# Patient Record
Sex: Female | Born: 1970 | Race: Black or African American | Hispanic: No | Marital: Married | Smoking: Never smoker
Health system: Southern US, Community
[De-identification: ages and names within clinical notes are randomized; demographics above are authoritative.]

## PROBLEM LIST (undated history)

## (undated) DIAGNOSIS — I639 Cerebral infarction, unspecified: Secondary | ICD-10-CM

## (undated) DIAGNOSIS — I10 Essential (primary) hypertension: Secondary | ICD-10-CM

## (undated) DIAGNOSIS — I1A0 Resistant hypertension: Secondary | ICD-10-CM

## (undated) DIAGNOSIS — F329 Major depressive disorder, single episode, unspecified: Secondary | ICD-10-CM

## (undated) DIAGNOSIS — L509 Urticaria, unspecified: Secondary | ICD-10-CM

## (undated) DIAGNOSIS — G8929 Other chronic pain: Secondary | ICD-10-CM

## (undated) DIAGNOSIS — Z8742 Personal history of other diseases of the female genital tract: Secondary | ICD-10-CM

## (undated) DIAGNOSIS — F32A Depression, unspecified: Secondary | ICD-10-CM

## (undated) DIAGNOSIS — D219 Benign neoplasm of connective and other soft tissue, unspecified: Secondary | ICD-10-CM

## (undated) DIAGNOSIS — N839 Noninflammatory disorder of ovary, fallopian tube and broad ligament, unspecified: Secondary | ICD-10-CM

## (undated) DIAGNOSIS — U099 Post covid-19 condition, unspecified: Secondary | ICD-10-CM

## (undated) DIAGNOSIS — E039 Hypothyroidism, unspecified: Secondary | ICD-10-CM

## (undated) DIAGNOSIS — O24419 Gestational diabetes mellitus in pregnancy, unspecified control: Secondary | ICD-10-CM

## (undated) DIAGNOSIS — G43909 Migraine, unspecified, not intractable, without status migrainosus: Secondary | ICD-10-CM

## (undated) DIAGNOSIS — K219 Gastro-esophageal reflux disease without esophagitis: Secondary | ICD-10-CM

## (undated) DIAGNOSIS — T783XXA Angioneurotic edema, initial encounter: Secondary | ICD-10-CM

## (undated) DIAGNOSIS — J45909 Unspecified asthma, uncomplicated: Secondary | ICD-10-CM

## (undated) HISTORY — DX: Unspecified asthma, uncomplicated: J45.909

## (undated) HISTORY — PX: BUNIONECTOMY: SHX129

## (undated) HISTORY — DX: Angioneurotic edema, initial encounter: T78.3XXA

## (undated) HISTORY — DX: Noninflammatory disorder of ovary, fallopian tube and broad ligament, unspecified: N83.9

## (undated) HISTORY — DX: Personal history of other diseases of the female genital tract: Z87.42

## (undated) HISTORY — DX: Resistant hypertension: I1A.0

## (undated) HISTORY — DX: Major depressive disorder, single episode, unspecified: F32.9

## (undated) HISTORY — DX: Depression, unspecified: F32.A

## (undated) HISTORY — DX: Migraine, unspecified, not intractable, without status migrainosus: G43.909

## (undated) HISTORY — DX: Urticaria, unspecified: L50.9

## (undated) HISTORY — DX: Gastro-esophageal reflux disease without esophagitis: K21.9

---

## 1991-03-26 HISTORY — PX: SALPINGECTOMY: SHX328

## 1998-07-12 ENCOUNTER — Ambulatory Visit (HOSPITAL_COMMUNITY): Admission: RE | Admit: 1998-07-12 | Discharge: 1998-07-12 | Payer: Self-pay | Admitting: Family Medicine

## 1998-07-12 ENCOUNTER — Encounter: Payer: Self-pay | Admitting: Family Medicine

## 1999-01-07 ENCOUNTER — Emergency Department (HOSPITAL_COMMUNITY): Admission: EM | Admit: 1999-01-07 | Discharge: 1999-01-07 | Payer: Self-pay | Admitting: Emergency Medicine

## 1999-01-07 ENCOUNTER — Encounter: Payer: Self-pay | Admitting: Emergency Medicine

## 2000-06-15 ENCOUNTER — Emergency Department (HOSPITAL_COMMUNITY): Admission: EM | Admit: 2000-06-15 | Discharge: 2000-06-15 | Payer: Self-pay | Admitting: Emergency Medicine

## 2000-06-16 ENCOUNTER — Encounter: Payer: Self-pay | Admitting: Emergency Medicine

## 2000-06-19 ENCOUNTER — Inpatient Hospital Stay (HOSPITAL_COMMUNITY): Admission: AD | Admit: 2000-06-19 | Discharge: 2000-06-19 | Payer: Self-pay | Admitting: Obstetrics & Gynecology

## 2000-06-23 ENCOUNTER — Other Ambulatory Visit: Admission: RE | Admit: 2000-06-23 | Discharge: 2000-06-23 | Payer: Self-pay | Admitting: Obstetrics & Gynecology

## 2000-09-23 ENCOUNTER — Ambulatory Visit (HOSPITAL_BASED_OUTPATIENT_CLINIC_OR_DEPARTMENT_OTHER): Admission: RE | Admit: 2000-09-23 | Discharge: 2000-09-23 | Payer: Self-pay | Admitting: General Surgery

## 2000-09-23 ENCOUNTER — Encounter (INDEPENDENT_AMBULATORY_CARE_PROVIDER_SITE_OTHER): Payer: Self-pay | Admitting: *Deleted

## 2002-04-20 ENCOUNTER — Emergency Department (HOSPITAL_COMMUNITY): Admission: EM | Admit: 2002-04-20 | Discharge: 2002-04-20 | Payer: Self-pay | Admitting: Emergency Medicine

## 2002-08-07 ENCOUNTER — Encounter: Payer: Self-pay | Admitting: Emergency Medicine

## 2002-08-07 ENCOUNTER — Emergency Department (HOSPITAL_COMMUNITY): Admission: EM | Admit: 2002-08-07 | Discharge: 2002-08-07 | Payer: Self-pay | Admitting: Emergency Medicine

## 2004-03-25 HISTORY — PX: HAND SURGERY: SHX662

## 2005-05-08 ENCOUNTER — Other Ambulatory Visit: Admission: RE | Admit: 2005-05-08 | Discharge: 2005-05-08 | Payer: Self-pay | Admitting: Internal Medicine

## 2006-05-03 ENCOUNTER — Emergency Department (HOSPITAL_COMMUNITY): Admission: EM | Admit: 2006-05-03 | Discharge: 2006-05-03 | Payer: Self-pay | Admitting: Emergency Medicine

## 2006-08-26 ENCOUNTER — Emergency Department (HOSPITAL_COMMUNITY): Admission: EM | Admit: 2006-08-26 | Discharge: 2006-08-26 | Payer: Self-pay | Admitting: Family Medicine

## 2006-10-01 ENCOUNTER — Emergency Department (HOSPITAL_COMMUNITY): Admission: EM | Admit: 2006-10-01 | Discharge: 2006-10-01 | Payer: Self-pay | Admitting: Family Medicine

## 2006-10-06 ENCOUNTER — Emergency Department (HOSPITAL_COMMUNITY): Admission: EM | Admit: 2006-10-06 | Discharge: 2006-10-06 | Payer: Self-pay | Admitting: Family Medicine

## 2006-10-10 ENCOUNTER — Other Ambulatory Visit: Admission: RE | Admit: 2006-10-10 | Discharge: 2006-10-10 | Payer: Self-pay | Admitting: Internal Medicine

## 2006-10-20 ENCOUNTER — Encounter: Admission: RE | Admit: 2006-10-20 | Discharge: 2006-10-20 | Payer: Self-pay | Admitting: Internal Medicine

## 2007-04-23 ENCOUNTER — Emergency Department (HOSPITAL_COMMUNITY): Admission: EM | Admit: 2007-04-23 | Discharge: 2007-04-23 | Payer: Self-pay | Admitting: Emergency Medicine

## 2008-03-29 ENCOUNTER — Ambulatory Visit: Payer: Self-pay | Admitting: Gynecology

## 2008-04-12 ENCOUNTER — Ambulatory Visit: Payer: Self-pay | Admitting: Gynecology

## 2008-07-05 ENCOUNTER — Emergency Department (HOSPITAL_COMMUNITY): Admission: EM | Admit: 2008-07-05 | Discharge: 2008-07-05 | Payer: Self-pay | Admitting: Emergency Medicine

## 2008-12-22 ENCOUNTER — Encounter: Admission: RE | Admit: 2008-12-22 | Discharge: 2008-12-22 | Payer: Self-pay | Admitting: Internal Medicine

## 2009-01-29 ENCOUNTER — Emergency Department (HOSPITAL_COMMUNITY): Admission: EM | Admit: 2009-01-29 | Discharge: 2009-01-29 | Payer: Self-pay | Admitting: Emergency Medicine

## 2009-02-23 ENCOUNTER — Other Ambulatory Visit: Admission: RE | Admit: 2009-02-23 | Discharge: 2009-02-23 | Payer: Self-pay | Admitting: Internal Medicine

## 2009-06-27 ENCOUNTER — Emergency Department (HOSPITAL_COMMUNITY): Admission: EM | Admit: 2009-06-27 | Discharge: 2009-06-27 | Payer: Self-pay | Admitting: Emergency Medicine

## 2009-06-30 ENCOUNTER — Ambulatory Visit (HOSPITAL_BASED_OUTPATIENT_CLINIC_OR_DEPARTMENT_OTHER): Admission: RE | Admit: 2009-06-30 | Discharge: 2009-06-30 | Payer: Self-pay | Admitting: Orthopedic Surgery

## 2010-04-15 ENCOUNTER — Encounter: Payer: Self-pay | Admitting: Internal Medicine

## 2010-06-13 LAB — BASIC METABOLIC PANEL
BUN: 9 mg/dL (ref 6–23)
Calcium: 9.1 mg/dL (ref 8.4–10.5)
GFR calc non Af Amer: >60 mL/min (ref >60)
Potassium: 3.5 mEq/L (ref 3.5–5.1)

## 2010-08-10 NOTE — Op Note (Signed)
[place]  Patient:    April Dyer, April Dyer                          MRN: [id number] Proc. Date: 09/23/00 Adm. Date:  [id number] Attending:  Glenna Fellows Tappan                           Operative Report  PREOPERATIVE DIAGNOSIS:  Right breast mass.  POSTOPERATIVE DIAGNOSIS:  Right breast mass.  PROCEDURE:  Right breast biopsy.  SURGEON:  Lorne Skeens. Hoxworth, M.D.  ANESTHESIA:  Local with IV sedation.  BRIEF HISTORY:  April Dyer is a 40 year old black female who recently presented with a palpable nodule on the lateral breast.  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  Excisional biopsy has been recommended and accepted.  The nature of the procedure, its indications, and risks of bleeding and infection were discussed and understood preoperatively. She is now brought into the operating room for this procedure.  DESCRIPTION OF PROCEDURE:  The patient was brought to the operating room and placed in the supine position on the operating table and IV sedation administered.  The right breast was sterilely prepped and draped.  Local anesthesia was used to infiltrate the skin and underlying soft tissue.  A small incision was made in a skin crease directly over the palpable mass, which was quite superficial, and dissection carried down through the subcutaneous tissue.  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  Hemostasis was obtained with cautery.  The skin was closed with running subcuticular 5-0 Monocryl and Steri-Strips.  Sponge and needle counts were correct.  A dressing was applied and the patient taken to the recovery room good condition. DD:  09/23/00 TD:  09/23/00 Job: [id number]

## 2011-01-08 LAB — POCT URINALYSIS DIP (DEVICE)
Glucose, UA: NEGATIVE
Hgb urine dipstick: NEGATIVE
Nitrite: NEGATIVE
Protein, ur: NEGATIVE
Urobilinogen, UA: 0.2
pH: 5.5

## 2011-01-08 LAB — WET PREP, GENITAL
Trich, Wet Prep: NONE SEEN
Yeast Wet Prep HPF POC: NONE SEEN

## 2011-01-08 LAB — POCT PREGNANCY, URINE

## 2011-01-10 LAB — HEPATIC FUNCTION PANEL
ALT: 13
AST: 20
Indirect Bilirubin: 0.3
Total Protein: 6.7

## 2011-01-10 LAB — URINALYSIS, ROUTINE W REFLEX MICROSCOPIC
Glucose, UA: NEGATIVE
Ketones, ur: NEGATIVE
Nitrite: NEGATIVE
Protein, ur: NEGATIVE
pH: 5.5

## 2011-01-10 LAB — BASIC METABOLIC PANEL
Calcium: 9.2
GFR calc non Af Amer: >60
Potassium: 3.7
Sodium: 139

## 2011-01-10 LAB — GAMMA GT: GGT: 16

## 2011-01-10 LAB — ETHANOL

## 2011-01-10 LAB — POCT CARDIAC MARKERS
Operator id: 146091
Troponin i, poc: <0.05

## 2011-01-10 LAB — DRUG SCREEN PANEL (SERUM)
Benzodiazepine Scrn: NEGATIVE
Cocaine (Metabolite): NEGATIVE

## 2011-10-07 ENCOUNTER — Encounter (HOSPITAL_COMMUNITY): Payer: Self-pay | Admitting: *Deleted

## 2011-10-07 ENCOUNTER — Emergency Department (HOSPITAL_COMMUNITY)
Admission: EM | Admit: 2011-10-07 | Discharge: 2011-10-08 | Disposition: A | Discharge status: Home / Self Care | Payer: Self-pay | Attending: Emergency Medicine | Admitting: Emergency Medicine

## 2011-10-07 DIAGNOSIS — I10 Essential (primary) hypertension: Secondary | ICD-10-CM | POA: Insufficient documentation

## 2011-10-07 DIAGNOSIS — R0781 Pleurodynia: Secondary | ICD-10-CM

## 2011-10-07 DIAGNOSIS — R079 Chest pain, unspecified: Secondary | ICD-10-CM | POA: Insufficient documentation

## 2011-10-07 DIAGNOSIS — Z79899 Other long term (current) drug therapy: Secondary | ICD-10-CM | POA: Insufficient documentation

## 2011-10-07 HISTORY — DX: Essential (primary) hypertension: I10

## 2011-10-07 LAB — COMPREHENSIVE METABOLIC PANEL
ALT: 13 U/L (ref 0–35)
BUN: 14 mg/dL (ref 6–23)
CO2: 24 mEq/L (ref 19–32)
Calcium: 9.4 mg/dL (ref 8.4–10.5)
Creatinine, Ser: 0.89 mg/dL (ref 0.50–1.10)
GFR calc Af Amer: >90 mL/min (ref >90)
GFR calc non Af Amer: 79 mL/min — ABNORMAL LOW (ref >90)
Glucose, Bld: 89 mg/dL (ref 70–99)
Total Protein: 7.6 g/dL (ref 6.0–8.3)

## 2011-10-07 LAB — CBC
HCT: 36.9 % (ref 36.0–46.0)
Hemoglobin: 12.7 g/dL (ref 12.0–15.0)
MCH: 33 pg (ref 26.0–34.0)
MCHC: 34.4 g/dL (ref 30.0–36.0)
MCV: 95.8 fL (ref 78.0–100.0)
RBC: 3.85 MIL/uL — ABNORMAL LOW (ref 3.87–5.11)

## 2011-10-07 LAB — URINALYSIS, ROUTINE W REFLEX MICROSCOPIC
Bilirubin Urine: NEGATIVE
Hgb urine dipstick: NEGATIVE
Ketones, ur: NEGATIVE mg/dL
Nitrite: NEGATIVE
Specific Gravity, Urine: 1.018 (ref 1.005–1.030)
pH: 6 (ref 5.0–8.0)

## 2011-10-07 MED ORDER — SODIUM CHLORIDE 0.9 % IV BOLUS (SEPSIS)
1000.0000 mL | Freq: Once | INTRAVENOUS | Status: AC
  Administered 2011-10-08: 1000 mL via INTRAVENOUS

## 2011-10-07 NOTE — ED Notes (Signed)
Patient has been dizzy for about a week and her arms are tingling.  Patient also complains of abdominal for about three months (upper abdomen) and it has been getting worse.  Patient's had a bowel movement a few days ago.  Patient feels bloated and her abdomen is distended than normal

## 2011-10-08 ENCOUNTER — Emergency Department (HOSPITAL_COMMUNITY): Payer: Self-pay

## 2011-10-08 MED ORDER — MORPHINE SULFATE 4 MG/ML IJ SOLN
4.0000 mg | Freq: Once | INTRAMUSCULAR | Status: AC
  Administered 2011-10-08: 4 mg via INTRAVENOUS
  Filled 2011-10-08: qty 1

## 2011-10-08 MED ORDER — KETOROLAC TROMETHAMINE 30 MG/ML IJ SOLN
30.0000 mg | Freq: Once | INTRAMUSCULAR | Status: AC
  Administered 2011-10-08: 30 mg via INTRAVENOUS
  Filled 2011-10-08: qty 1

## 2011-10-08 MED ORDER — PREDNISONE 50 MG PO TABS: 50.0000 mg | ORAL_TABLET | Freq: Every day | ORAL | Status: AC

## 2011-10-08 MED ORDER — HYDROCODONE-ACETAMINOPHEN 5-325 MG PO TABS: 1.0000 | ORAL_TABLET | Freq: Four times a day (QID) | ORAL | Status: AC | PRN

## 2011-10-08 NOTE — ED Provider Notes (Signed)
History     CSN: [id number]  Arrival date & time 10/07/11  2151   First MD Initiated Contact with Patient 10/07/11 2350      No chief complaint on file.   (Consider location/radiation/quality/duration/timing/severity/associated sxs/prior treatment) HPI Recent presents emergency Dept. with rib pain and back pain for the last 3 months.  Patient, states, that it is not her abdomen it is actually her ribs.  Patient, states that she had some tingling in her arm and foot yesterday.  Patient also, states she has had some mild dizziness at times over the last few weeks.  She denies, shortness of breath, chest pain, nausea, vomiting, diarrhea, fever, cough, syncope, or visual changes.  Past Medical History  Diagnosis Date  . Hypertension     History reviewed. No pertinent past surgical history.  History reviewed. No pertinent family history.  History  Substance Use Topics  . Smoking status: Never Smoker   . Smokeless tobacco: Not on file  . Alcohol Use: No    OB History    Grav Para Term Preterm Abortions TAB SAB Ect Mult Living                  Review of Systems All other systems negative except as documented in the HPI. All pertinent positives and negatives as reviewed in the HPI.  Allergies  Review of patient's allergies indicates no known allergies.  Home Medications   Current Outpatient Rx  Name Route Sig Dispense Refill  . AMLODIPINE BESYLATE 10 MG PO TABS Oral Take 10 mg by mouth daily.    Marland Kitchen BAYER BACK & BODY PAIN EX ST PO Oral Take 2 tablets by mouth as needed. For pain    . HYDROCHLOROTHIAZIDE 25 MG PO TABS Oral Take 25 mg by mouth daily.    Marland Kitchen PRENATAL 27-0.8 MG PO TABS Oral Take 1 tablet by mouth daily.      BP 161/102  Pulse 72  Temp 98.3 F (36.8 C) (Oral)  Resp 16  SpO2 98%  LMP 10/05/2011  Physical Exam  Nursing note and vitals reviewed. Constitutional: She appears well-developed and well-nourished.  HENT:  Head: Normocephalic and atraumatic.    Mouth/Throat: Oropharynx is clear and moist.  Eyes: Pupils are equal, round, and reactive to light.  Cardiovascular: Normal rate and regular rhythm.  Exam reveals no gallop and no friction rub.   No murmur heard. Pulmonary/Chest: Effort normal and breath sounds normal. No respiratory distress. She exhibits tenderness.    Abdominal: Soft. Bowel sounds are normal. She exhibits no distension. There is no tenderness. There is no rebound and no guarding.  Skin: Skin is warm and dry.    ED Course  Procedures (including critical care time)  Labs Reviewed  CBC - Abnormal; Notable for the following:    RBC 3.85 (*)     All other components within normal limits  COMPREHENSIVE METABOLIC PANEL - Abnormal; Notable for the following:    Potassium 3.3 (*)     Total Bilirubin 0.2 (*)     GFR calc non Af Amer 79 (*)     All other components within normal limits  URINALYSIS, ROUTINE W REFLEX MICROSCOPIC  POCT PREGNANCY, URINE   The patient will be treated for chest wall discomfort based on her bilateral rib pain. The patient is advised to return here as needed. Follow up with her doctor for a recheck. The patient has not have abdominal pain here.    MDM  MDM Reviewed: nursing note,  vitals and previous chart Interpretation: labs and x-ray            Carlyle Dolly, PA-C 10/08/11 0059

## 2011-10-10 NOTE — ED Provider Notes (Signed)
Medical screening examination/treatment/procedure(s) were performed by non-physician practitioner and as supervising physician I was immediately available for consultation/collaboration.   Shelda Jakes, MD 10/10/11 2156

## 2011-11-04 ENCOUNTER — Ambulatory Visit (INDEPENDENT_AMBULATORY_CARE_PROVIDER_SITE_OTHER): Payer: Self-pay | Admitting: Family Medicine

## 2011-11-04 ENCOUNTER — Encounter: Payer: Self-pay | Admitting: Family Medicine

## 2011-11-04 VITALS — BP 142/96 | HR 85 | Temp 97.6°F | Ht 67.5 in | Wt 196.0 lb

## 2011-11-04 DIAGNOSIS — F32A Depression, unspecified: Secondary | ICD-10-CM | POA: Insufficient documentation

## 2011-11-04 DIAGNOSIS — G43909 Migraine, unspecified, not intractable, without status migrainosus: Secondary | ICD-10-CM

## 2011-11-04 DIAGNOSIS — E669 Obesity, unspecified: Secondary | ICD-10-CM

## 2011-11-04 DIAGNOSIS — K219 Gastro-esophageal reflux disease without esophagitis: Secondary | ICD-10-CM

## 2011-11-04 DIAGNOSIS — I10 Essential (primary) hypertension: Secondary | ICD-10-CM

## 2011-11-04 DIAGNOSIS — R0781 Pleurodynia: Secondary | ICD-10-CM

## 2011-11-04 DIAGNOSIS — N839 Noninflammatory disorder of ovary, fallopian tube and broad ligament, unspecified: Secondary | ICD-10-CM

## 2011-11-04 DIAGNOSIS — I1A0 Resistant hypertension: Secondary | ICD-10-CM | POA: Insufficient documentation

## 2011-11-04 DIAGNOSIS — M545 Low back pain, unspecified: Secondary | ICD-10-CM

## 2011-11-04 DIAGNOSIS — Z8742 Personal history of other diseases of the female genital tract: Secondary | ICD-10-CM

## 2011-11-04 DIAGNOSIS — Z87898 Personal history of other specified conditions: Secondary | ICD-10-CM | POA: Insufficient documentation

## 2011-11-04 DIAGNOSIS — R079 Chest pain, unspecified: Secondary | ICD-10-CM

## 2011-11-04 DIAGNOSIS — F329 Major depressive disorder, single episode, unspecified: Secondary | ICD-10-CM

## 2011-11-04 DIAGNOSIS — F3289 Other specified depressive episodes: Secondary | ICD-10-CM

## 2011-11-04 NOTE — Assessment & Plan Note (Signed)
Given history, i have concerns may be bipolar. Will complete comprehensive pscyh eval in 2 weeks. Will obtain records from previous PCP. Patient not interested in SSRI and do not think this would be safe to start without more comprehensive eval. Suspect patient will either need psychiatry or psychology eval as well.

## 2011-11-04 NOTE — Assessment & Plan Note (Signed)
Trial sports bra and icing BID daily for 1-2 months. Normal CXR within a month with no fractures noted. Continue to follow. May be weight related.

## 2011-11-04 NOTE — Patient Instructions (Signed)
Dear April Dyer,   It was great to meet you today. Thank you for coming to clinic. Please read below regarding the issues that we discussed.   1. For your rib pain, I want you to go back to wearing sports bras for at least 3 months. I also want you to try icing and/or heat at minimum twice a day. Since you are trying to get pregnant, using tylenol instead of NSAIDS.  2. For your back pain, you have set a goal to lose some weight. You set a goal of 5 lbs in 2 months. Our main obstacle is likely your slowed metabolic rate from eating so infrequently. I would like to see you back in 2 weeks to talk about your psychiatric history and see if we need to refer you.   My 5 to Fitness!  5: fruits and vegetables per day (work on 9 per day if you are at 5) 4: exercise 4-5 times per week for at least 30 minutes (walking counts!) 3: meals per day (don't skip breakfast!) 2: habits to quit  -smoking  -excess alcohol use (men >2 beer/day; women >1beer/day) 1: sweet per day (2 cookies, 1 small cup of ice cream, 12 oz soda)  These are general tips for healthy living. Try to start with 1 or 2 habit TODAY and make it a part of your life for several months. You set a goal today to work on: Eating 3 meals per day.   Once you have 1 or 2 habits down for several months, try to begin working on your next healthy habit. With every single step you take, you will be leading a healthier lifestyle!   Please call earlier if you have any questions or concerns.   Sincerely,  Dr. Tana Conch

## 2011-11-04 NOTE — Assessment & Plan Note (Signed)
Need to obtain records and ensure proper follow up.

## 2011-11-04 NOTE — Assessment & Plan Note (Signed)
Slowed metabolic rate due to poor PO appetite. May be related to depression/psych history-eval next visit. Strongly advised 3 meals a day-focused on trying to get patient to at least eat breakfast. Will likely need nutrition consult at next 1-2 visits. Goal weight loss 5 lbs in 2 months.

## 2011-11-04 NOTE — Assessment & Plan Note (Signed)
Obtain records. Encouraged patient to continue sexual activity for at least one year before more advanced assessment. Would consider TSH for fertility given obesity despite rigorous exercise.

## 2011-11-04 NOTE — Assessment & Plan Note (Signed)
MSK in nature. Advised weight loss per obesity section. Tylenol instead of bayer back as trying to get pregnant.
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
## 2011-11-05 ENCOUNTER — Encounter: Payer: Self-pay | Admitting: Family Medicine

## 2011-11-05 DIAGNOSIS — D259 Leiomyoma of uterus, unspecified: Secondary | ICD-10-CM | POA: Insufficient documentation

## 2011-11-05 DIAGNOSIS — N979 Female infertility, unspecified: Secondary | ICD-10-CM | POA: Insufficient documentation

## 2011-11-24 ENCOUNTER — Encounter: Payer: Self-pay | Admitting: Family Medicine

## 2011-11-24 DIAGNOSIS — N6310 Unspecified lump in the right breast, unspecified quadrant: Secondary | ICD-10-CM

## 2011-11-24 DIAGNOSIS — N6315 Unspecified lump in the right breast, overlapping quadrants: Secondary | ICD-10-CM | POA: Insufficient documentation

## 2011-12-02 ENCOUNTER — Ambulatory Visit: Payer: Self-pay | Admitting: Family Medicine

## 2011-12-02 ENCOUNTER — Encounter: Payer: Self-pay | Admitting: Family Medicine

## 2011-12-03 ENCOUNTER — Encounter: Payer: Self-pay | Admitting: Family Medicine

## 2011-12-03 ENCOUNTER — Ambulatory Visit (INDEPENDENT_AMBULATORY_CARE_PROVIDER_SITE_OTHER): Payer: Self-pay | Admitting: Family Medicine

## 2011-12-03 VITALS — BP 142/98 | HR 78 | Temp 98.0°F | Ht 67.5 in | Wt 198.0 lb

## 2011-12-03 DIAGNOSIS — R140 Abdominal distension (gaseous): Secondary | ICD-10-CM

## 2011-12-03 DIAGNOSIS — R141 Gas pain: Secondary | ICD-10-CM

## 2011-12-03 DIAGNOSIS — R5383 Other fatigue: Secondary | ICD-10-CM

## 2011-12-03 DIAGNOSIS — R5381 Other malaise: Secondary | ICD-10-CM

## 2011-12-03 DIAGNOSIS — R143 Flatulence: Secondary | ICD-10-CM

## 2011-12-03 NOTE — Patient Instructions (Addendum)
-  Try yogurt daily -Try to eat something (even a few bites) every few hours -Stay hydrated (even if it is a few sips every hour)  Follow-up in 2 weeks with Dr. Durene Cal

## 2011-12-03 NOTE — Progress Notes (Signed)
  Subjective:    Patient ID: April Dyer, female    DOB: [birth year], 41 y.o.   MRN: [id number]  HPI # Work-in visit: bloating, fatigue, difficulty sleeping, decreased appetite This has been going on for several months. She has been under a lot of stress (planning a wedding for April 2014, trying to get pregnant since since is of advanced age, going to school, working at a job she does not like).  She has a family history of thyroid disease (hyper and hypo). She was tested in the past but it was normal at that time.  She denies depression (feeling sad or hopeless). She denies anxiety.  It is very difficult for her to eat 3 times a day. She does not like to drink that much water either.   Review of Systems Denies chest pain Denies fevers, chills Denies abdominal pain, constipation. She has loose stools every 2-3 days. Denies blood in stool.  Denies emesis. She has nausea sometimes.   Allergies, medication, past medical history reviewed.  Significant for: -HTN -GERD -Migraines, rib pain, low back pain, depression -History of PID, fallopian tube disorder, abnormal Pap -Obesity     Objective:   Physical Exam GEN: NAD; overweight PSYCH: appears anxious; appropriate to questions ORAL: dry MM NECK: no thyromegaly or nodules palpable CV: RRR, no m/r/g PULM: NI WOB; CTAB ABD: NABS, soft, NT, distended versus obese EXT: mild non-pitting left pedal edema    Assessment & Plan:

## 2011-12-03 NOTE — Assessment & Plan Note (Signed)
She eats infrequently and does not seem to eat healthy when she does. She is also dehydrated.  -Encouraged eating something healthy every few hours, even if she only took bites -Try yogurt to see if it helps with the bloating -Encouraged hydration

## 2011-12-03 NOTE — Assessment & Plan Note (Signed)
-  Patient left, and I did not order TSH/free T4>>>Called patient and asked her to schedule a lab visit to have this checked.  -See above regarding "abdominal bloating". I think eating regularly will give her more energy. -Discussed starting an anti-depressant for her symptoms, although she does not have obvious depression, to see if it may help her insomnia/decreased appetite/fatgiue, however, patient declines at this time.  -Discussed ways to better manage her stress, including focusing on what is most important to her and letting other thing go.  -Follow-up with PCP in 2 weeks.

## 2011-12-10 ENCOUNTER — Other Ambulatory Visit: Payer: Self-pay

## 2011-12-10 ENCOUNTER — Ambulatory Visit: Payer: Self-pay | Admitting: Family Medicine

## 2011-12-10 DIAGNOSIS — R5383 Other fatigue: Secondary | ICD-10-CM

## 2011-12-10 LAB — T4, FREE: Free T4: 0.95 ng/dL (ref 0.80–1.80)

## 2011-12-10 LAB — TSH: TSH: 3.386 u[IU]/mL (ref 0.350–4.500)

## 2011-12-10 NOTE — Progress Notes (Signed)
TSH AND F-T4 DONE TODAY [name] 

## 2011-12-11 ENCOUNTER — Encounter: Payer: Self-pay | Admitting: Family Medicine

## 2011-12-17 ENCOUNTER — Encounter: Payer: Self-pay | Admitting: Family Medicine

## 2011-12-17 ENCOUNTER — Ambulatory Visit (INDEPENDENT_AMBULATORY_CARE_PROVIDER_SITE_OTHER): Payer: Self-pay | Admitting: Family Medicine

## 2011-12-17 VITALS — BP 129/88 | HR 89 | Temp 97.8°F | Ht 67.5 in | Wt 200.0 lb

## 2011-12-17 DIAGNOSIS — R079 Chest pain, unspecified: Secondary | ICD-10-CM

## 2011-12-17 DIAGNOSIS — L280 Lichen simplex chronicus: Secondary | ICD-10-CM | POA: Insufficient documentation

## 2011-12-17 DIAGNOSIS — R143 Flatulence: Secondary | ICD-10-CM

## 2011-12-17 DIAGNOSIS — R140 Abdominal distension (gaseous): Secondary | ICD-10-CM

## 2011-12-17 DIAGNOSIS — R0781 Pleurodynia: Secondary | ICD-10-CM

## 2011-12-17 DIAGNOSIS — R141 Gas pain: Secondary | ICD-10-CM

## 2011-12-17 MED ORDER — TRIAMCINOLONE ACETONIDE 0.1 % EX CREA: TOPICAL_CREAM | Freq: Two times a day (BID) | CUTANEOUS | Status: DC

## 2011-12-17 MED ORDER — POLYETHYLENE GLYCOL 3350 17 GM/SCOOP PO POWD: 17.0000 g | Freq: Every day | ORAL | Status: DC

## 2011-12-17 NOTE — Progress Notes (Signed)
Subjective:   1. Bloating-patient states she has cut out milk and started eating yogurt as Dr. Bluford Kaufmann park suggested. She attempted to eat 3 meals a day for 3 days and states she had more energy but just lost appetite even with small meals so she stopped. A week later is when she came in with worsening bloating. She states she cannot tolerate eating regular meals. She has been doing a better job of staying hydrated and eating small snacks such as grapes/strawberries throughout the day. She is getting full after eating 1/4 of a sub at this point and says overall appetite is low.   Patient states only having bowel movement every 3 days when used to be regular everyday. She has some concern that she may be constipated.   2. Rib pain-She feels like when she bloats that it makes the pain underneath her ribs worse. She has been using a sports bra with some slight improvement compared to under wire bra. Ice has given temporary relief and helped her with sleep btu she fell asleep with ice on so does not want to use that regularly.   3. Rash between breasts-patient not sure when this started but first noticed it within the last month. Some slightly darker areas that she has scratched at constantly  "trying to scratch it off" which has only made it worse. Says not necessarily itchy but she just wants to scratch it. No fever, new medications, oral involvement, overall sick appearance.   ROS--See HPI  Past Medical History-smoking status noted: nonsmoker.  Reviewed problem list.  Medications- reviewed and updated Chief complaint-noted  Objective: BP 129/88  Pulse 89  Temp 97.8 F (36.6 C) (Oral)  Ht 5' 7.5" (1.715 m)  Wt 200 lb (90.719 kg)  BMI 30.86 kg/m2  LMP 11/21/2011 Gen: NAD, alert and oriented x 3 Heent: MMM, PERRLA CV: RRR no mrg Lungs: CTAB Abd: soft nondistended. Slight tenderness in LLQ.  MSK: tender in Paraspinous muscles, normal range of motion. Patient extremely tender to palpation of  bottom 2 ribs bilaterally.  Skin-hyperpigmented raised scaly excoriated lesion between breasts and underneath left and right breast.  Assessment/Plan: See problem oriented charted  Patient needs to follow up very soon for repeat PAP smear with reflex testing-encouraged her to schedule appointment.

## 2011-12-17 NOTE — Assessment & Plan Note (Signed)
Concern for constipation as cause of bloating although irregular eating pattern could certainly be cause. Will trial Miralax and titrate up to 3x per day for daily BM to see if this improves pain.   Given change in bowel habits, would strongly consider colonoscopy. Unfortunately, patient has orange card so cannot get at this time unless pays out of pocket.

## 2011-12-17 NOTE — Assessment & Plan Note (Signed)
Patient thinks bloating makes this worse. Overall slightly better with sports bra. Will continue. Do not think bloating contributing but will trial helping through relieving possible constipation.

## 2011-12-17 NOTE — Patient Instructions (Addendum)
For the rash between your breasts, STOP SRcATHING IT!  Use the triamcinolone cream twice a day for at least 2 weeks. If it gets worse with the cream, please come back.   For your bloating which you think is associated with your rib pain and possibly constipation, let's try Miralax daily and increase as needed up to 3x to have a daily bowel movement.   Please schedule an appointment for a pap smear as soon as possible.   Please also consider an appointment to talk about your sleep issues and possible depression.

## 2011-12-17 NOTE — Assessment & Plan Note (Signed)
Encouraged patient to STOP scratching area. WIll trial triamcinolone as well.

## 2011-12-19 ENCOUNTER — Encounter: Payer: Self-pay | Admitting: Family Medicine

## 2011-12-19 ENCOUNTER — Ambulatory Visit (INDEPENDENT_AMBULATORY_CARE_PROVIDER_SITE_OTHER): Payer: Self-pay | Admitting: Family Medicine

## 2011-12-19 ENCOUNTER — Other Ambulatory Visit (HOSPITAL_COMMUNITY)
Admission: RE | Admit: 2011-12-19 | Discharge: 2011-12-19 | Disposition: A | Discharge status: Home / Self Care | Payer: Self-pay | Source: Ambulatory Visit | Attending: Family Medicine | Admitting: Family Medicine

## 2011-12-19 VITALS — BP 113/63 | HR 81 | Temp 98.1°F | Ht 67.5 in | Wt 203.0 lb

## 2011-12-19 DIAGNOSIS — L280 Lichen simplex chronicus: Secondary | ICD-10-CM

## 2011-12-19 DIAGNOSIS — Z23 Encounter for immunization: Secondary | ICD-10-CM

## 2011-12-19 DIAGNOSIS — Z0000 Encounter for general adult medical examination without abnormal findings: Secondary | ICD-10-CM

## 2011-12-19 DIAGNOSIS — Z01419 Encounter for gynecological examination (general) (routine) without abnormal findings: Secondary | ICD-10-CM | POA: Insufficient documentation

## 2011-12-19 DIAGNOSIS — Z87898 Personal history of other specified conditions: Secondary | ICD-10-CM

## 2011-12-19 DIAGNOSIS — Z1151 Encounter for screening for human papillomavirus (HPV): Secondary | ICD-10-CM | POA: Insufficient documentation

## 2011-12-19 DIAGNOSIS — Z124 Encounter for screening for malignant neoplasm of cervix: Secondary | ICD-10-CM

## 2011-12-19 DIAGNOSIS — Z8742 Personal history of other diseases of the female genital tract: Secondary | ICD-10-CM

## 2011-12-19 NOTE — Assessment & Plan Note (Signed)
Improving, continue triamcinolone

## 2011-12-19 NOTE — Assessment & Plan Note (Signed)
GIven TDAP today Discussed mammogram options and USPTF recommendations of starting at age 43 and doing every other year as well as other options. Patient opted to do every other year screening starting now with consideration of yearly after age 60.  Also as patient recently stopped drinking milk, encouraged supplement with MV with calcium and vitamin D Discussed STD testing but patient had at HD within 6 months and wants to defer until next CPE

## 2011-12-19 NOTE — Patient Instructions (Signed)
Thanks for coming in for your physical! I will either call or send a letter with results from your pap.  I would encourage you to take something with calcium and vitamin d since you are avoiding milk now. You could also strongly consider something with folic acid if you continue to try to get pregnant.  You deferred STD testing until your next physical.  Make sure to call to get your mammogram.  You got your TDAP today.   Thanks,  Dr. Durene Cal  My 5 to Fitness!  5: fruits and vegetables per day (work on 9 per day if you are at 5) 4: exercise 4-5 times per week for at least 30 minutes (walking counts!) 3: meals per day (don't skip breakfast!) 2: habits to quit -smoking -excess alcohol use (men >2 beer/day; women >1beer/day) 1: sweet per day (2 cookies, 1 small cup of ice cream, 12 oz soda)  These are general tips for healthy living. Try to start with 1 or 2 habit TODAY and make it a part of your life for several months.   Once you have 1 or 2 habits down for several months, try to begin working on your next healthy habit. With every single step you take, you will be leading a healthier lifestyle!

## 2011-12-19 NOTE — Assessment & Plan Note (Signed)
PAP smear today with cotest by nonreflex HPV testing. If ASCUS or less with HPV negative will repeat in 1 year. IF still ASCUS with HPV negative in 1 year, will repeat colpo in [facility] or refer to women's clinic. IF greater than ASCUS or HPV positive, [facility] colpo or women's clinic immediate referral.

## 2011-12-19 NOTE — Progress Notes (Signed)
Subjective:  Preventative Visit/CPE  1. History of abnormal pap-Managed at [facility] previously but has lost insurance and now on orange card. History of ASCUS 2010 with negative HPV. 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 Planned for follow up in 6 months presents today for pap.   2. Preventative- GIven TDAP today Discussed mammogram options and USPTF recommendations of starting at age 43 and doing every other year as well as other options. Patient opted to do every other year screening starting now with consideration of yearly after age 74.  Also as patient recently stopped drinking milk, encouraged supplement with MV with calcium and vitamin D Discussed STD testing but patient had at [facility] within 6 months and wants to defer until next CPE  3. Infertility-discussed increasing likelihood of infertility as well as increasing rates of Down's Syndrome. Patient still actively trying to pursue pregnancy for now. Encouraged folic acid while trying. Been trying for 9 months yet.   4. 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   ROS--See HPI  Past Medical History-smoking status noted: nonsmoker.  Reviewed problem list.  Medications- reviewed and updated Chief complaint-noted  Objective: BP 113/63  Pulse 81  Temp 98.1 F (36.7 C) (Oral)  Ht 5' 7.5" (1.715 m)  Wt 203 lb (92.08 kg)  BMI 31.32 kg/m2  LMP 11/15/2011 Gen: NAD CV: RRR no mrg Lungs: CTAB Pelvic: cervix normal in appearance, external genitalia normal, no adnexal masses or tenderness, no cervical motion tenderness, uterus normal size, shape, and consistency and vagina normal without discharge Skin: hyperpigmentation and scale in between breasts lessened in severity.   Assessment/Plan: See problem oriented charted

## 2011-12-31 ENCOUNTER — Encounter: Payer: Self-pay | Admitting: *Deleted

## 2011-12-31 ENCOUNTER — Encounter: Payer: Self-pay | Admitting: Family Medicine

## 2012-03-25 HISTORY — PX: MYOMECTOMY: SHX85

## 2012-07-02 ENCOUNTER — Encounter: Payer: Self-pay | Admitting: Obstetrics & Gynecology

## 2012-09-14 ENCOUNTER — Encounter: Payer: Self-pay | Admitting: Obstetrics & Gynecology

## 2012-09-14 ENCOUNTER — Ambulatory Visit (INDEPENDENT_AMBULATORY_CARE_PROVIDER_SITE_OTHER): Payer: BC Managed Care – PPO | Admitting: Obstetrics & Gynecology

## 2012-09-14 VITALS — BP 152/94 | HR 91 | Temp 98.2°F | Ht 67.0 in | Wt 208.2 lb

## 2012-09-14 DIAGNOSIS — N839 Noninflammatory disorder of ovary, fallopian tube and broad ligament, unspecified: Secondary | ICD-10-CM

## 2012-09-14 DIAGNOSIS — D259 Leiomyoma of uterus, unspecified: Secondary | ICD-10-CM

## 2012-09-14 DIAGNOSIS — N979 Female infertility, unspecified: Secondary | ICD-10-CM

## 2012-09-14 DIAGNOSIS — F329 Major depressive disorder, single episode, unspecified: Secondary | ICD-10-CM

## 2012-09-14 DIAGNOSIS — Z3202 Encounter for pregnancy test, result negative: Secondary | ICD-10-CM

## 2012-09-14 DIAGNOSIS — F32A Depression, unspecified: Secondary | ICD-10-CM

## 2012-09-14 DIAGNOSIS — Z8742 Personal history of other diseases of the female genital tract: Secondary | ICD-10-CM

## 2012-09-14 DIAGNOSIS — F3289 Other specified depressive episodes: Secondary | ICD-10-CM

## 2012-09-14 NOTE — Progress Notes (Signed)
.   Subjective:     April Dyer is a 42 y.o. female here for a problem exam.  Current complaints: Pelvic pain. 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  She has severe pain around the time she ovulates.   Personal health questionnaire reviewed: no.  See pelvic pain questionnaire. Gynecologic History Patient's last menstrual period was 08/31/2012. Contraception: none Last Pap: 2014. Results were: normal Last mammogram: 2012. Results were: normal  Obstetric History OB History   Grav Para Term Preterm Abortions TAB SAB Ect Mult Living                   The following portions of the patient's history were reviewed and updated as appropriate: allergies, current medications, past family history, past medical history, past social history, past surgical history and problem list.  Review of Systems Pertinent items are noted in HPI.    Objective:    General appearance: alert Breasts: normal appearance, no masses or tenderness Abdomen: soft, non-tender; bowel sounds normal; no masses,  no organomegaly Pelvic: cervix normal in appearance, external genitalia normal, no adnexal masses or tenderness, uterus mildly enlarged, irregular shape--right LUS myoma and vagina normal without discharge      Assessment:   Pelvic pain Secondary infertility  Plan:   Return after the pelvic U/S Management options reviewed

## 2012-09-14 NOTE — Patient Instructions (Addendum)
Pelvic Pain Pelvic pain is pain below the belly button and located between your hips. Acute pain may last a few hours or days. Chronic pelvic pain may last weeks and months. The cause may be different for different types of pain. The pain may be dull or sharp, mild or severe and can interfere with your daily activities. Write down and tell your caregiver:   Exactly where the pain is located.  If it comes and goes or is there all the time.  When it happens (with sex, urination, bowel movement, etc.)  If the pain is related to your menstrual period or stress. Your caregiver will take a full history and do a complete physical exam and Pap test. CAUSES   Painful menstrual periods (dysmenorrhea).  Normal ovulation (Mittelschmertz) that occurs in the middle of the menstrual cycle every month.  The pelvic organs get engorged with blood just before the menstrual period (pelvic congestive syndrome).  Scar tissue from an infection or past surgery (pelvic adhesions).  Cancer of the female pelvic organs. When there is pain with cancer, it has been there for a long time.  The lining of the uterus (endometrium) abnormally grows in places like the pelvis and on the pelvic organs (endometriosis).  A form of endometriosis with the lining of the uterus present inside of the muscle tissue of the uterus (adenomyosis).  Fibroid tumor (noncancerous) in the uterus.  Bladder problems such as infection, bladder spasms of the muscle tissue of the bladder.  Intestinal problems (irritable bowel syndrome, colitis, an ulcer or gastrointestinal infection).  Polyps of the cervix or uterus.  Pregnancy in the tube (ectopic pregnancy).  The opening of the cervix is too small for the menstrual blood to flow through it (cervical stenosis).  Physical or sexual abuse (past or present).  Musculo-skeletal problems from poor posture, problems with the vertebrae of the lower back or the uterine pelvic muscles falling  (prolapse).  Psychological problems such as depression or stress.  IUD (intrauterine device) in the uterus. DIAGNOSIS  Tests to make a diagnosis depends on the type, location, severity and what causes the pain to occur. Tests that may be needed include:  Blood tests.  Urine tests  Ultrasound.  X-rays.  CT Scan.  MRI.  Laparoscopy.  Major surgery. TREATMENT  Treatment will depend on the cause of the pain, which includes:  Prescription or over-the-counter pain medication.  Antibiotics.  Birth control pills.  Hormone treatment.  Nerve blocking injections.  Physical therapy.  Antidepressants.  Counseling with a psychiatrist or psychologist.  Minor or major surgery. HOME CARE INSTRUCTIONS   Only take over-the-counter or prescription medicines for pain, discomfort or fever as directed by your caregiver.  Follow your caregiver's advice to treat your pain.  Rest.  Avoid sexual intercourse if it causes the pain.  Apply warm or cold compresses (which ever works best) to the pain area.  Do relaxation exercises such as yoga or meditation.  Try acupuncture.  Avoid stressful situations.  Try group therapy.  If the pain is because of a stomach/intestinal upset, drink clear liquids, eat a bland light food diet until the symptoms go away. SEEK MEDICAL CARE IF:   You need stronger prescription pain medication.  You develop pain with sexual intercourse.  You have pain with urination.  You develop a temperature of 102 F (38.9 C) with the pain.  You are still in pain after 4 hours of taking prescription medication for the pain.  You need depression medication.    Your IUD is causing pain and you want it removed. SEEK IMMEDIATE MEDICAL CARE IF:  You develop very severe pain or tenderness.  You faint, have chills, severe weakness or dehydration.  You develop heavy vaginal bleeding or passing solid tissue.  You develop a temperature of 102 F (38.9 C)  with the pain.  You have blood in the urine.  You are being physically or sexually abused.  You have uncontrolled vomiting and diarrhea.  You are depressed and afraid of harming yourself or someone else. Document Released: 04/18/2004 Document Revised: 06/03/2011 Document Reviewed: 01/14/2008 ExitCare Patient Information 2013 [employer].  

## 2012-09-17 ENCOUNTER — Other Ambulatory Visit: Payer: Self-pay | Admitting: *Deleted

## 2012-09-17 DIAGNOSIS — N949 Unspecified condition associated with female genital organs and menstrual cycle: Secondary | ICD-10-CM

## 2012-09-17 DIAGNOSIS — D259 Leiomyoma of uterus, unspecified: Secondary | ICD-10-CM

## 2012-09-22 ENCOUNTER — Ambulatory Visit (INDEPENDENT_AMBULATORY_CARE_PROVIDER_SITE_OTHER): Payer: BC Managed Care – PPO

## 2012-09-22 DIAGNOSIS — N938 Other specified abnormal uterine and vaginal bleeding: Secondary | ICD-10-CM

## 2012-09-22 DIAGNOSIS — D259 Leiomyoma of uterus, unspecified: Secondary | ICD-10-CM

## 2012-09-22 DIAGNOSIS — N949 Unspecified condition associated with female genital organs and menstrual cycle: Secondary | ICD-10-CM

## 2012-09-23 ENCOUNTER — Encounter: Payer: Self-pay | Admitting: Obstetrics & Gynecology

## 2012-09-28 ENCOUNTER — Ambulatory Visit (INDEPENDENT_AMBULATORY_CARE_PROVIDER_SITE_OTHER): Payer: BC Managed Care – PPO | Admitting: Obstetrics & Gynecology

## 2012-09-28 ENCOUNTER — Other Ambulatory Visit: Payer: Self-pay | Admitting: Obstetrics & Gynecology

## 2012-09-28 ENCOUNTER — Encounter: Payer: Self-pay | Admitting: Obstetrics & Gynecology

## 2012-09-28 VITALS — BP 153/100 | HR 68 | Ht 67.5 in | Wt 207.0 lb

## 2012-09-28 DIAGNOSIS — N979 Female infertility, unspecified: Secondary | ICD-10-CM

## 2012-09-28 NOTE — Progress Notes (Signed)
Subjective:     April Dyer is a 42 y.o. female here for a routine exam.  Current complaints: test results. Pt states she had an ultrasound performed 09-21-12. Personal health questionnaire reviewed: yes.   Gynecologic History Patient's last menstrual period was 09/23/2012. Contraception: none Last Pap: 05/2012. Results were: normal Last mammogram: 12/2010. Results were: normal  Obstetric History OB History   Grav Para Term Preterm Abortions TAB SAB Ect Mult Living                   The following portions of the patient's history were reviewed and updated as appropriate: allergies, current medications, past family history, past medical history, past social history, past surgical history and problem list.  Review of Systems Pertinent items are noted in HPI.    Objective:    No exam today   Assessment:    Infertility  Uterine fibroids  Plan:      [facility] HSG Return next week

## 2012-09-28 NOTE — Patient Instructions (Addendum)
Hysterosalpingography  Hysterosalpingography is a procedure using an X-ray and contrast dye to look at the inside of your uterus and fallopian tubes. The contrast dye is injected into the uterus through the vagina and cervix while X-ray pictures are taken. This procedure may help your caregiver determine whether you have uterine tumors, adhesions, or structural abnormalities. It is commonly used to help determine reasons why a woman is unable to have children (infertile).  LET YOUR CAREGIVER KNOW ABOUT:  · Allergies to medicine or food, especially shellfish.  · Medicines taken, including vitamins, herbs, eyedrops, over-the-counter medicines, and creams.  · Use of steroids (by mouth or creams).  · Previous problems with anesthetics or numbing medicines.  · History of bleeding problems or blood clots.  · Previous pelvic surgery.  · Other health problems, including diabetes and kidney problems.  · Possibility of pregnancy.  · Any allergies to iodine or X-ray dye (contrast dye).  · Any recent pelvic infections or sexually transmitted diseases (STDs).  RISKS AND COMPLICATIONS   · Infection in the lining of the uterus (endometritis) or fallopian tubes (salpingitis).  · Damage or puncturing of the uterus or fallopian tubes.  · An allergic reaction to the contrast dye used to perform the X-ray.  BEFORE THE PROCEDURE   · Schedule the procedure after your period stops, but before your next ovulation. This is usually between day 5 and 10 of your last period. Day 1 is the first day of your period.  · Ask your caregiver about changing or stopping your regular medicines.  · You may eat and drink as normal.  · You may be given a medicine to relax you (sedative) or an over-the-counter pain medicine to lessen any discomfort during the procedure.  · Empty your bladder before the procedure begins.  PROCEDURE  · You will lie down on an X-ray table with your feet in stirrups.  · A device called a speculum will be placed into your  vagina. This allows your caregiver to see inside your vagina to the cervix.  · The cervix will be washed with a special soap.  · A thin, flexible tube will be passed through the cervix into the uterus.  · Contrast dye will be put into this tube.  · Several X-rays will be taken as the contrast dye spreads through the uterus and fallopian tubes.  · The tube will be taken out after the procedure.  · The procedure usually lasts about 15 to 30 minutes.  AFTER THE PROCEDURE   · Most of the contrast dye will flow out naturally. You may want to wear a sanitary pad.  · You may have cramping and spotting. This should go away in 24 hours.  · Ask when your test results will be ready. Make sure you get your test results.   Document Released: 04/13/2004 Document Revised: 06/03/2011 Document Reviewed: 01/29/2011  ExitCare® Patient Information ©2014 [employer].

## 2012-09-30 ENCOUNTER — Ambulatory Visit (HOSPITAL_COMMUNITY)
Admission: RE | Admit: 2012-09-30 | Discharge: 2012-09-30 | Disposition: A | Discharge status: Home / Self Care | Payer: BC Managed Care – PPO | Source: Ambulatory Visit | Attending: Obstetrics & Gynecology | Admitting: Obstetrics & Gynecology

## 2012-09-30 DIAGNOSIS — N7013 Chronic salpingitis and oophoritis: Secondary | ICD-10-CM | POA: Insufficient documentation

## 2012-09-30 DIAGNOSIS — N979 Female infertility, unspecified: Secondary | ICD-10-CM | POA: Insufficient documentation

## 2012-09-30 MED ORDER — IOHEXOL 300 MG/ML  SOLN
10.0000 mL | Freq: Once | INTRAMUSCULAR | Status: AC | PRN
  Administered 2012-09-30: 10 mL

## 2012-11-25 ENCOUNTER — Ambulatory Visit: Payer: BC Managed Care – PPO | Admitting: Obstetrics & Gynecology

## 2012-11-26 ENCOUNTER — Ambulatory Visit (INDEPENDENT_AMBULATORY_CARE_PROVIDER_SITE_OTHER): Payer: BC Managed Care – PPO | Admitting: Obstetrics & Gynecology

## 2012-11-26 ENCOUNTER — Encounter: Payer: Self-pay | Admitting: Obstetrics & Gynecology

## 2012-11-26 VITALS — BP 126/89 | HR 69 | Temp 97.8°F | Wt 205.0 lb

## 2012-11-26 DIAGNOSIS — N760 Acute vaginitis: Secondary | ICD-10-CM

## 2012-11-26 MED ORDER — METRONIDAZOLE 500 MG PO TABS: 500.0000 mg | ORAL_TABLET | Freq: Two times a day (BID) | ORAL | Status: DC

## 2012-11-26 NOTE — Progress Notes (Signed)
Subjective:     April Dyer is a 42 y.o. female here for a routine exam.  Current complaints: follow up exam. Pt states she had an HSG. Here to review the results. Personal health questionnaire reviewed: yes.   Gynecologic History Patient's last menstrual period was 11/11/2012. Contraception: none   Obstetric History OB History  No data available     The following portions of the patient's history were reviewed and updated as appropriate: allergies, current medications, past family history, past medical history, past social history, past surgical history and problem list.  Review of Systems Genitourinary:positive for vaginal discharge    Objective:    SPEC: thin white discharge   Assessment:    Bacterial vaginosis  Infertility--pelvic factor; HSG reviewed Plan:   Metronidazole Offered REI referral Return prn

## 2012-12-07 ENCOUNTER — Encounter: Payer: Self-pay | Admitting: Obstetrics & Gynecology

## 2012-12-07 NOTE — Patient Instructions (Signed)
Vaginitis Vaginitis is an inflammation of the vagina. It is most often caused by a change in the normal balance of the bacteria and yeast that live in the vagina. This change in balance causes an overgrowth of certain bacteria or yeast, which causes the inflammation. There are different types of vaginitis, but the most common types are:  Bacterial vaginosis.  Yeast infection (candidiasis).  Trichomoniasis vaginitis. This is a sexually transmitted infection (STI).  Viral vaginitis.  Atropic vaginitis.  Allergic vaginitis. CAUSES  The cause depends on the type of vaginitis. Vaginitis can be caused by:  Bacteria (bacterial vaginosis).  Yeast (yeast infection).  A parasite (trichomoniasis vaginitis)  A virus (viral vaginitis).  Low hormone levels (atrophic vaginitis). Low hormone levels can occur during pregnancy, breastfeeding, or after menopause.  Irritants, such as bubble baths, scented tampons, and feminine sprays (allergic vaginitis). Other factors can change the normal balance of the yeast and bacteria that live in the vagina. These include:  Antibiotic medicines.  Poor hygiene.  Diaphragms, vaginal sponges, spermicides, birth control pills, and intrauterine devices (IUD).  Sexual intercourse.  Infection.  Uncontrolled diabetes.  A weakened immune system. SYMPTOMS  Symptoms can vary depending on the cause of the vaginitis. Common symptoms include:  Abnormal vaginal discharge.  The discharge is white, gray, or yellow with bacterial vaginosis.  The discharge is thick, white, and cheesy with a yeast infection.  The discharge is frothy and yellow or greenish with trichomoniasis.  A bad vaginal odor.  The odor is fishy with bacterial vaginosis.  Vaginal itching, pain, or swelling.  Painful intercourse.  Pain or burning when urinating. Sometimes, there are no symptoms. TREATMENT  Treatment will vary depending on the type of infection.   Bacterial  vaginosis and trichomoniasis are often treated with antibiotic creams or pills.  Yeast infections are often treated with antifungal medicines, such as vaginal creams or suppositories.  Viral vaginitis has no cure, but symptoms can be treated with medicines that relieve discomfort. Your sexual partner should be treated as well.  Atrophic vaginitis may be treated with an estrogen cream, pill, suppository, or vaginal ring. If vaginal dryness occurs, lubricants and moisturizing creams may help. You may be told to avoid scented soaps, sprays, or douches.  Allergic vaginitis treatment involves quitting the use of the product that is causing the problem. Vaginal creams can be used to treat the symptoms. HOME CARE INSTRUCTIONS   Take all medicines as directed by your caregiver.  Keep your genital area clean and dry. Avoid soap and only rinse the area with water.  Avoid douching. It can remove the healthy bacteria in the vagina.  Do not use tampons or have sexual intercourse until your vaginitis has been treated. Use sanitary pads while you have vaginitis.  Wipe from front to back. This avoids the spread of bacteria from the rectum to the vagina.  Let air reach your genital area.  Wear cotton underwear to decrease moisture buildup.  Avoid wearing underwear while you sleep until your vaginitis is gone.  Avoid tight pants and underwear or nylons without a cotton panel.  Take off wet clothing (especially bathing suits) as soon as possible.  Use mild, non-scented products. Avoid using irritants, such as:  Scented feminine sprays.  Fabric softeners.  Scented detergents.  Scented tampons.  Scented soaps or bubble baths.  Practice safe sex and use condoms. Condoms may prevent the spread of trichomoniasis and viral vaginitis. SEEK MEDICAL CARE IF:   You have abdominal pain.  You   have a fever or persistent symptoms for more than 2 3 days.  You have a fever and your symptoms suddenly  get worse. Document Released: 01/06/2007 Document Revised: 12/04/2011 Document Reviewed: 08/22/2011 ExitCare Patient Information 2014 [employer].  

## 2012-12-17 ENCOUNTER — Encounter: Payer: Self-pay | Admitting: Obstetrics & Gynecology

## 2012-12-25 ENCOUNTER — Other Ambulatory Visit: Payer: Self-pay

## 2012-12-25 ENCOUNTER — Encounter (HOSPITAL_COMMUNITY): Payer: Self-pay | Admitting: *Deleted

## 2012-12-25 DIAGNOSIS — Z8719 Personal history of other diseases of the digestive system: Secondary | ICD-10-CM | POA: Insufficient documentation

## 2012-12-25 DIAGNOSIS — Z8673 Personal history of transient ischemic attack (TIA), and cerebral infarction without residual deficits: Secondary | ICD-10-CM | POA: Insufficient documentation

## 2012-12-25 DIAGNOSIS — Z8659 Personal history of other mental and behavioral disorders: Secondary | ICD-10-CM | POA: Insufficient documentation

## 2012-12-25 DIAGNOSIS — Z8742 Personal history of other diseases of the female genital tract: Secondary | ICD-10-CM | POA: Insufficient documentation

## 2012-12-25 DIAGNOSIS — R209 Unspecified disturbances of skin sensation: Secondary | ICD-10-CM | POA: Insufficient documentation

## 2012-12-25 DIAGNOSIS — I10 Essential (primary) hypertension: Secondary | ICD-10-CM | POA: Insufficient documentation

## 2012-12-25 DIAGNOSIS — R079 Chest pain, unspecified: Secondary | ICD-10-CM | POA: Insufficient documentation

## 2012-12-25 DIAGNOSIS — R4789 Other speech disturbances: Secondary | ICD-10-CM | POA: Insufficient documentation

## 2012-12-25 NOTE — ED Notes (Signed)
The pt is c/o mid-chest pain while shopping in [employer] tonight.  Hyperventilating on arrival here. Sob nausea

## 2012-12-26 ENCOUNTER — Emergency Department (HOSPITAL_COMMUNITY)
Admission: EM | Admit: 2012-12-26 | Discharge: 2012-12-26 | Disposition: A | Discharge status: Home / Self Care | Payer: BC Managed Care – PPO | Attending: Emergency Medicine | Admitting: Emergency Medicine

## 2012-12-26 ENCOUNTER — Emergency Department (HOSPITAL_COMMUNITY): Payer: BC Managed Care – PPO

## 2012-12-26 DIAGNOSIS — I10 Essential (primary) hypertension: Secondary | ICD-10-CM

## 2012-12-26 DIAGNOSIS — R4781 Slurred speech: Secondary | ICD-10-CM | POA: Diagnosis present

## 2012-12-26 DIAGNOSIS — R4789 Other speech disturbances: Secondary | ICD-10-CM

## 2012-12-26 DIAGNOSIS — R200 Anesthesia of skin: Secondary | ICD-10-CM

## 2012-12-26 DIAGNOSIS — R079 Chest pain, unspecified: Secondary | ICD-10-CM

## 2012-12-26 LAB — CBC WITH DIFFERENTIAL/PLATELET
Eosinophils Absolute: 0.2 10*3/uL (ref 0.0–0.7)
HCT: 35 % — ABNORMAL LOW (ref 36.0–46.0)
Hemoglobin: 12.2 g/dL (ref 12.0–15.0)
Lymphocytes Relative: 28 % (ref 12–46)
Lymphs Abs: 3.6 10*3/uL (ref 0.7–4.0)
MCHC: 34.9 g/dL (ref 30.0–36.0)
Monocytes Absolute: 1.1 10*3/uL — ABNORMAL HIGH (ref 0.1–1.0)
Monocytes Relative: 8 % (ref 3–12)
Neutro Abs: 7.8 10*3/uL — ABNORMAL HIGH (ref 1.7–7.7)
Platelets: 216 10*3/uL (ref 150–400)
RBC: 3.69 MIL/uL — ABNORMAL LOW (ref 3.87–5.11)
WBC: 12.8 10*3/uL — ABNORMAL HIGH (ref 4.0–10.5)

## 2012-12-26 LAB — COMPREHENSIVE METABOLIC PANEL
ALT: 18 U/L (ref 0–35)
Albumin: 3.2 g/dL — ABNORMAL LOW (ref 3.5–5.2)
Alkaline Phosphatase: 54 U/L (ref 39–117)
BUN: 9 mg/dL (ref 6–23)
CO2: 22 mEq/L (ref 19–32)
Chloride: 105 mEq/L (ref 96–112)
GFR calc Af Amer: 85 mL/min — ABNORMAL LOW (ref >90)
GFR calc non Af Amer: 73 mL/min — ABNORMAL LOW (ref >90)
Glucose, Bld: 93 mg/dL (ref 70–99)
Potassium: 3.4 mEq/L — ABNORMAL LOW (ref 3.5–5.1)
Total Bilirubin: 0.1 mg/dL — ABNORMAL LOW (ref 0.3–1.2)

## 2012-12-26 LAB — TROPONIN I: Troponin I: <0.3 ng/mL (ref <0.30)

## 2012-12-26 MED ORDER — ASPIRIN 81 MG PO CHEW
324.0000 mg | CHEWABLE_TABLET | Freq: Once | ORAL | Status: AC
  Administered 2012-12-26: 324 mg via ORAL
  Filled 2012-12-26: qty 4

## 2012-12-26 MED ORDER — KETOROLAC TROMETHAMINE 30 MG/ML IJ SOLN
30.0000 mg | Freq: Once | INTRAMUSCULAR | Status: AC
  Administered 2012-12-26: 30 mg via INTRAVENOUS
  Filled 2012-12-26: qty 1

## 2012-12-26 MED ORDER — MORPHINE SULFATE 4 MG/ML IJ SOLN
6.0000 mg | Freq: Once | INTRAMUSCULAR | Status: AC
  Administered 2012-12-26: 6 mg via INTRAVENOUS
  Filled 2012-12-26: qty 2

## 2012-12-26 MED ORDER — LORAZEPAM 2 MG/ML IJ SOLN
1.0000 mg | Freq: Once | INTRAMUSCULAR | Status: AC
  Administered 2012-12-26: 1 mg via INTRAVENOUS
  Filled 2012-12-26: qty 1

## 2012-12-26 NOTE — ED Notes (Signed)
Pt. Alert and oriented x4. Speech is clear. Able to follow commands and answer questions appropriately. Calm and stable. Denies pain.

## 2012-12-26 NOTE — ED Notes (Signed)
Pt's family began yelling from room, two family members one being the spouse came to the nurses station yelling, "my wife needs some help." upon arrival to room pt was crying and hyperventilating, pt repeated over and over "lets go home." Another family member was yelling when staff arrived into the room, he stepped out of room and was pacing the hall way, breathing loudly through his nose, and rubbing his hands together. [employer] spoke with family. EDP Debby Bud, Jody RN, Janelle RN, and Moshe Salisbury at bedside.

## 2012-12-26 NOTE — ED Notes (Signed)
Pt. Alert and oriented x4. Able to answer questions. Talking calmly. States that she is feeling better.

## 2012-12-26 NOTE — ED Notes (Signed)
Pt. Now answering questions and responding to verbal stimulus and following commands.

## 2012-12-26 NOTE — ED Notes (Addendum)
Family came to nurses station requesting assistance, pt's spouse stated "my wife is not responding." Upon arrival to room pt was breathing through her mouth, looking at this RN, able to follow commands, grips were weak but equal bilaterally, VSS, non-verbal. EDP Zavetz came into room gave a verbal order to transport pt to CT and place call to Neuro after thorough exam. Arther Dames, Karolee Stamps RN, Jody RN, and [employer]

## 2012-12-26 NOTE — ED Notes (Signed)
Neurologist at bedside. 

## 2012-12-26 NOTE — ED Notes (Signed)
Pt. Lip smacking, not answering questions or opening eyes. Does follow some commands.

## 2012-12-26 NOTE — Consult Note (Signed)
Reason for Consult: New-onset speech abnormality.  HPI:                                                                                                                                          April Dyer is an 42 y.o. female with a history of hypertension and unclear history of possible stroke 2 years ago, who came to the emergency room complaining of chest pain with chest wall tenderness. During emergency room visit she indicated she had difficulty with speech and variable speech output abnormalities including mumbling, as well as varying dysarthria. He said no swallowing difficulty. No facial weakness was noted. No extremity weakness was noted. She had no problems understanding what was being said to her. CT scan of her head was obtained which showed no acute intracranial abnormality. There was also no evidence of previous stroke. Patient has not been on antiplatelet therapy consistently.  Past Medical History  Diagnosis Date  . Hypertension     2011  . Depression     1996-currently untreated as did not like monotone emotions on treatment.   Marland Kitchen GERD (gastroesophageal reflux disease)     since 2007treated with Zegrid 60mg  (omeprazole and sodium bicarb -atypical regimen  . Migraines     2005  . Fallopian tube disorder     diagnostic laparascopy 1993 shoed left sidecd blocked tube. HSG in 2005 showed bilateral blockage. 2007 test HSG inconclusive.   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     History reviewed. No pertinent past surgical history.  Family History  Problem Relation Age of Onset  . Diabetes type II      mom, sister, grandparents, aunts/uncles  . Hypertension      mom, brother, grandparents  . Hyperlipidemia      aunts/uncles  . Stroke      grandparents  . Breast cancer      aunt in 81s    Social History:  reports that she has never smoked. She does not have any smokeless tobacco history on file. She  reports that she does not drink alcohol or use illicit drugs.  No Known Allergies  MEDICATIONS:  I have reviewed the patient's current medications.   ROS:                                                                                                                                       History obtained from the patient  General ROS: negative for - chills, fatigue, fever, night sweats, weight gain or weight loss Psychological ROS: negative for - behavioral disorder, hallucinations, memory difficulties, mood swings or suicidal ideation Ophthalmic ROS: negative for - blurry vision, double vision, eye pain or loss of vision ENT ROS: negative for - epistaxis, nasal discharge, oral lesions, sore throat, tinnitus or vertigo Allergy and Immunology ROS: negative for - hives or itchy/watery eyes Hematological and Lymphatic ROS: negative for - bleeding problems, bruising or swollen lymph nodes Endocrine ROS: negative for - galactorrhea, hair pattern changes, polydipsia/polyuria or temperature intolerance Respiratory ROS: negative for - cough, hemoptysis, shortness of breath or wheezing Cardiovascular ROS: Taken to the ER complaining of chest pain Gastrointestinal ROS: negative for - abdominal pain, diarrhea, hematemesis, nausea/vomiting or stool incontinence Genito-Urinary ROS: negative for - dysuria, hematuria, incontinence or urinary frequency/urgency Musculoskeletal ROS: Chest wall tenderness Neurological ROS: as noted in HPI Dermatological ROS: negative for rash and skin lesion changes   Blood pressure 137/78, pulse 65, temperature 97.8 F (36.6 C), temperature source Oral, resp. rate 20, height 5\' 9"  (1.753 m), weight 94.348 kg (208 lb), last menstrual period 11/25/2012, SpO2 99.00%.   Neurologic Examination:                                                                                                       Mental Status: Alert, oriented, thought content appropriate.  Degree of dysarthria or variable and nonphysiologic. Able to follow commands without difficulty. Cranial Nerves: II-Visual fields were normal. III/IV/VI-Pupils were equal and reacted. Extraocular movements were full and conjugate.    V/VII-no facial numbness and no facial weakness. VIII-normal. X-nonphysiologic dysarthria of varying severity; poor effort with phonation with very low volume speech. XII-midline tongue extension Motor: 5/5 bilaterally with normal tone and bulk Sensory: Normal throughout. Deep Tendon Reflexes: 2+ and symmetric. Plantars: Flexor bilaterally Cerebellar: Normal finger-to-nose testing. Carotid auscultation: Normal  No results found for this basename: cbc, bmp, coags, chol, tri, ldl, hga1c    Results for orders placed during the hospital encounter of 12/26/12 (from the past 48 hour(s))  CBC WITH DIFFERENTIAL     Status: Abnormal   Collection Time    12/25/12 11:51 PM  Result Value Range   WBC 12.8 (*) 4.0 - 10.5 K/uL   RBC 3.69 (*) 3.87 - 5.11 MIL/uL   Hemoglobin 12.2  12.0 - 15.0 g/dL   HCT 46.9 (*) 62.9 - 52.8 %   MCV 94.9  78.0 - 100.0 fL   MCH 33.1  26.0 - 34.0 pg   MCHC 34.9  30.0 - 36.0 g/dL   RDW 41.3  24.4 - 01.0 %   Platelets 216  150 - 400 K/uL   Neutrophils Relative % 61  43 - 77 %   Neutro Abs 7.8 (*) 1.7 - 7.7 K/uL   Lymphocytes Relative 28  12 - 46 %   Lymphs Abs 3.6  0.7 - 4.0 K/uL   Monocytes Relative 8  3 - 12 %   Monocytes Absolute 1.1 (*) 0.1 - 1.0 K/uL   Eosinophils Relative 2  0 - 5 %   Eosinophils Absolute 0.2  0.0 - 0.7 K/uL   Basophils Relative 0  0 - 1 %   Basophils Absolute 0.1  0.0 - 0.1 K/uL  TROPONIN I     Status: None   Collection Time    12/25/12 11:51 PM      Result Value Range   Troponin I <0.30  <0.30 ng/mL   Comment:            Due to the release kinetics of cTnI,     a negative result within the first hours      of the onset of symptoms does not rule out     myocardial infarction with certainty.     If myocardial infarction is still suspected,     repeat the test at appropriate intervals.  COMPREHENSIVE METABOLIC PANEL     Status: Abnormal   Collection Time    12/25/12 11:51 PM      Result Value Range   Sodium 139  135 - 145 mEq/L   Potassium 3.4 (*) 3.5 - 5.1 mEq/L   Chloride 105  96 - 112 mEq/L   CO2 22  19 - 32 mEq/L   Glucose, Bld 93  70 - 99 mg/dL   BUN 9  6 - 23 mg/dL   Creatinine, Ser 2.72  0.50 - 1.10 mg/dL   Calcium 8.6  8.4 - 53.6 mg/dL   Total Protein 5.7 (*) 6.0 - 8.3 g/dL   Albumin 3.2 (*) 3.5 - 5.2 g/dL   AST 19  0 - 37 U/L   ALT 18  0 - 35 U/L   Alkaline Phosphatase 54  39 - 117 U/L   Total Bilirubin 0.1 (*) 0.3 - 1.2 mg/dL   GFR calc non Af Amer 73 (*) >90 mL/min   GFR calc Af Amer 85 (*) >90 mL/min   Comment: (NOTE)     The eGFR has been calculated using the CKD EPI equation.     This calculation has not been validated in all clinical situations.     eGFR's persistently <90 mL/min signify possible Chronic Kidney     Disease.    Ct Head Wo Contrast  12/26/2012   CLINICAL DATA:  Left facial numbness. History of stroke  EXAM: CT HEAD WITHOUT CONTRAST  TECHNIQUE: Contiguous axial images were obtained from the base of the skull through the vertex without intravenous contrast.  COMPARISON:  04/23/2007  FINDINGS: Mild to moderate motion limitation.  Skull:No acute osseous abnormality. No lytic or blastic lesion.  Orbits: No acute abnormality.  Brain: No evidence of acute  abnormality, such as acute infarction, hemorrhage, hydrocephalus, or mass lesion/mass effect.  IMPRESSION: No evidence of acute intracranial disease.   Electronically Signed   By: Tiburcio Pea M.D.   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 Patient has no objective signs of acute neurological deficit. There's no  documentation of previous stroke, although family reports she was admitted to this institution for several days. X.  Recommendations: 1. Antiplatelet therapy with aspirin 81 mg per day 2. No further neurodiagnostic studies are indicated, as well as no further neurological intervention 3. Consider psychiatric evaluation if patient's apparent speech deficit persists  C.R. Roseanne Reno, MD [facility] [phone]  12/26/2012, 1:57 AM

## 2012-12-26 NOTE — ED Notes (Signed)
Pt's family members reported right facial drooping.  No facial drooping noted to pt.  Pt not answering questions only groaning.  MD made aware.

## 2012-12-26 NOTE — ED Provider Notes (Signed)
CSN: [id number]     Arrival date & time 12/25/12  2341 History   First MD Initiated Contact with Patient 12/26/12 0015     Chief Complaint  Patient presents with  . Chest Pain   (Consider location/radiation/quality/duration/timing/severity/associated sxs/prior Treatment) HPI Comments: 42 yo female with CVA, htn hx presents with left upper chest pain, sharp since 3 hrs PTA.  Pt was lifting earlier in the day. Worse with movement and palpation. Patient denies blood clot history, active cancer, recent major trauma or surgery, unilateral leg swelling/ pain, recent long travel, hemoptysis.  Pt developing intermittent speech difficulty during exam however hyperventilating and in pain.  No significant ha, hx of migraines. Mild frontal ha similar to previous, gradual onset.  No exertional sxs. No cardiac hx.  Patient is a 42 y.o. female presenting with chest pain. The history is provided by the patient and a relative.  Chest Pain Associated symptoms: no abdominal pain, no back pain, no fever, no shortness of breath, not vomiting and no weakness     Past Medical History  Diagnosis Date  . Hypertension     2011  . Depression     1996-currently untreated as did not like monotone emotions on treatment.   Marland Kitchen GERD (gastroesophageal reflux disease)     since 2007treated with Zegrid 60mg  (omeprazole and sodium bicarb -atypical regimen  . Migraines     2005  . Fallopian tube disorder     diagnostic laparascopy 1993 shoed left sidecd blocked tube. HSG in 2005 showed bilateral blockage. 2007 test HSG inconclusive.   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    History reviewed. No pertinent past surgical history. Family History  Problem Relation Age of Onset  . Diabetes type II      mom, sister, grandparents, aunts/uncles  . Hypertension      mom, brother, grandparents  . Hyperlipidemia      aunts/uncles  . Stroke      grandparents  .  Breast cancer      aunt in 27s   History  Substance Use Topics  . Smoking status: Never Smoker   . Smokeless tobacco: Not on file  . Alcohol Use: No   OB History   Grav Para Term Preterm Abortions TAB SAB Ect Mult Living                 Review of Systems  Constitutional: Negative for fever and chills.  HENT: Negative for neck pain and neck stiffness.   Eyes: Negative for visual disturbance.  Respiratory: Negative for shortness of breath.   Cardiovascular: Positive for chest pain. Negative for leg swelling.  Gastrointestinal: Negative for vomiting and abdominal pain.  Genitourinary: Negative for dysuria and flank pain.  Musculoskeletal: Negative for back pain.  Skin: Negative for rash.  Neurological: Positive for speech difficulty. Negative for syncope, weakness and light-headedness.    Allergies  Review of patient's allergies indicates no known allergies.  Home Medications   Current Outpatient Rx  Name  Route  Sig  Dispense  Refill  . ipratropium (ATROVENT) 0.06 % nasal spray               . losartan-hydrochlorothiazide (HYZAAR) 50-12.5 MG per tablet   Oral   Take 1 tablet by mouth daily.         . metoprolol succinate (TOPROL-XL) 50 MG 24 hr tablet   Oral   Take 1 tablet by mouth  daily.          BP 137/78  Pulse 65  Temp(Src) 97.8 F (36.6 C) (Oral)  Resp 20  Ht 5\' 9"  (1.753 m)  Wt 208 lb (94.348 kg)  BMI 30.7 kg/m2  SpO2 99%  LMP 11/25/2012 Physical Exam  Nursing note and vitals reviewed. Constitutional: She is oriented to person, place, and time. She appears well-developed and well-nourished.  HENT:  Head: Normocephalic and atraumatic.  Eyes: Conjunctivae are normal. Right eye exhibits no discharge. Left eye exhibits no discharge.  Neck: Normal range of motion. Neck supple. No tracheal deviation present.  Cardiovascular: Normal rate and regular rhythm.   Pulmonary/Chest: Effort normal and breath sounds normal.  Abdominal: Soft. She exhibits  no distension. There is no tenderness. There is no guarding.  Musculoskeletal: She exhibits tenderness (left upper chest to palpation and with sitting up). She exhibits no edema.  Neurological: She is alert and oriented to person, place, and time. No cranial nerve deficit.  After calming patient down and slowing her breathing pt had normal neuro exam CNs, strength, sensation, vision.   Pt intermittently would get anxious and have thickened tongue speech, no focal deficits however difficult exam as pt not always cooperating.   Skin: Skin is warm. No rash noted.  Psychiatric: She has a normal mood and affect.    ED Course  Procedures (including critical care time) Labs Review Labs Reviewed  CBC WITH DIFFERENTIAL - Abnormal; Notable for the following:    WBC 12.8 (*)    RBC 3.69 (*)    HCT 35.0 (*)    Neutro Abs 7.8 (*)    Monocytes Absolute 1.1 (*)    All other components within normal limits  COMPREHENSIVE METABOLIC PANEL - Abnormal; Notable for the following:    Potassium 3.4 (*)    Total Protein 5.7 (*)    Albumin 3.2 (*)    Total Bilirubin 0.1 (*)    GFR calc non Af Amer 73 (*)    GFR calc Af Amer 85 (*)    All other components within normal limits  TROPONIN I   Imaging Review No results found.  MDM  No diagnosis found. Chest pain atypical, consistent with MSk. Pt having stress like reaction in ED.  Normal neuro exam then difficult neuro exam.  With stroke hx CT head done, consulted neuro stat to help in evaluation and if they thought code stroke Since developed 1 hr into ED stay.   Multiple rechecks. Pain meds given .  Date: 12/26/2012  Rate: 68  Rhythm: normal sinus rhythm  QRS Axis: normal  Intervals: normal  ST/T Wave abnormalities: nonspecific ST changes  Conduction Disutrbances:none  Narrative Interpretation:   Old EKG Reviewed: changes noted flattening T waves, no acute ST elevations  Pt having anxiety/ stress in ED, argument with family  member. Observed, delta troponin neg.  Pt comfortable on recheck, sxs resolved.  Atypical cp.  Discussed close fup and possible outpt stress. Family okay with plan. Neuro agreed stress response, no acute neuro treatment required.  DC      Enid Skeens, MD 12/26/12 [phone]

## 2013-03-09 ENCOUNTER — Encounter: Payer: Self-pay | Admitting: Obstetrics & Gynecology

## 2013-05-27 ENCOUNTER — Ambulatory Visit: Payer: BC Managed Care – PPO | Admitting: Obstetrics & Gynecology

## 2013-08-26 ENCOUNTER — Telehealth: Payer: Self-pay | Admitting: *Deleted

## 2013-08-26 NOTE — Telephone Encounter (Signed)
Call from [employer] wanting patient's referral history. History of office visit dates and referral info for primary care physician given.

## 2013-11-07 ENCOUNTER — Emergency Department (HOSPITAL_COMMUNITY)
Admission: EM | Admit: 2013-11-07 | Discharge: 2013-11-07 | Disposition: A | Discharge status: Home / Self Care | Payer: BC Managed Care – PPO | Attending: Emergency Medicine | Admitting: Emergency Medicine

## 2013-11-07 ENCOUNTER — Encounter (HOSPITAL_COMMUNITY): Payer: Self-pay | Admitting: Emergency Medicine

## 2013-11-07 ENCOUNTER — Emergency Department (HOSPITAL_COMMUNITY): Payer: BC Managed Care – PPO

## 2013-11-07 DIAGNOSIS — I10 Essential (primary) hypertension: Secondary | ICD-10-CM | POA: Diagnosis not present

## 2013-11-07 DIAGNOSIS — Z8669 Personal history of other diseases of the nervous system and sense organs: Secondary | ICD-10-CM | POA: Insufficient documentation

## 2013-11-07 DIAGNOSIS — Z3202 Encounter for pregnancy test, result negative: Secondary | ICD-10-CM | POA: Insufficient documentation

## 2013-11-07 DIAGNOSIS — Z8719 Personal history of other diseases of the digestive system: Secondary | ICD-10-CM | POA: Insufficient documentation

## 2013-11-07 DIAGNOSIS — N949 Unspecified condition associated with female genital organs and menstrual cycle: Secondary | ICD-10-CM | POA: Diagnosis not present

## 2013-11-07 DIAGNOSIS — Z8659 Personal history of other mental and behavioral disorders: Secondary | ICD-10-CM | POA: Diagnosis not present

## 2013-11-07 DIAGNOSIS — R102 Pelvic and perineal pain: Secondary | ICD-10-CM

## 2013-11-07 DIAGNOSIS — Z79899 Other long term (current) drug therapy: Secondary | ICD-10-CM | POA: Diagnosis not present

## 2013-11-07 DIAGNOSIS — Z9089 Acquired absence of other organs: Secondary | ICD-10-CM | POA: Insufficient documentation

## 2013-11-07 LAB — CBC WITH DIFFERENTIAL/PLATELET
Basophils Absolute: 0 10*3/uL (ref 0.0–0.1)
Basophils Relative: 0 % (ref 0–1)
EOS PCT: 1 % (ref 0–5)
Eosinophils Absolute: 0.1 10*3/uL (ref 0.0–0.7)
HEMATOCRIT: 38.3 % (ref 36.0–46.0)
HEMOGLOBIN: 13 g/dL (ref 12.0–15.0)
LYMPHS ABS: 2.2 10*3/uL (ref 0.7–4.0)
LYMPHS PCT: 20 % (ref 12–46)
MCH: 33.6 pg (ref 26.0–34.0)
MCHC: 33.9 g/dL (ref 30.0–36.0)
MCV: 99 fL (ref 78.0–100.0)
MONOS PCT: 5 % (ref 3–12)
Monocytes Absolute: 0.6 10*3/uL (ref 0.1–1.0)
Neutro Abs: 8.2 10*3/uL — ABNORMAL HIGH (ref 1.7–7.7)
Neutrophils Relative %: 74 % (ref 43–77)
Platelets: 251 10*3/uL (ref 150–400)
RBC: 3.87 MIL/uL (ref 3.87–5.11)
RDW: 13.3 % (ref 11.5–15.5)
WBC: 11.1 10*3/uL — AB (ref 4.0–10.5)

## 2013-11-07 LAB — COMPREHENSIVE METABOLIC PANEL
ALT: 16 U/L (ref 0–35)
AST: 23 U/L (ref 0–37)
Albumin: 3.1 g/dL — ABNORMAL LOW (ref 3.5–5.2)
Alkaline Phosphatase: 74 U/L (ref 39–117)
Anion gap: 11 (ref 5–15)
BUN: 8 mg/dL (ref 6–23)
CALCIUM: 9.2 mg/dL (ref 8.4–10.5)
CO2: 25 meq/L (ref 19–32)
Chloride: 108 mEq/L (ref 96–112)
Creatinine, Ser: 1 mg/dL (ref 0.50–1.10)
GFR calc Af Amer: 78 mL/min — ABNORMAL LOW (ref >90)
GFR, EST NON AFRICAN AMERICAN: 67 mL/min — AB (ref >90)
GLUCOSE: 129 mg/dL — AB (ref 70–99)
Potassium: 3.7 mEq/L (ref 3.7–5.3)
SODIUM: 144 meq/L (ref 137–147)
Total Bilirubin: <0.2 mg/dL — ABNORMAL LOW (ref 0.3–1.2)
Total Protein: 5.6 g/dL — ABNORMAL LOW (ref 6.0–8.3)

## 2013-11-07 LAB — URINALYSIS, ROUTINE W REFLEX MICROSCOPIC
Bilirubin Urine: NEGATIVE
Glucose, UA: NEGATIVE mg/dL
Hgb urine dipstick: NEGATIVE
Ketones, ur: NEGATIVE mg/dL
Leukocytes, UA: NEGATIVE
NITRITE: NEGATIVE
Protein, ur: NEGATIVE mg/dL
Specific Gravity, Urine: 1.012 (ref 1.005–1.030)
Urobilinogen, UA: 0.2 mg/dL (ref 0.0–1.0)
pH: 7 (ref 5.0–8.0)

## 2013-11-07 LAB — WET PREP, GENITAL
Clue Cells Wet Prep HPF POC: NONE SEEN
Trich, Wet Prep: NONE SEEN
Yeast Wet Prep HPF POC: NONE SEEN

## 2013-11-07 LAB — HIV ANTIBODY (ROUTINE TESTING W REFLEX): HIV 1&2 Ab, 4th Generation: NONREACTIVE

## 2013-11-07 LAB — POC URINE PREG, ED: PREG TEST UR: NEGATIVE

## 2013-11-07 MED ORDER — SODIUM CHLORIDE 0.9 % IV SOLN
INTRAVENOUS | Status: DC
  Administered 2013-11-07: 16:00:00 via INTRAVENOUS

## 2013-11-07 MED ORDER — MORPHINE SULFATE 4 MG/ML IJ SOLN
4.0000 mg | Freq: Once | INTRAMUSCULAR | Status: AC
  Administered 2013-11-07: 4 mg via INTRAVENOUS
  Filled 2013-11-07: qty 1

## 2013-11-07 MED ORDER — DOXYCYCLINE HYCLATE 100 MG PO CAPS: 100.0000 mg | ORAL_CAPSULE | Freq: Two times a day (BID) | ORAL | Status: DC

## 2013-11-07 MED ORDER — MORPHINE SULFATE 4 MG/ML IJ SOLN
8.0000 mg | Freq: Once | INTRAMUSCULAR | Status: DC
  Filled 2013-11-07: qty 2

## 2013-11-07 MED ORDER — LIDOCAINE HCL (PF) 1 % IJ SOLN
5.0000 mL | Freq: Once | INTRAMUSCULAR | Status: AC
  Administered 2013-11-07: 2.1 mL
  Filled 2013-11-07: qty 5

## 2013-11-07 MED ORDER — OXYCODONE-ACETAMINOPHEN 7.5-325 MG PO TABS: 1.0000 | ORAL_TABLET | ORAL | Status: DC | PRN

## 2013-11-07 MED ORDER — ONDANSETRON 4 MG PO TBDP: 8.0000 mg | ORAL_TABLET | Freq: Once | ORAL | Status: DC

## 2013-11-07 MED ORDER — ONDANSETRON HCL 4 MG/2ML IJ SOLN
4.0000 mg | Freq: Once | INTRAMUSCULAR | Status: AC
  Administered 2013-11-07: 4 mg via INTRAVENOUS
  Filled 2013-11-07: qty 2

## 2013-11-07 MED ORDER — CEFTRIAXONE SODIUM 250 MG IJ SOLR
250.0000 mg | Freq: Once | INTRAMUSCULAR | Status: AC
  Administered 2013-11-07: 250 mg via INTRAMUSCULAR
  Filled 2013-11-07: qty 250

## 2013-11-07 MED ORDER — IOHEXOL 300 MG/ML  SOLN
100.0000 mL | Freq: Once | INTRAMUSCULAR | Status: AC | PRN
  Administered 2013-11-07: 100 mL via INTRAVENOUS

## 2013-11-07 NOTE — ED Notes (Signed)
Pt c/o abdominal pain and vaginal pressure x 2-3 weeks. Pt presents with bloated abdomen. Pt denies vaginal discharge. Pt period is off, took home pregnancy test that were negative.

## 2013-11-07 NOTE — ED Provider Notes (Addendum)
CSN: [id number]     Arrival date & time 11/07/13  1428 History   First MD Initiated Contact with Patient 11/07/13 1506     Chief Complaint  Patient presents with  . Vaginal Pain  . Abdominal Pain     (Consider location/radiation/quality/duration/timing/severity/associated sxs/prior Treatment) HPI Comments: Patient here complaining of abdominal discomfort or vaginal pressure x2-3 weeks. Notes abdominal bloating as well. No vaginal bleeding or discharge. Took a home pregnancy test that was negative. She is currently receiving infertility treatments. No fever or chills. No vomiting or diarrhea. No hematuria or dysuria. Symptoms are worse when she passes gas. No treatment used prior to arrival. Nothing makes her symptoms better.  Patient is a 43 y.o. female presenting with vaginal pain and abdominal pain. The history is provided by the patient and the spouse.  Vaginal Pain Associated symptoms include abdominal pain.  Abdominal Pain   Past Medical History  Diagnosis Date  . Hypertension     2011  . Depression     1996-currently untreated as did not like monotone emotions on treatment.   Marland Kitchen GERD (gastroesophageal reflux disease)     since 2007treated with Zegrid 60mg  (omeprazole and sodium bicarb -atypical regimen  . Migraines     2005  . Fallopian tube disorder     diagnostic laparascopy 1993 shoed left sidecd blocked tube. HSG in 2005 showed bilateral blockage. 2007 test HSG inconclusive.   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    Past Surgical History  Procedure Laterality Date  . Myomectomy    . Salpingectomy     Family History  Problem Relation Age of Onset  . Diabetes type II      mom, sister, grandparents, aunts/uncles  . Hypertension      mom, brother, grandparents  . Hyperlipidemia      aunts/uncles  . Stroke      grandparents  . Breast cancer      aunt in 40s   History  Substance Use Topics  .  Smoking status: Never Smoker   . Smokeless tobacco: Not on file  . Alcohol Use: No   OB History   Grav Para Term Preterm Abortions TAB SAB Ect Mult Living                 Review of Systems  Gastrointestinal: Positive for abdominal pain.  Genitourinary: Positive for vaginal pain.  All other systems reviewed and are negative.     Allergies  Review of patient's allergies indicates no known allergies.  Home Medications   Prior to Admission medications   Medication Sig Start Date End Date Taking? Authorizing Provider  levothyroxine (SYNTHROID, LEVOTHROID) 50 MCG tablet Take 50 mcg by mouth daily before breakfast.   Yes Historical Provider, MD  metoprolol succinate (TOPROL-XL) 50 MG 24 hr tablet Take 50 mg by mouth daily.  08/03/12  Yes Historical Provider, MD   BP 154/101  Pulse 93  Temp(Src) 98.6 F (37 C) (Oral)  Resp 18  Ht 5\' 8"  (1.727 m)  Wt 207 lb (93.895 kg)  BMI 31.48 kg/m2  SpO2 97%  LMP 09/25/2013 Physical Exam  Nursing note and vitals reviewed. Constitutional: She is oriented to person, place, and time. She appears well-developed and well-nourished.  Non-toxic appearance. No distress.  HENT:  Head: Normocephalic and atraumatic.  Eyes: Conjunctivae, EOM and lids are normal. Pupils are equal, round, and reactive to light.  Neck: Normal range of  motion. Neck supple. No tracheal deviation present. No mass present.  Cardiovascular: Normal rate, regular rhythm and normal heart sounds.  Exam reveals no gallop.   No murmur heard. Pulmonary/Chest: Effort normal and breath sounds normal. No stridor. No respiratory distress. She has no decreased breath sounds. She has no wheezes. She has no rhonchi. She has no rales.  Abdominal: Soft. Normal appearance and bowel sounds are normal. She exhibits no distension. There is tenderness in the right lower quadrant, suprapubic area and left lower quadrant. There is no rigidity, no rebound, no guarding and no CVA tenderness.     Genitourinary: There is no rash on the right labia. There is no rash on the left labia. No vaginal discharge found.  Musculoskeletal: Normal range of motion. She exhibits no edema and no tenderness.  Neurological: She is alert and oriented to person, place, and time. She has normal strength. No cranial nerve deficit or sensory deficit. GCS eye subscore is 4. GCS verbal subscore is 5. GCS motor subscore is 6.  Skin: Skin is warm and dry. No abrasion and no rash noted.  Psychiatric: She has a normal mood and affect. Her speech is normal and behavior is normal.    ED Course  Procedures (including critical care time) Labs Review Labs Reviewed  WET PREP, GENITAL  GC/CHLAMYDIA PROBE AMP  CBC WITH DIFFERENTIAL  COMPREHENSIVE METABOLIC PANEL  HIV ANTIBODY (ROUTINE TESTING)  POC URINE PREG, ED    Imaging Review No results found.   EKG Interpretation None      MDM   Final diagnoses:  None    Patient with increased white blood cells on her wet prep. Urinalysis negative. abd ct neg. Patient will be empirically treated for PID.     Leota Jacobsen, MD 11/07/13 1949  Leota Jacobsen, MD 11/07/13 2018

## 2013-11-07 NOTE — Discharge Instructions (Signed)
Abdominal Pain, Women °Abdominal (stomach, pelvic, or belly) pain can be caused by many things. It is important to tell your doctor: °· The location of the pain. °· Does it come and go or is it present all the time? °· Are there things that start the pain (eating certain foods, exercise)? °· Are there other symptoms associated with the pain (fever, nausea, vomiting, diarrhea)? °All of this is helpful to know when trying to find the cause of the pain. °CAUSES  °· Stomach: virus or bacteria infection, or ulcer. °· Intestine: appendicitis (inflamed appendix), regional ileitis (Crohn's disease), ulcerative colitis (inflamed colon), irritable bowel syndrome, diverticulitis (inflamed diverticulum of the colon), or cancer of the stomach or intestine. °· Gallbladder disease or stones in the gallbladder. °· Kidney disease, kidney stones, or infection. °· Pancreas infection or cancer. °· Fibromyalgia (pain disorder). °· Diseases of the female organs: °¨ Uterus: fibroid (non-cancerous) tumors or infection. °¨ Fallopian tubes: infection or tubal pregnancy. °¨ Ovary: cysts or tumors. °¨ Pelvic adhesions (scar tissue). °¨ Endometriosis (uterus lining tissue growing in the pelvis and on the pelvic organs). °¨ Pelvic congestion syndrome (female organs filling up with blood just before the menstrual period). °¨ Pain with the menstrual period. °¨ Pain with ovulation (producing an egg). °¨ Pain with an IUD (intrauterine device, birth control) in the uterus. °¨ Cancer of the female organs. °· Functional pain (pain not caused by a disease, may improve without treatment). °· Psychological pain. °· Depression. °DIAGNOSIS  °Your doctor will decide the seriousness of your pain by doing an examination. °· Blood tests. °· X-rays. °· Ultrasound. °· CT scan (computed tomography, special type of X-ray). °· MRI (magnetic resonance imaging). °· Cultures, for infection. °· Barium enema (dye inserted in the large intestine, to better view it with  X-rays). °· Colonoscopy (looking in intestine with a lighted tube). °· Laparoscopy (minor surgery, looking in abdomen with a lighted tube). °· Major abdominal exploratory surgery (looking in abdomen with a large incision). °TREATMENT  °The treatment will depend on the cause of the pain.  °· Many cases can be observed and treated at home. °· Over-the-counter medicines recommended by your caregiver. °· Prescription medicine. °· Antibiotics, for infection. °· Birth control pills, for painful periods or for ovulation pain. °· Hormone treatment, for endometriosis. °· Nerve blocking injections. °· Physical therapy. °· Antidepressants. °· Counseling with a psychologist or psychiatrist. °· Minor or major surgery. °HOME CARE INSTRUCTIONS  °· Do not take laxatives, unless directed by your caregiver. °· Take over-the-counter pain medicine only if ordered by your caregiver. Do not take aspirin because it can cause an upset stomach or bleeding. °· Try a clear liquid diet (broth or water) as ordered by your caregiver. Slowly move to a bland diet, as tolerated, if the pain is related to the stomach or intestine. °· Have a thermometer and take your temperature several times a day, and record it. °· Bed rest and sleep, if it helps the pain. °· Avoid sexual intercourse, if it causes pain. °· Avoid stressful situations. °· Keep your follow-up appointments and tests, as your caregiver orders. °· If the pain does not go away with medicine or surgery, you may try: °¨ Acupuncture. °¨ Relaxation exercises (yoga, meditation). °¨ Group therapy. °¨ Counseling. °SEEK MEDICAL CARE IF:  °· You notice certain foods cause stomach pain. °· Your home care treatment is not helping your pain. °· You need stronger pain medicine. °· You want your IUD removed. °· You feel faint or   lightheaded.  You develop nausea and vomiting.  You develop a rash.  You are having side effects or an allergy to your medicine. SEEK IMMEDIATE MEDICAL CARE IF:   Your  pain does not go away or gets worse.  You have a fever.  Your pain is felt only in portions of the abdomen. The right side could possibly be appendicitis. The left lower portion of the abdomen could be colitis or diverticulitis.  You are passing blood in your stools (bright red or black tarry stools, with or without vomiting).  You have blood in your urine.  You develop chills, with or without a fever.  You pass out. MAKE SURE YOU:   Understand these instructions.  Will watch your condition.  Will get help right away if you are not doing well or get worse. Document Released: 01/06/2007 Document Revised: 07/26/2013 Document Reviewed: 01/26/2009 [facility] Patient Information 2015 [place], [place]. This information is not intended to replace advice given to you by your health care provider. Make sure you discuss any questions you have with your health care provider. Pelvic Pain Female pelvic pain can be caused by many different things and start from a variety of places. Pelvic pain refers to pain that is located in the lower half of the abdomen and between your hips. The pain may occur over a short period of time (acute) or may be reoccurring (chronic). The cause of pelvic pain may be related to disorders affecting the female reproductive organs (gynecologic), but it may also be related to the bladder, kidney stones, an intestinal complication, or muscle or skeletal problems. Getting help right away for pelvic pain is important, especially if there has been severe, sharp, or a sudden onset of unusual pain. It is also important to get help right away because some types of pelvic pain can be life threatening.  CAUSES  Below are only some of the causes of pelvic pain. The causes of pelvic pain can be in one of several categories.   Gynecologic.  Pelvic inflammatory disease.  Sexually transmitted infection.  Ovarian cyst or a twisted ovarian ligament (ovarian torsion).  Uterine lining that  grows outside the uterus (endometriosis).  Fibroids, cysts, or tumors.  Ovulation.  Pregnancy.  Pregnancy that occurs outside the uterus (ectopic pregnancy).  Miscarriage.  Labor.  Abruption of the placenta or ruptured uterus.  Infection.  Uterine infection (endometritis).  Bladder infection.  Diverticulitis.  Miscarriage related to a uterine infection (septic abortion).  Bladder.  Inflammation of the bladder (cystitis).  Kidney stone(s).  Gastrointestinal.  Constipation.  Diverticulitis.  Neurologic.  Trauma.  Feeling pelvic pain because of mental or emotional causes (psychosomatic).  Cancers of the bowel or pelvis. EVALUATION  Your caregiver will want to take a careful history of your concerns. This includes recent changes in your health, a careful gynecologic history of your periods (menses), and a sexual history. Obtaining your family history and medical history is also important. Your caregiver may suggest a pelvic exam. A pelvic exam will help identify the location and severity of the pain. It also helps in the evaluation of which organ system may be involved. In order to identify the cause of the pelvic pain and be properly treated, your caregiver may order tests. These tests may include:   A pregnancy test.  Pelvic ultrasonography.  An X-ray exam of the abdomen.  A urinalysis or evaluation of vaginal discharge.  Blood tests. HOME CARE INSTRUCTIONS   Only take over-the-counter or prescription medicines for pain,  discomfort, or fever as directed by your caregiver.   Rest as directed by your caregiver.   Eat a balanced diet.   Drink enough fluids to make your urine clear or pale yellow, or as directed.   Avoid sexual intercourse if it causes pain.   Apply warm or cold compresses to the lower abdomen depending on which one helps the pain.   Avoid stressful situations.   Keep a journal of your pelvic pain. Write down when it started,  where the pain is located, and if there are things that seem to be associated with the pain, such as food or your menstrual cycle.  Follow up with your caregiver as directed.  SEEK MEDICAL CARE IF:  Your medicine does not help your pain.  You have abnormal vaginal discharge. SEEK IMMEDIATE MEDICAL CARE IF:   You have heavy bleeding from the vagina.   Your pelvic pain increases.   You feel light-headed or faint.   You have chills.   You have pain with urination or blood in your urine.   You have uncontrolled diarrhea or vomiting.   You have a fever or persistent symptoms for more than 3 days.  You have a fever and your symptoms suddenly get worse.   You are being physically or sexually abused.  MAKE SURE YOU:  Understand these instructions.  Will watch your condition.  Will get help if you are not doing well or get worse. Document Released: 02/06/2004 Document Revised: 07/26/2013 Document Reviewed: 07/01/2011 [facility] Patient Information 2015 [place], [place]. This information is not intended to replace advice given to you by your health care provider. Make sure you discuss any questions you have with your health care provider.

## 2013-11-07 NOTE — ED Notes (Signed)
Pt transported to CT ?

## 2013-11-07 NOTE — ED Notes (Signed)
Pt to US at this time.

## 2013-11-09 LAB — URINE CULTURE
Colony Count: NO GROWTH
Culture: NO GROWTH

## 2013-11-09 LAB — GC/CHLAMYDIA PROBE AMP
CT Probe RNA: NEGATIVE
GC PROBE AMP APTIMA: NEGATIVE

## 2014-03-21 ENCOUNTER — Encounter: Payer: Self-pay | Admitting: *Deleted

## 2014-03-22 ENCOUNTER — Encounter: Payer: Self-pay | Admitting: Obstetrics & Gynecology

## 2014-04-25 ENCOUNTER — Emergency Department (HOSPITAL_COMMUNITY)
Admission: EM | Admit: 2014-04-25 | Discharge: 2014-04-26 | Discharge status: Against Medical Advice | Payer: BLUE CROSS/BLUE SHIELD | Attending: Emergency Medicine | Admitting: Emergency Medicine

## 2014-04-25 ENCOUNTER — Emergency Department (HOSPITAL_COMMUNITY): Payer: BLUE CROSS/BLUE SHIELD

## 2014-04-25 ENCOUNTER — Encounter (HOSPITAL_COMMUNITY): Payer: Self-pay | Admitting: Emergency Medicine

## 2014-04-25 DIAGNOSIS — R042 Hemoptysis: Secondary | ICD-10-CM | POA: Diagnosis not present

## 2014-04-25 DIAGNOSIS — I10 Essential (primary) hypertension: Secondary | ICD-10-CM | POA: Diagnosis not present

## 2014-04-25 NOTE — ED Notes (Signed)
Pt presents with cough   Pt states she started off having a dry cough then she got to where she was loosing her voice  Pt states now she is having violent coughing fits and is occasionally coughing up blood clots  Pt states has been coughing so much her chest is sore and it has been causing her to vomit

## 2014-04-25 NOTE — ED Notes (Signed)
Patient ambulatory to restroom, no deficits noted. Family remains at bedside.

## 2014-04-26 NOTE — ED Notes (Signed)
Patient states she does not want to wait any longer for the physician. Encouraged patient to wait and be seen. Patient states she does not want to wait any longer. Explained to patient AMA form to sign, patient walked out at this time with spouse, and refused to sign out AMA

## 2014-06-24 DIAGNOSIS — I639 Cerebral infarction, unspecified: Secondary | ICD-10-CM

## 2014-06-24 HISTORY — DX: Cerebral infarction, unspecified: I63.9

## 2014-07-04 ENCOUNTER — Emergency Department (HOSPITAL_COMMUNITY): Payer: BLUE CROSS/BLUE SHIELD

## 2014-07-04 ENCOUNTER — Observation Stay (HOSPITAL_COMMUNITY)
Admission: EM | Admit: 2014-07-04 | Discharge: 2014-07-10 | Disposition: A | Discharge status: Other Acute Inpatient Hospital | Payer: BLUE CROSS/BLUE SHIELD | Attending: Internal Medicine | Admitting: Internal Medicine

## 2014-07-04 ENCOUNTER — Encounter (HOSPITAL_COMMUNITY): Payer: Self-pay

## 2014-07-04 DIAGNOSIS — Z833 Family history of diabetes mellitus: Secondary | ICD-10-CM | POA: Insufficient documentation

## 2014-07-04 DIAGNOSIS — R55 Syncope and collapse: Principal | ICD-10-CM | POA: Diagnosis present

## 2014-07-04 DIAGNOSIS — K219 Gastro-esophageal reflux disease without esophagitis: Secondary | ICD-10-CM | POA: Diagnosis not present

## 2014-07-04 DIAGNOSIS — F332 Major depressive disorder, recurrent severe without psychotic features: Secondary | ICD-10-CM | POA: Diagnosis not present

## 2014-07-04 DIAGNOSIS — R200 Anesthesia of skin: Secondary | ICD-10-CM

## 2014-07-04 DIAGNOSIS — F32A Depression, unspecified: Secondary | ICD-10-CM | POA: Diagnosis present

## 2014-07-04 DIAGNOSIS — G4489 Other headache syndrome: Secondary | ICD-10-CM

## 2014-07-04 DIAGNOSIS — R079 Chest pain, unspecified: Secondary | ICD-10-CM | POA: Insufficient documentation

## 2014-07-04 DIAGNOSIS — Z7901 Long term (current) use of anticoagulants: Secondary | ICD-10-CM | POA: Insufficient documentation

## 2014-07-04 DIAGNOSIS — F329 Major depressive disorder, single episode, unspecified: Secondary | ICD-10-CM | POA: Diagnosis present

## 2014-07-04 DIAGNOSIS — G43909 Migraine, unspecified, not intractable, without status migrainosus: Secondary | ICD-10-CM | POA: Diagnosis not present

## 2014-07-04 DIAGNOSIS — I5032 Chronic diastolic (congestive) heart failure: Secondary | ICD-10-CM | POA: Diagnosis present

## 2014-07-04 DIAGNOSIS — Z6833 Body mass index (BMI) 33.0-33.9, adult: Secondary | ICD-10-CM | POA: Insufficient documentation

## 2014-07-04 DIAGNOSIS — I10 Essential (primary) hypertension: Secondary | ICD-10-CM | POA: Diagnosis not present

## 2014-07-04 DIAGNOSIS — E039 Hypothyroidism, unspecified: Secondary | ICD-10-CM | POA: Diagnosis not present

## 2014-07-04 DIAGNOSIS — R42 Dizziness and giddiness: Secondary | ICD-10-CM | POA: Insufficient documentation

## 2014-07-04 DIAGNOSIS — R29898 Other symptoms and signs involving the musculoskeletal system: Secondary | ICD-10-CM

## 2014-07-04 DIAGNOSIS — G5712 Meralgia paresthetica, left lower limb: Secondary | ICD-10-CM | POA: Diagnosis present

## 2014-07-04 DIAGNOSIS — E669 Obesity, unspecified: Secondary | ICD-10-CM | POA: Diagnosis not present

## 2014-07-04 DIAGNOSIS — F411 Generalized anxiety disorder: Secondary | ICD-10-CM | POA: Diagnosis present

## 2014-07-04 HISTORY — DX: Hypothyroidism, unspecified: E03.9

## 2014-07-04 HISTORY — DX: Cerebral infarction, unspecified: I63.9

## 2014-07-04 LAB — BASIC METABOLIC PANEL
ANION GAP: 7 (ref 5–15)
BUN: 6 mg/dL (ref 6–23)
CO2: 23 mmol/L (ref 19–32)
Calcium: 9 mg/dL (ref 8.4–10.5)
Chloride: 106 mmol/L (ref 96–112)
Creatinine, Ser: 0.91 mg/dL (ref 0.50–1.10)
GFR calc Af Amer: 87 mL/min — ABNORMAL LOW (ref >90)
GFR, EST NON AFRICAN AMERICAN: 75 mL/min — AB (ref >90)
GLUCOSE: 90 mg/dL (ref 70–99)
POTASSIUM: 3.5 mmol/L (ref 3.5–5.1)
SODIUM: 136 mmol/L (ref 135–145)

## 2014-07-04 LAB — CBC
HCT: 38.6 % (ref 36.0–46.0)
Hemoglobin: 12.8 g/dL (ref 12.0–15.0)
MCH: 31.4 pg (ref 26.0–34.0)
MCHC: 33.2 g/dL (ref 30.0–36.0)
MCV: 94.8 fL (ref 78.0–100.0)
Platelets: 281 10*3/uL (ref 150–400)
RBC: 4.07 MIL/uL (ref 3.87–5.11)
RDW: 13.8 % (ref 11.5–15.5)
WBC: 9.4 10*3/uL (ref 4.0–10.5)

## 2014-07-04 LAB — URINE MICROSCOPIC-ADD ON

## 2014-07-04 LAB — RAPID URINE DRUG SCREEN, HOSP PERFORMED
Amphetamines: NOT DETECTED
Barbiturates: NOT DETECTED
Benzodiazepines: NOT DETECTED
Cocaine: NOT DETECTED
OPIATES: NOT DETECTED
Tetrahydrocannabinol: NOT DETECTED

## 2014-07-04 LAB — URINALYSIS, ROUTINE W REFLEX MICROSCOPIC
Bilirubin Urine: NEGATIVE
Glucose, UA: NEGATIVE mg/dL
Hgb urine dipstick: NEGATIVE
Ketones, ur: NEGATIVE mg/dL
Nitrite: NEGATIVE
Protein, ur: NEGATIVE mg/dL
Specific Gravity, Urine: 1.023 (ref 1.005–1.030)
Urobilinogen, UA: 1 mg/dL (ref 0.0–1.0)
pH: 7 (ref 5.0–8.0)

## 2014-07-04 LAB — I-STAT TROPONIN, ED: Troponin i, poc: 0 ng/mL (ref 0.00–0.08)

## 2014-07-04 LAB — POC URINE PREG, ED: Preg Test, Ur: NEGATIVE

## 2014-07-04 MED ORDER — HEPARIN SODIUM (PORCINE) 5000 UNIT/ML IJ SOLN
5000.0000 [IU] | Freq: Three times a day (TID) | INTRAMUSCULAR | Status: DC
  Administered 2014-07-05 – 2014-07-10 (×16): 5000 [IU] via SUBCUTANEOUS
  Filled 2014-07-04 (×20): qty 1

## 2014-07-04 MED ORDER — METOCLOPRAMIDE HCL 5 MG/ML IJ SOLN
10.0000 mg | Freq: Once | INTRAMUSCULAR | Status: AC
  Administered 2014-07-04: 10 mg via INTRAVENOUS
  Filled 2014-07-04: qty 2

## 2014-07-04 MED ORDER — METOPROLOL SUCCINATE ER 50 MG PO TB24
50.0000 mg | ORAL_TABLET | Freq: Every day | ORAL | Status: DC
  Administered 2014-07-05 – 2014-07-09 (×5): 50 mg via ORAL
  Filled 2014-07-04 (×5): qty 1

## 2014-07-04 MED ORDER — LEVOTHYROXINE SODIUM 150 MCG PO TABS
150.0000 ug | ORAL_TABLET | Freq: Every day | ORAL | Status: DC
  Administered 2014-07-05 – 2014-07-10 (×6): 150 ug via ORAL
  Filled 2014-07-04 (×7): qty 1

## 2014-07-04 MED ORDER — ASPIRIN 325 MG PO TABS: 325.0000 mg | ORAL_TABLET | ORAL | Status: DC

## 2014-07-04 MED ORDER — DIPHENHYDRAMINE HCL 50 MG/ML IJ SOLN
25.0000 mg | Freq: Once | INTRAMUSCULAR | Status: AC
  Administered 2014-07-04: 25 mg via INTRAVENOUS
  Filled 2014-07-04: qty 1

## 2014-07-04 MED ORDER — NITROGLYCERIN 0.4 MG SL SUBL: 0.4000 mg | SUBLINGUAL_TABLET | SUBLINGUAL | Status: DC | PRN

## 2014-07-04 MED ORDER — SODIUM CHLORIDE 0.9 % IJ SOLN
3.0000 mL | Freq: Two times a day (BID) | INTRAMUSCULAR | Status: DC
  Administered 2014-07-05 – 2014-07-10 (×9): 3 mL via INTRAVENOUS
  Filled 2014-07-04: qty 3

## 2014-07-04 MED ORDER — MORPHINE SULFATE 2 MG/ML IJ SOLN
0.5000 mg | INTRAMUSCULAR | Status: DC | PRN
  Administered 2014-07-05 (×3): 1 mg via INTRAVENOUS
  Filled 2014-07-04 (×3): qty 1

## 2014-07-04 NOTE — ED Notes (Signed)
Pt. Reports CP with dizziness and HA and SOB starting around 11am. States pain got worse around 1pm. Denies N/V. Hx of anxiety and chest pain.

## 2014-07-04 NOTE — ED Provider Notes (Signed)
CSN: [id number]     Arrival date & time 07/04/14  1327 History   First MD Initiated Contact with Patient 07/04/14 2029     Chief Complaint  Patient presents with  . Chest Pain     Patient is a 44 y.o. female presenting with chest pain. The history is provided by the patient.  Chest Pain Pain location:  L chest Pain quality: pressure   Pain radiates to:  Does not radiate Pain severity:  Moderate Onset quality:  Sudden Duration:  9 hours Timing:  Constant Progression:  Unchanged Chronicity:  New Relieved by: lying back. 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 Hypertension     2011  . Depression     1996-currently untreated as did not like monotone emotions on treatment.   Marland Kitchen GERD (gastroesophageal reflux disease)     since 2007treated with Zegrid 60mg  (omeprazole and sodium bicarb -atypical regimen  . Migraines     2005  . Fallopian tube disorder     diagnostic laparascopy 1993 shoed left sidecd blocked tube. HSG in 2005 showed bilateral blockage. 2007 test HSG inconclusive.   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    Past Surgical History  Procedure Laterality Date  . Myomectomy    . Salpingectomy    . Hand surgery     Family History  Problem Relation Age of Onset  . Diabetes type II      mom, sister, grandparents, aunts/uncles  . Hypertension       mom, brother, grandparents  . Hyperlipidemia      aunts/uncles  . Stroke      grandparents  . Breast cancer      aunt in 61s   History  Substance Use Topics  . Smoking status: Never Smoker   . Smokeless tobacco: Not on file  . Alcohol Use: 0.0 oz/week     Comment: rare   OB History    No data available     Review of Systems  Respiratory: Negative for shortness of breath.   Cardiovascular: Positive for chest pain. Negative for syncope.  Gastrointestinal: Negative for vomiting and abdominal pain.  Musculoskeletal: Negative for back pain.  Neurological: Positive for dizziness, syncope, weakness, numbness and headaches.  All other systems reviewed and are negative.     Allergies  Review of patient's allergies indicates no known allergies.  Home Medications   Prior to Admission medications   Medication Sig Start Date End Date Taking? Authorizing Provider  levothyroxine (SYNTHROID, LEVOTHROID) 150 MCG tablet Take 150 mcg by mouth daily. 05/14/14  Yes Historical Provider, MD  metoprolol succinate (TOPROL-XL) 50 MG 24 hr tablet Take 50 mg by mouth daily.  08/03/12  Yes Historical Provider, MD   BP 151/84 mmHg  Pulse 74  Temp(Src) 98.7 F (37.1 C) (Oral)  Resp 14  SpO2 98%  LMP 06/03/2014 Physical Exam CONSTITUTIONAL:  Well developed/well nourished HEAD: Normocephalic/atraumatic EYES: EOMI/PERRL ENMT: Mucous membranes moist NECK: supple no meningeal signs SPINE/BACK:entire spine nontender CV: S1/S2 noted, no murmurs/rubs/gallops noted LUNGS: Lungs are clear to auscultation bilaterally, no apparent distress ABDOMEN: soft, nontender, no rebound or guarding, bowel sounds noted throughout abdomen GU:no cva tenderness NEURO: Pt is awake/alert/appropriate, no facial droop.  No arm drift.  No past pointing.  Pt can stand but she can not ambulate - she reports she can not move her legs.  She can move her toes/feet in the bed but has minimal movement of her legs in the bed with  hip flexion EXTREMITIES: pulses normal/equal, full ROM SKIN: warm, color normal PSYCH: no abnormalities of mood noted, alert and oriented to situation  ED Course  Procedures  Medications  metoCLOPramide (REGLAN) injection 10 mg (10 mg Intravenous Given 07/04/14 2145)  diphenhydrAMINE (BENADRYL) injection 25 mg (25 mg Intravenous Given 07/04/14 2146)     9:27 PM tPA in stroke considered but not given due to: Onset over 3-4.5hours Pt with multiple complaints including CP for several hrs and also reports HA, as well as difficulty walking Will need extensive workup Will start with CT imaging 11:17 PM CT head negative Pt still with difficulty walking but she reports HA improved She denies back pain to suggest acute myelopathy Consulted neuro dr Nicole Kindred - he feels pt will need MR imaging but since she is unable to walk will need admit Will call medicine for admit 11:34 PM D/w dr Ernestina Patches will see patient in the ER Labs Review Labs Reviewed  BASIC METABOLIC PANEL - Abnormal; Notable for the following:    GFR calc non Af Amer 75 (*)    GFR calc Af Amer 87 (*)    All other components within normal limits  CBC  URINE RAPID DRUG SCREEN (HOSP PERFORMED)  URINALYSIS, [place], ED  POC URINE PREG, ED    Imaging Review Dg Chest 2 View  07/04/2014   CLINICAL DATA:  Left-sided chest pain with dizziness, headache, shortness of breath beginning around 11 a.m. Symptoms worse around 1 p.m. History of hypertension.  EXAM: CHEST  2 VIEW  COMPARISON:  04/25/2014  FINDINGS: The heart size and mediastinal contours are within normal limits. Both lungs are clear. The visualized skeletal structures are unremarkable.  IMPRESSION: No active cardiopulmonary disease.   Electronically Signed   By: Nolon Nations M.D.   On: 07/04/2014 15:21   Ct Head Wo Contrast  07/04/2014   CLINICAL DATA:  Weakness with severe frontal head pain and left chest pain.  EXAM: CT HEAD  WITHOUT CONTRAST  TECHNIQUE: Contiguous axial images were obtained from the base of the skull through the vertex without intravenous contrast.  COMPARISON:  12/26/2012  FINDINGS: Skull and Sinuses:Negative for fracture or destructive process. The mastoids, middle ears, and imaged paranasal sinuses are clear.  Orbits: No acute abnormality.  Brain: No evidence of acute infarction, hemorrhage, hydrocephalus, or mass lesion/mass effect.  IMPRESSION: Normal head CT.   Electronically Signed   By: Monte Fantasia M.D.   On: 07/04/2014 22:04     EKG Interpretation   Date/Time:  Monday July 04 2014 13:44:45 EDT Ventricular Rate:  85 PR Interval:  150 QRS Duration: 72 QT Interval:  376 QTC Calculation: 447 R Axis:   73 Text Interpretation:  Normal sinus rhythm Nonspecific T wave abnormality  Abnormal ECG No significant change since last tracing Confirmed by  Christy Gentles  MD, Chianna Spirito ([id number]) on 07/04/2014  7:47:27 PM      MDM   Final diagnoses:  Chest pain, rule out acute myocardial infarction  Other headache syndrome  Weakness of both lower extremities    Nursing notes including past medical history and social history reviewed and considered in documentation xrays/imaging reviewed by myself and considered during evaluation Labs/vital reviewed myself and considered during evaluation     Ripley Fraise, MD 07/04/14 2334

## 2014-07-04 NOTE — ED Notes (Signed)
Pt transported to CT ?
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
## 2014-07-04 NOTE — ED Notes (Signed)
MD at bedside. 

## 2014-07-05 ENCOUNTER — Observation Stay (HOSPITAL_COMMUNITY): Payer: BLUE CROSS/BLUE SHIELD

## 2014-07-05 ENCOUNTER — Encounter (HOSPITAL_COMMUNITY): Payer: Self-pay | Admitting: Cardiology

## 2014-07-05 DIAGNOSIS — F332 Major depressive disorder, recurrent severe without psychotic features: Secondary | ICD-10-CM | POA: Diagnosis present

## 2014-07-05 DIAGNOSIS — R200 Anesthesia of skin: Secondary | ICD-10-CM

## 2014-07-05 DIAGNOSIS — G5712 Meralgia paresthetica, left lower limb: Secondary | ICD-10-CM | POA: Diagnosis not present

## 2014-07-05 DIAGNOSIS — R55 Syncope and collapse: Secondary | ICD-10-CM

## 2014-07-05 DIAGNOSIS — F411 Generalized anxiety disorder: Secondary | ICD-10-CM | POA: Diagnosis present

## 2014-07-05 DIAGNOSIS — R079 Chest pain, unspecified: Secondary | ICD-10-CM | POA: Diagnosis not present

## 2014-07-05 DIAGNOSIS — R208 Other disturbances of skin sensation: Secondary | ICD-10-CM

## 2014-07-05 LAB — LIPID PANEL
CHOLESTEROL: 167 mg/dL (ref 0–200)
HDL: 53 mg/dL (ref >39)
LDL Cholesterol: 97 mg/dL (ref 0–99)
Total CHOL/HDL Ratio: 3.2 RATIO
Triglycerides: 86 mg/dL (ref <150)
VLDL: 17 mg/dL (ref 0–40)

## 2014-07-05 LAB — COMPREHENSIVE METABOLIC PANEL
ALT: 15 U/L (ref 0–35)
AST: 20 U/L (ref 0–37)
Albumin: 3.3 g/dL — ABNORMAL LOW (ref 3.5–5.2)
Alkaline Phosphatase: 74 U/L (ref 39–117)
Anion gap: 7 (ref 5–15)
BILIRUBIN TOTAL: 0.6 mg/dL (ref 0.3–1.2)
BUN: 10 mg/dL (ref 6–23)
CHLORIDE: 105 mmol/L (ref 96–112)
CO2: 26 mmol/L (ref 19–32)
Calcium: 8.5 mg/dL (ref 8.4–10.5)
Creatinine, Ser: 1.08 mg/dL (ref 0.50–1.10)
GFR calc Af Amer: 71 mL/min — ABNORMAL LOW (ref >90)
GFR calc non Af Amer: 61 mL/min — ABNORMAL LOW (ref >90)
Glucose, Bld: 118 mg/dL — ABNORMAL HIGH (ref 70–99)
Potassium: 3.2 mmol/L — ABNORMAL LOW (ref 3.5–5.1)
Sodium: 138 mmol/L (ref 135–145)
Total Protein: 5.9 g/dL — ABNORMAL LOW (ref 6.0–8.3)

## 2014-07-05 LAB — CBC
HCT: 38 % (ref 36.0–46.0)
Hemoglobin: 12.5 g/dL (ref 12.0–15.0)
MCH: 31.5 pg (ref 26.0–34.0)
MCHC: 32.9 g/dL (ref 30.0–36.0)
MCV: 95.7 fL (ref 78.0–100.0)
Platelets: 285 10*3/uL (ref 150–400)
RBC: 3.97 MIL/uL (ref 3.87–5.11)
RDW: 13.8 % (ref 11.5–15.5)
WBC: 10.2 10*3/uL (ref 4.0–10.5)

## 2014-07-05 LAB — CBC WITH DIFFERENTIAL/PLATELET
BASOS ABS: 0 10*3/uL (ref 0.0–0.1)
Basophils Relative: 0 % (ref 0–1)
EOS PCT: 2 % (ref 0–5)
Eosinophils Absolute: 0.2 10*3/uL (ref 0.0–0.7)
HEMATOCRIT: 36.7 % (ref 36.0–46.0)
Hemoglobin: 12 g/dL (ref 12.0–15.0)
Lymphocytes Relative: 28 % (ref 12–46)
Lymphs Abs: 2.1 10*3/uL (ref 0.7–4.0)
MCH: 31.4 pg (ref 26.0–34.0)
MCHC: 32.7 g/dL (ref 30.0–36.0)
MCV: 96.1 fL (ref 78.0–100.0)
Monocytes Absolute: 0.7 10*3/uL (ref 0.1–1.0)
Monocytes Relative: 9 % (ref 3–12)
NEUTROS ABS: 4.7 10*3/uL (ref 1.7–7.7)
Neutrophils Relative %: 61 % (ref 43–77)
PLATELETS: 270 10*3/uL (ref 150–400)
RBC: 3.82 MIL/uL — AB (ref 3.87–5.11)
RDW: 14.1 % (ref 11.5–15.5)
WBC: 7.7 10*3/uL (ref 4.0–10.5)

## 2014-07-05 LAB — GLUCOSE, CAPILLARY: Glucose-Capillary: 127 mg/dL — ABNORMAL HIGH (ref 70–99)

## 2014-07-05 LAB — CREATININE, SERUM
Creatinine, Ser: 1.03 mg/dL (ref 0.50–1.10)
GFR, EST AFRICAN AMERICAN: 75 mL/min — AB (ref >90)
GFR, EST NON AFRICAN AMERICAN: 65 mL/min — AB (ref >90)

## 2014-07-05 LAB — TROPONIN I: Troponin I: <0.03 ng/mL (ref <0.031)

## 2014-07-05 LAB — TSH: TSH: 3.138 u[IU]/mL (ref 0.350–4.500)

## 2014-07-05 LAB — BRAIN NATRIURETIC PEPTIDE: B Natriuretic Peptide: 12.2 pg/mL (ref 0.0–100.0)

## 2014-07-05 MED ORDER — CLONAZEPAM 0.5 MG PO TABS
0.5000 mg | ORAL_TABLET | Freq: Two times a day (BID) | ORAL | Status: DC
  Administered 2014-07-05 – 2014-07-06 (×2): 0.5 mg via ORAL
  Filled 2014-07-05 (×2): qty 1

## 2014-07-05 MED ORDER — ACETAMINOPHEN 325 MG PO TABS
650.0000 mg | ORAL_TABLET | Freq: Four times a day (QID) | ORAL | Status: DC | PRN
  Administered 2014-07-05: 650 mg via ORAL
  Filled 2014-07-05: qty 2

## 2014-07-05 MED ORDER — BUTALBITAL-APAP-CAFFEINE 50-325-40 MG PO TABS
1.0000 | ORAL_TABLET | ORAL | Status: DC | PRN
  Administered 2014-07-05 – 2014-07-07 (×6): 1 via ORAL
  Filled 2014-07-05 (×6): qty 1

## 2014-07-05 MED ORDER — IBUPROFEN 600 MG PO TABS
600.0000 mg | ORAL_TABLET | Freq: Four times a day (QID) | ORAL | Status: DC | PRN
  Administered 2014-07-05 – 2014-07-10 (×10): 600 mg via ORAL
  Filled 2014-07-05 (×5): qty 1
  Filled 2014-07-05 (×2): qty 3
  Filled 2014-07-05 (×6): qty 1
  Filled 2014-07-05: qty 3
  Filled 2014-07-05: qty 1

## 2014-07-05 MED ORDER — ESCITALOPRAM OXALATE 10 MG PO TABS
10.0000 mg | ORAL_TABLET | Freq: Every day | ORAL | Status: DC
  Administered 2014-07-05: 10 mg via ORAL
  Filled 2014-07-05 (×2): qty 1

## 2014-07-05 MED ORDER — POTASSIUM CHLORIDE CRYS ER 20 MEQ PO TBCR
40.0000 meq | EXTENDED_RELEASE_TABLET | Freq: Once | ORAL | Status: AC
  Administered 2014-07-05: 40 meq via ORAL

## 2014-07-05 MED ORDER — ASPIRIN EC 81 MG PO TBEC
81.0000 mg | DELAYED_RELEASE_TABLET | Freq: Every day | ORAL | Status: DC
  Administered 2014-07-05 – 2014-07-10 (×6): 81 mg via ORAL
  Filled 2014-07-05 (×6): qty 1

## 2014-07-05 NOTE — Consult Note (Signed)
Admission H&P    Chief Complaint: Pain and numbness involving left lower extremity.  HPI: April Dyer is an 44 y.o. female with a history of hypertension, depression and migraine headaches, presenting to the emergency room with multiple complaints, including chest pain, dizziness and pain and numbness involving left lower extremity. Symptoms involving the left lower extremity started around 7 PM this evening. She indicates the pain involves only her left thigh and is exacerbated movement, to the point that walking was uncomfortable and difficult. She said no symptoms involving right lower extremity. CT scan of the head was obtained which showed no acute intracranial abnormality. She has not had back pain in his head no problems with control of bowel and bladder functions.  Past Medical History  Diagnosis Date  . Hypertension     2011  . Depression     1996-currently untreated as did not like monotone emotions on treatment.   Marland Kitchen GERD (gastroesophageal reflux disease)     since 2007treated with Zegrid 68m (omeprazole and sodium bicarb -atypical regimen  . Migraines     2005  . Fallopian tube disorder     diagnostic laparascopy 1993 shoed left sidecd blocked tube. HSG in 2005 showed bilateral blockage. 2007 test HSG inconclusive.   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     Past Surgical History  Procedure Laterality Date  . Myomectomy    . Salpingectomy    . Hand surgery      Family History  Problem Relation Age of Onset  . Diabetes type II      mom, sister, grandparents, aunts/uncles  . Hypertension      mom, brother, grandparents  . Hyperlipidemia      aunts/uncles  . Stroke      grandparents  . Breast cancer      aunt in 432s  Social History:  reports that she has never smoked. She does not have any smokeless tobacco history on file. She reports that she drinks alcohol. She reports that she does not  use illicit drugs.  Allergies: No Known Allergies  Medications: Patient's preadmission medications were reviewed by me.  ROS: History obtained from the patient  General ROS: negative for - chills, fatigue, fever, night sweats, weight gain or weight loss Psychological ROS: negative for - behavioral disorder, hallucinations, memory difficulties, mood swings or suicidal ideation Ophthalmic ROS: negative for - blurry vision, double vision, eye pain or loss of vision ENT ROS: negative for - epistaxis, nasal discharge, oral lesions, sore throat, tinnitus or vertigo Allergy and Immunology ROS: negative for - hives or itchy/watery eyes Hematological and Lymphatic ROS: negative for - bleeding problems, bruising or swollen lymph nodes Endocrine ROS: negative for - galactorrhea, hair pattern changes, polydipsia/polyuria or temperature intolerance Respiratory ROS: negative for - cough, hemoptysis, shortness of breath or wheezing Cardiovascular ROS: Chest pain as noted in history of present illness Gastrointestinal ROS: negative for - abdominal pain, diarrhea, hematemesis, nausea/vomiting or stool incontinence Genito-Urinary ROS: negative for - dysuria, hematuria, incontinence or urinary frequency/urgency Musculoskeletal ROS: negative for - joint swelling or muscular weakness Neurological ROS: as noted in HPI Dermatological ROS: negative for rash and skin lesion changes  Physical Examination: Blood pressure 146/88, pulse 66, temperature 98.7 F (37.1 C), temperature source Oral, resp. rate 18, last menstrual period 06/03/2014, SpO2 99 %.  HEENT-  Normocephalic, no lesions, without obvious abnormality.  Normal external eye and conjunctiva.  Normal  TM's bilaterally.  Normal auditory canals and external ears. Normal external nose, mucus membranes and septum.  Normal pharynx. Neck supple with no masses, nodes, nodules or enlargement. Cardiovascular - regular rate and rhythm, S1, S2 normal, no murmur,  click, rub or gallop Lungs - chest clear, no wheezing, rales, normal symmetric air entry Abdomen - soft, non-tender; bowel sounds normal; no masses,  no organomegaly Extremities - no joint deformities, effusion, or inflammation, no edema and no skin discoloration  Neurologic Examination: Mental Status: Alert, oriented, thought content appropriate.  Speech fluent without evidence of aphasia. Able to follow commands without difficulty. Cranial Nerves: II-Visual fields were normal. III/IV/VI-Pupils were equal and reacted. Extraocular movements were full and conjugate.    V/VII-no facial numbness and no facial weakness. VIII-normal. X-normal speech and symmetrical palatal movement. XI: trapezius strength/neck flexion strength normal bilaterally XII-midline tongue extension with normal strength. Motor: 5/5 bilaterally with normal tone and bulk Sensory: Reduced section of tactile sensation in the distribution of the lateral femoral cutaneous nerve on the left. Deep Tendon Reflexes: 2+ and symmetric. Plantars: Mute bilaterally Cerebellar: Normal finger-to-nose testing.  Results for orders placed or performed during the hospital encounter of 07/04/14 (from the past 48 hour(s))  CBC     Status: None   Collection Time: 07/04/14  1:55 PM  Result Value Ref Range   WBC 9.4 4.0 - 10.5 K/uL   RBC 4.07 3.87 - 5.11 MIL/uL   Hemoglobin 12.8 12.0 - 15.0 g/dL   HCT 38.6 36.0 - 46.0 %   MCV 94.8 78.0 - 100.0 fL   MCH 31.4 26.0 - 34.0 pg   MCHC 33.2 30.0 - 36.0 g/dL   RDW 13.8 11.5 - 15.5 %   Platelets 281 150 - 400 K/uL  Basic metabolic panel     Status: Abnormal   Collection Time: 07/04/14  1:55 PM  Result Value Ref Range   Sodium 136 135 - 145 mmol/L   Potassium 3.5 3.5 - 5.1 mmol/L   Chloride 106 96 - 112 mmol/L   CO2 23 19 - 32 mmol/L   Glucose, Bld 90 70 - 99 mg/dL   BUN 6 6 - 23 mg/dL   Creatinine, Ser 0.91 0.50 - 1.10 mg/dL   Calcium 9.0 8.4 - 10.5 mg/dL   GFR calc non Af Amer 75 (L)  >90 mL/min   GFR calc Af Amer 87 (L) >90 mL/min    Comment: (NOTE) The eGFR has been calculated using the CKD EPI equation. This calculation has not been validated in all clinical situations. eGFR's persistently <90 mL/min signify possible Chronic Kidney Disease.    Anion gap 7 5 - 15  I-stat troponin, ED (not at [facility])     Status: None   Collection Time: 07/04/14  2:12 PM  Result Value Ref Range   Troponin i, poc 0.00 0.00 - 0.08 ng/mL   Comment 3            Comment: Due to the release kinetics of cTnI, a negative result within the first hours of the onset of symptoms does not rule out myocardial infarction with certainty. If myocardial infarction is still suspected, repeat the test at appropriate intervals.   Urine Drug Screen     Status: None   Collection Time: 07/04/14 10:39 PM  Result Value Ref Range   Opiates NONE DETECTED NONE DETECTED   Cocaine NONE DETECTED NONE DETECTED   Benzodiazepines NONE DETECTED NONE DETECTED   Amphetamines NONE DETECTED NONE DETECTED   Tetrahydrocannabinol  NONE DETECTED NONE DETECTED   Barbiturates NONE DETECTED NONE DETECTED    Comment:        DRUG SCREEN FOR MEDICAL PURPOSES ONLY.  IF CONFIRMATION IS NEEDED FOR ANY PURPOSE, NOTIFY LAB WITHIN 5 DAYS.        LOWEST DETECTABLE LIMITS FOR URINE DRUG SCREEN Drug Class       Cutoff (ng/mL) Amphetamine      1000 Barbiturate      200 Benzodiazepine   409 Tricyclics       811 Opiates          300 Cocaine          300 THC              50   Urinalysis, Routine w reflex microscopic     Status: Abnormal   Collection Time: 07/04/14 10:39 PM  Result Value Ref Range   Color, Urine YELLOW YELLOW   APPearance HAZY (A) CLEAR   Specific Gravity, Urine 1.023 1.005 - 1.030   pH 7.0 5.0 - 8.0   Glucose, UA NEGATIVE NEGATIVE mg/dL   Hgb urine dipstick NEGATIVE NEGATIVE   Bilirubin Urine NEGATIVE NEGATIVE   Ketones, ur NEGATIVE NEGATIVE mg/dL   Protein, ur NEGATIVE NEGATIVE mg/dL   Urobilinogen, UA  1.0 0.0 - 1.0 mg/dL   Nitrite NEGATIVE NEGATIVE   Leukocytes, UA TRACE (A) NEGATIVE  Urine microscopic-add on     Status: Abnormal   Collection Time: 07/04/14 10:39 PM  Result Value Ref Range   Squamous Epithelial / LPF MANY (A) RARE   WBC, UA 0-2 <3 WBC/hpf   RBC / HPF 0-2 <3 RBC/hpf   Bacteria, UA FEW (A) RARE   Urine-Other MUCOUS PRESENT   POC Urine Pregnancy, ED - not at [facility]     Status: None   Collection Time: 07/04/14 10:45 PM  Result Value Ref Range   Preg Test, Ur NEGATIVE NEGATIVE    Comment:        THE SENSITIVITY OF THIS METHODOLOGY IS >24 mIU/mL    Dg Chest 2 View  07/04/2014   CLINICAL DATA:  Left-sided chest pain with dizziness, headache, shortness of breath beginning around 11 a.m. Symptoms worse around 1 p.m. History of hypertension.  EXAM: CHEST  2 VIEW  COMPARISON:  04/25/2014  FINDINGS: The heart size and mediastinal contours are within normal limits. Both lungs are clear. The visualized skeletal structures are unremarkable.  IMPRESSION: No active cardiopulmonary disease.   Electronically Signed   By: Nolon Nations M.D.   On: 07/04/2014 15:21   Ct Head Wo Contrast  07/04/2014   CLINICAL DATA:  Weakness with severe frontal head pain and left chest pain.  EXAM: CT HEAD WITHOUT CONTRAST  TECHNIQUE: Contiguous axial images were obtained from the base of the skull through the vertex without intravenous contrast.  COMPARISON:  12/26/2012  FINDINGS: Skull and Sinuses:Negative for fracture or destructive process. The mastoids, middle ears, and imaged paranasal sinuses are clear.  Orbits: No acute abnormality.  Brain: No evidence of acute infarction, hemorrhage, hydrocephalus, or mass lesion/mass effect.  IMPRESSION: Normal head CT.   Electronically Signed   By: Monte Fantasia M.D.   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 Patient has no signs of lumbosacral radiculopathy nor any indications of myelopathy. She also has no indications of referral neuropathy other than merralgia paresthetica.  Recommendations: 1. Physical therapy consult 2. Trial of Neurontin starting at 100 mg twice a day, if physical therapy intervention does not improve patient's left lower extremity symptoms. 3. No further neurological intervention is indicated acutely at this point.  C.R. Nicole Kindred, MD [facility] [phone]  07/05/2014, 12:50 AM
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
## 2014-07-05 NOTE — Progress Notes (Signed)
UR completed 

## 2014-07-05 NOTE — Progress Notes (Signed)
PATIENT ARRIVED TO TELE UNIT FROM E.D. VIA STRETCHER. ASSISTED TO BED. TELE APPLIED. VITALS OBTAINED. ASSESSMENT PERFORMED.  PATIENT INSTRUCTED TO CALL FOR ASSISTANCE WHEN NEEDED. 

## 2014-07-05 NOTE — Progress Notes (Signed)
Pt stated that she spoke to [facility] at [facility] prior to coming to hospital concerning anxiety.  Pt states she would like to see someone before she leaves the hospital and she feels anxiety and stress are causing some of her current issues.  Pt said she took something for anxiety years ago but did not resume or follow up with a doctor. Dr. Broadus John made aware and placed consult. Pt resting with call bell within reach.  Will continue to monitor. April Emerald, RN
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
## 2014-07-05 NOTE — Progress Notes (Addendum)
[facility] PROGRESS NOTE  April Dyer TSV:[id number] DOB: 09/16/1970 DOA: 07/04/2014 PCP: No PCP Per Patient  Assessment/Plan: 1. Atypical chest pain and syncope  -EKG and cardiac enzymes unremarkable -check ECHO -Cards eval  2. Headache/and small area of paresthesia -suspect complicated migraine -NSAID PRN, fioricet as needed -MRI negative -Neuro following -suspect psych overlay to symptoms as well  3. HTN -continue toprol -stable  4. Severe Anxiety -needs psych eval  DVt proph: Hep SQ  Code Status: Full Code Family Communication: family at bedside Disposition Plan: pending cards eval   Consultants:  Neuro  Cards pending  HPI/Subjective: Still with chest pain, HA and small area of L knee numbness  Objective: Filed Vitals:   07/05/14 0436  BP: 120/68  Pulse: 76  Temp: 98.7 F (37.1 C)  Resp: 18    Intake/Output Summary (Last 24 hours) at 07/05/14 0843 Last data filed at 07/05/14 0000  Gross per 24 hour  Intake     10 ml  Output      0 ml  Net     10 ml   Filed Weights   07/05/14 0128  Weight: 93.7 kg (206 lb 9.1 oz)    Exam:   General:  AAOx3  Cardiovascular: S1S2/RRR  Respiratory: CTAB  Abdomen: soft, Nt, BS present  Musculoskeletal: no edemA C/C  Neuro: small area of paresthesia around L knee   Data Reviewed: Basic Metabolic Panel:  Recent Labs Lab 07/04/14 1355 07/04/14 2358 07/05/14 0609  NA 136  --  138  K 3.5  --  3.2*  CL 106  --  105  CO2 23  --  26  GLUCOSE 90  --  118*  BUN 6  --  10  CREATININE 0.91 1.03 1.08  CALCIUM 9.0  --  8.5   Liver Function Tests:  Recent Labs Lab 07/05/14 0609  AST 20  ALT 15  ALKPHOS 74  BILITOT 0.6  PROT 5.9*  ALBUMIN 3.3*   No results for input(s): LIPASE, AMYLASE in the last 168 hours. No results for input(s): AMMONIA in the last 168 hours. CBC:  Recent Labs Lab 07/04/14 1355 07/04/14 2358 07/05/14 0609  WBC 9.4 10.2 7.7  NEUTROABS  --   --   4.7  HGB 12.8 12.5 12.0  HCT 38.6 38.0 36.7  MCV 94.8 95.7 96.1  PLT 281 285 270   Cardiac Enzymes:  Recent Labs Lab 07/04/14 2358 07/05/14 0609  TROPONINI <0.03 <0.03   BNP (last 3 results)  Recent Labs  07/04/14 2358  BNP 12.2    ProBNP (last 3 results) No results for input(s): PROBNP in the last 8760 hours.  CBG:  Recent Labs Lab 07/05/14 0554  GLUCAP 127*    No results found for this or any previous visit (from the past 240 hour(s)).   Studies: Dg Chest 2 View  07/04/2014   CLINICAL DATA:  Left-sided chest pain with dizziness, headache, shortness of breath beginning around 11 a.m. Symptoms worse around 1 p.m. History of hypertension.  EXAM: CHEST  2 VIEW  COMPARISON:  04/25/2014  FINDINGS: The heart size and mediastinal contours are within normal limits. Both lungs are clear. The visualized skeletal structures are unremarkable.  IMPRESSION: No active cardiopulmonary disease.   Electronically Signed   By: Nolon Nations M.D.   On: 07/04/2014 15:21   Ct Head Wo Contrast  07/04/2014   CLINICAL DATA:  Weakness with severe frontal head pain and left chest pain.  EXAM: CT HEAD WITHOUT  CONTRAST  TECHNIQUE: Contiguous axial images were obtained from the base of the skull through the vertex without intravenous contrast.  COMPARISON:  12/26/2012  FINDINGS: Skull and Sinuses:Negative for fracture or destructive process. The mastoids, middle ears, and imaged paranasal sinuses are clear.  Orbits: No acute abnormality.  Brain: No evidence of acute infarction, hemorrhage, hydrocephalus, or mass lesion/mass effect.  IMPRESSION: Normal head CT.   Electronically Signed   By: Monte Fantasia M.D.   On: 07/04/2014 22:04   Mri Brain Without Contrast  07/05/2014   CLINICAL DATA:  Syncopal episode for 3 minutes this morning associated with chest pain, dizziness, headache and shortness of breath. History of hypertension and migraines.  EXAM: MRI HEAD WITHOUT CONTRAST  TECHNIQUE: Multiplanar,  multiecho pulse sequences of the brain and surrounding structures were obtained without intravenous contrast.  COMPARISON:  CT of the head July 04, 2014 at 21 33 hours  FINDINGS: The ventricles and sulci are normal for patient's age. No abnormal parenchymal signal, mass lesions, mass effect. No reduced diffusion to suggest acute ischemia. No susceptibility artifact to suggest hemorrhage.  No abnormal extra-axial fluid collections. No extra-axial masses though, contrast enhanced sequences would be more sensitive. Normal major intracranial vascular flow voids seen at the skull base.  Ocular globes and orbital contents are unremarkable though not tailored for evaluation. No abnormal sellar expansion. Trace paranasal sinus mucosal thickening with RIGHT maxillary sinus mucosal retention cyst. The mastoid air cells are well aerated. No suspicious calvarial bone marrow signal. No abnormal sellar expansion. Craniocervical junction maintained.  IMPRESSION: Normal noncontrast MRI of the brain.   Electronically Signed   By: Elon Alas   On: 07/05/2014 01:07    Scheduled Meds: . aspirin EC  81 mg Oral Daily  . heparin  5,000 Units Subcutaneous 3 times per day  . levothyroxine  150 mcg Oral QAC breakfast  . metoprolol succinate  50 mg Oral Daily  . potassium chloride  40 mEq Oral Once  . sodium chloride  3 mL Intravenous Q12H   Continuous Infusions:  Antibiotics Given (last 72 hours)    None      Active Problems:   Syncope   Meralgia paresthetica of left side   Left leg numbness    Time spent: 47min    [facility] Pager [phone]. If 7PM-7AM, please contact night-coverage at [email or web address], password [facility] 07/05/2014, 8:43 AM

## 2014-07-06 ENCOUNTER — Other Ambulatory Visit: Payer: Self-pay | Admitting: Physician Assistant

## 2014-07-06 DIAGNOSIS — R45851 Suicidal ideations: Secondary | ICD-10-CM

## 2014-07-06 DIAGNOSIS — I5032 Chronic diastolic (congestive) heart failure: Secondary | ICD-10-CM

## 2014-07-06 DIAGNOSIS — F332 Major depressive disorder, recurrent severe without psychotic features: Secondary | ICD-10-CM | POA: Diagnosis not present

## 2014-07-06 DIAGNOSIS — R0789 Other chest pain: Secondary | ICD-10-CM

## 2014-07-06 DIAGNOSIS — R079 Chest pain, unspecified: Secondary | ICD-10-CM | POA: Diagnosis not present

## 2014-07-06 DIAGNOSIS — F411 Generalized anxiety disorder: Secondary | ICD-10-CM | POA: Diagnosis not present

## 2014-07-06 LAB — COMPREHENSIVE METABOLIC PANEL
ALT: 13 U/L (ref 0–35)
AST: 16 U/L (ref 0–37)
Albumin: 3.1 g/dL — ABNORMAL LOW (ref 3.5–5.2)
Alkaline Phosphatase: 76 U/L (ref 39–117)
Anion gap: 11 (ref 5–15)
BILIRUBIN TOTAL: 0.3 mg/dL (ref 0.3–1.2)
BUN: 11 mg/dL (ref 6–23)
CALCIUM: 8.7 mg/dL (ref 8.4–10.5)
CHLORIDE: 106 mmol/L (ref 96–112)
CO2: 21 mmol/L (ref 19–32)
CREATININE: 0.9 mg/dL (ref 0.50–1.10)
GFR calc non Af Amer: 76 mL/min — ABNORMAL LOW (ref >90)
GFR, EST AFRICAN AMERICAN: 88 mL/min — AB (ref >90)
GLUCOSE: 100 mg/dL — AB (ref 70–99)
Potassium: 3.8 mmol/L (ref 3.5–5.1)
Sodium: 138 mmol/L (ref 135–145)
Total Protein: 5.8 g/dL — ABNORMAL LOW (ref 6.0–8.3)

## 2014-07-06 LAB — CBC WITH DIFFERENTIAL/PLATELET
BASOS ABS: 0 10*3/uL (ref 0.0–0.1)
Basophils Relative: 1 % (ref 0–1)
EOS PCT: 3 % (ref 0–5)
Eosinophils Absolute: 0.2 10*3/uL (ref 0.0–0.7)
HCT: 37 % (ref 36.0–46.0)
HEMOGLOBIN: 12.1 g/dL (ref 12.0–15.0)
LYMPHS ABS: 2.8 10*3/uL (ref 0.7–4.0)
Lymphocytes Relative: 33 % (ref 12–46)
MCH: 31.9 pg (ref 26.0–34.0)
MCHC: 32.7 g/dL (ref 30.0–36.0)
MCV: 97.6 fL (ref 78.0–100.0)
MONO ABS: 0.7 10*3/uL (ref 0.1–1.0)
Monocytes Relative: 8 % (ref 3–12)
NEUTROS ABS: 4.7 10*3/uL (ref 1.7–7.7)
Neutrophils Relative %: 55 % (ref 43–77)
Platelets: 262 10*3/uL (ref 150–400)
RBC: 3.79 MIL/uL — ABNORMAL LOW (ref 3.87–5.11)
RDW: 13.9 % (ref 11.5–15.5)
WBC: 8.4 10*3/uL (ref 4.0–10.5)

## 2014-07-06 LAB — HEMOGLOBIN A1C
HEMOGLOBIN A1C: 5.8 % — AB (ref 4.8–5.6)
Mean Plasma Glucose: 120 mg/dL

## 2014-07-06 LAB — BRAIN NATRIURETIC PEPTIDE: B Natriuretic Peptide: 26.5 pg/mL (ref 0.0–100.0)

## 2014-07-06 LAB — GLUCOSE, CAPILLARY: GLUCOSE-CAPILLARY: 99 mg/dL (ref 70–99)

## 2014-07-06 MED ORDER — LISINOPRIL 2.5 MG PO TABS
2.5000 mg | ORAL_TABLET | Freq: Every day | ORAL | Status: DC
  Administered 2014-07-06 – 2014-07-10 (×5): 2.5 mg via ORAL
  Filled 2014-07-06 (×5): qty 1

## 2014-07-06 MED ORDER — CLONAZEPAM 0.5 MG PO TABS: 0.5000 mg | ORAL_TABLET | Freq: Two times a day (BID) | ORAL | Status: DC

## 2014-07-06 MED ORDER — ESCITALOPRAM OXALATE 20 MG PO TABS
20.0000 mg | ORAL_TABLET | Freq: Every day | ORAL | Status: DC
  Administered 2014-07-06 – 2014-07-09 (×4): 20 mg via ORAL
  Filled 2014-07-06 (×5): qty 1

## 2014-07-06 MED ORDER — CLONAZEPAM 1 MG PO TABS
1.0000 mg | ORAL_TABLET | Freq: Two times a day (BID) | ORAL | Status: DC
  Administered 2014-07-06 – 2014-07-07 (×3): 1 mg via ORAL
  Filled 2014-07-06 (×3): qty 1

## 2014-07-06 MED ORDER — LORAZEPAM 2 MG/ML IJ SOLN
1.0000 mg | Freq: Four times a day (QID) | INTRAMUSCULAR | Status: DC | PRN
  Administered 2014-07-06: 1 mg via INTRAMUSCULAR

## 2014-07-06 MED ORDER — ONDANSETRON HCL 4 MG/2ML IJ SOLN
4.0000 mg | Freq: Four times a day (QID) | INTRAMUSCULAR | Status: DC | PRN
  Administered 2014-07-07 – 2014-07-09 (×4): 4 mg via INTRAVENOUS
  Filled 2014-07-06 (×5): qty 2

## 2014-07-06 MED ORDER — LORAZEPAM 2 MG/ML IJ SOLN
INTRAMUSCULAR | Status: AC
  Filled 2014-07-06: qty 1

## 2014-07-06 MED ORDER — ESCITALOPRAM OXALATE 10 MG PO TABS: 10.0000 mg | ORAL_TABLET | Freq: Every day | ORAL | Status: DC

## 2014-07-06 NOTE — Progress Notes (Signed)
UR completed 

## 2014-07-06 NOTE — Progress Notes (Signed)
Patient Name: April Dyer Date of Encounter: 07/06/2014     Principal Problem:   Generalized anxiety disorder Active Problems:   Depression   Syncope   Meralgia paresthetica of left side   Left leg numbness   Major depressive disorder, recurrent episode, severe   Chronic diastolic heart failure    SUBJECTIVE  Still having atypical musculoskeletal type chest wall pain. Troponins normal. Echo is normal.  CURRENT MEDS . aspirin EC  81 mg Oral Daily  . clonazePAM  0.5 mg Oral BID  . escitalopram  10 mg Oral QHS  . heparin  5,000 Units Subcutaneous 3 times per day  . levothyroxine  150 mcg Oral QAC breakfast  . metoprolol succinate  50 mg Oral Daily  . sodium chloride  3 mL Intravenous Q12H    OBJECTIVE  Filed Vitals:   07/05/14 0436 07/05/14 2056 07/06/14 0500 07/06/14 0605  BP: 120/68 130/82  131/91  Pulse: 76 67  68  Temp: 98.7 F (37.1 C) 98.5 F (36.9 C)  97.6 F (36.4 C)  TempSrc: Oral Oral  Oral  Resp: 18 19  18   Height:      Weight:   209 lb 14.1 oz (95.2 kg)   SpO2: 97% 98%  97%   No intake or output data in the 24 hours ending 07/06/14 0905 Filed Weights   07/05/14 0128 07/06/14 0500  Weight: 206 lb 9.1 oz (93.7 kg) 209 lb 14.1 oz (95.2 kg)    PHYSICAL EXAM  General: Pleasant, NAD. Neuro: Alert and oriented X 3. Moves all extremities spontaneously. Psych: Normal affect. HEENT:  Normal  Neck: Supple without bruits or JVD. Lungs:  Resp regular and unlabored, CTA. Heart: RRR no s3, s4, or murmurs. Tender to palpation left anterior chest. Abdomen: Soft, non-tender, non-distended, BS + x 4.  Extremities: No clubbing, cyanosis or edema. DP/PT/Radials 2+ and equal bilaterally.  Accessory Clinical Findings  CBC  Recent Labs  07/05/14 0609 07/06/14 0341  WBC 7.7 8.4  NEUTROABS 4.7 4.7  HGB 12.0 12.1  HCT 36.7 37.0  MCV 96.1 97.6  PLT 270 299   Basic Metabolic Panel  Recent Labs  07/05/14 0609 07/06/14 0341  NA 138 138  K  3.2* 3.8  CL 105 106  CO2 26 21  GLUCOSE 118* 100*  BUN 10 11  CREATININE 1.08 0.90  CALCIUM 8.5 8.7   Liver Function Tests  Recent Labs  07/05/14 0609 07/06/14 0341  AST 20 16  ALT 15 13  ALKPHOS 74 76  BILITOT 0.6 0.3  PROT 5.9* 5.8*  ALBUMIN 3.3* 3.1*   No results for input(s): LIPASE, AMYLASE in the last 72 hours. Cardiac Enzymes  Recent Labs  07/04/14 2358 07/05/14 0609 07/05/14 1133  TROPONINI <0.03 <0.03 <0.03   BNP Invalid input(s): POCBNP D-Dimer No results for input(s): DDIMER in the last 72 hours. Hemoglobin A1C  Recent Labs  07/04/14 2358  HGBA1C 5.8*   Fasting Lipid Panel  Recent Labs  07/04/14 2358  CHOL 167  HDL 53  LDLCALC 97  TRIG 86  CHOLHDL 3.2   Thyroid Function Tests  Recent Labs  07/04/14 2358  TSH 3.138    TELE  NSR  ECG  2Decho   Left ventricle: The cavity size was normal. Wall thickness was normal. Systolic function was normal. The estimated ejection fraction was in the range of 55% to 60%. Wall motion was normal; there were no regional wall motion abnormalities. Doppler parameters are consistent with abnormal  left ventricular relaxation (grade 1 diastolic dysfunction).  Impressions:  - Normal LV function; grade 1 diastolic dysfunction; trace MR and TR.Radiology/Studies  Dg Chest 2 View  07/04/2014   CLINICAL DATA:  Left-sided chest pain with dizziness, headache, shortness of breath beginning around 11 a.m. Symptoms worse around 1 p.m. History of hypertension.  EXAM: CHEST  2 VIEW  COMPARISON:  04/25/2014  FINDINGS: The heart size and mediastinal contours are within normal limits. Both lungs are clear. The visualized skeletal structures are unremarkable.  IMPRESSION: No active cardiopulmonary disease.   Electronically Signed   By: Nolon Nations M.D.   On: 07/04/2014 15:21   Ct Head Wo Contrast  07/04/2014   CLINICAL DATA:  Weakness with severe frontal head pain and left chest pain.  EXAM: CT  HEAD WITHOUT CONTRAST  TECHNIQUE: Contiguous axial images were obtained from the base of the skull through the vertex without intravenous contrast.  COMPARISON:  12/26/2012  FINDINGS: Skull and Sinuses:Negative for fracture or destructive process. The mastoids, middle ears, and imaged paranasal sinuses are clear.  Orbits: No acute abnormality.  Brain: No evidence of acute infarction, hemorrhage, hydrocephalus, or mass lesion/mass effect.  IMPRESSION: Normal head CT.   Electronically Signed   By: Monte Fantasia M.D.   On: 07/04/2014 22:04   Mri Brain Without Contrast  07/05/2014   CLINICAL DATA:  Syncopal episode for 3 minutes this morning associated with chest pain, dizziness, headache and shortness of breath. History of hypertension and migraines.  EXAM: MRI HEAD WITHOUT CONTRAST  TECHNIQUE: Multiplanar, multiecho pulse sequences of the brain and surrounding structures were obtained without intravenous contrast.  COMPARISON:  CT of the head July 04, 2014 at 21 33 hours  FINDINGS: The ventricles and sulci are normal for patient's age. No abnormal parenchymal signal, mass lesions, mass effect. No reduced diffusion to suggest acute ischemia. No susceptibility artifact to suggest hemorrhage.  No abnormal extra-axial fluid collections. No extra-axial masses though, contrast enhanced sequences would be more sensitive. Normal major intracranial vascular flow voids seen at the skull base.  Ocular globes and orbital contents are unremarkable though not tailored for evaluation. No abnormal sellar expansion. Trace paranasal sinus mucosal thickening with RIGHT maxillary sinus mucosal retention cyst. The mastoid air cells are well aerated. No suspicious calvarial bone marrow signal. No abnormal sellar expansion. Craniocervical junction maintained.  IMPRESSION: Normal noncontrast MRI of the brain.   Electronically Signed   By: Elon Alas   On: 07/05/2014 01:07    ASSESSMENT AND PLAN  Atypical chest  pain Essential hypertension.  Plan: okay for discharge today from cardiology standpoint. Please arrange for outpatient lexiscan myoview stress test (unable to do treadmill because of left leg problems). Signed, Darlin Coco MD

## 2014-07-06 NOTE — Discharge Instructions (Signed)
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
## 2014-07-06 NOTE — Progress Notes (Signed)
PROGRESS NOTE  April Dyer ZOX:[id number] DOB: [birth year] DOA: 07/04/2014 PCP: Benito Mccreedy, MD  HPI/Recap of past 33 hours: 44 year old female with past medical history of hypertension and hypothyroidism, admitted on 4/11 for chest pain.  Enzymes 3 negative. Patient has been   undergoing severe stress at home after being recently married and reportedly her husband has been unfaithful. She has been trying to suppress these feelings and feels quite overwhelmed and anxious and depressed. Seen by psychiatry and started on Klonopin and Lexapro.  Patient crit by cardiology with plans for outpatient stress test. Initial plan was to discharge today 4/13. Sectioning, she reportedly talked to her pastor and allegedly felt like he was not taking her side she became quite agitated. When seeing psychiatry, patient became quite upset and through a table. Discharge canceled and patient given IV Ativan.  I saw her earlier, she was anxious, but wanting to go home. Still felt lightheaded, somewhat nauseated, but otherwise okay Assessment/Plan: Principal Problem:   Generalized anxiety disorder: On Klonopin.    Syncope   Meralgia paresthetica of left side: Resolved, likely anxiety    Major depressive disorder, recurrent episode, severe: Started on Lexapro. Had extensive discussion with patient and recommending outpatient counseling as well   Chronic diastolic heart failure: Incidentally noted on echocardiogram. BNP normal. Provide education and started low-dose lisinopril.  Chest pain: Troponin 3 ruled out. Plan for outpatient Lexi scan stress test   essential hypertension: Already on beta blocker, ACE inhibitor added  Hypothyroidism: Continue Synthroid  Code Status: Full code  Family Communication: Declined for me to talk to her husband  Disposition Plan: Pending per psychiatry and agitation   Consultants:  Cardiology  Psychiatry  Procedures:  Echocardiogram done 4/12:  Grade 1 diastolic dysfunction  Antibiotics:  None   Objective: BP 131/91 mmHg  Pulse 68  Temp(Src) 97.6 F (36.4 C) (Oral)  Resp 18  Ht 5\' 8"  (1.727 m)  Wt 95.2 kg (209 lb 14.1 oz)  BMI 31.92 kg/m2  SpO2 97%  LMP 06/03/2014 No intake or output data in the 24 hours ending 07/06/14 1638 Filed Weights   07/05/14 0128 07/06/14 0500  Weight: 93.7 kg (206 lb 9.1 oz) 95.2 kg (209 lb 14.1 oz)    Exam:   General:  Alert and oriented 3, no acute distress other than slight anxiety  Cardiovascular: regular rate and rhythm, S1-S2  Respiratory: clear to auscultation bilaterally  Abdomen: soft, nontender, nondistended, positive bowel sounds  Musculoskeletal: no clubbing or cyanosis or edema   Data Reviewed: Basic Metabolic Panel:  Recent Labs Lab 07/04/14 1355 07/04/14 2358 07/05/14 0609 07/06/14 0341  NA 136  --  138 138  K 3.5  --  3.2* 3.8  CL 106  --  105 106  CO2 23  --  26 21  GLUCOSE 90  --  118* 100*  BUN 6  --  10 11  CREATININE 0.91 1.03 1.08 0.90  CALCIUM 9.0  --  8.5 8.7   Liver Function Tests:  Recent Labs Lab 07/05/14 0609 07/06/14 0341  AST 20 16  ALT 15 13  ALKPHOS 74 76  BILITOT 0.6 0.3  PROT 5.9* 5.8*  ALBUMIN 3.3* 3.1*   No results for input(s): LIPASE, AMYLASE in the last 168 hours. No results for input(s): AMMONIA in the last 168 hours. CBC:  Recent Labs Lab 07/04/14 1355 07/04/14 2358 07/05/14 0609 07/06/14 0341  WBC 9.4 10.2 7.7 8.4  NEUTROABS  --   --  4.7 4.7  HGB 12.8 12.5 12.0 12.1  HCT 38.6 38.0 36.7 37.0  MCV 94.8 95.7 96.1 97.6  PLT 281 285 270 262   Cardiac Enzymes:    Recent Labs Lab 07/04/14 2358 07/05/14 0609 07/05/14 1133  TROPONINI <0.03 <0.03 <0.03   BNP (last 3 results)  Recent Labs  07/04/14 2358 07/06/14 1115  BNP 12.2 26.5    ProBNP (last 3 results) No results for input(s): PROBNP in the last 8760 hours.  CBG:  Recent Labs Lab 07/05/14 0554 07/06/14 0525  GLUCAP 127* 99    No  results found for this or any previous visit (from the past 240 hour(s)).   Studies: Mri Brain Without Contrast  07/05/2014   CLINICAL DATA:  Syncopal episode for 3 minutes this morning associated with chest pain, dizziness, headache and shortness of breath. History of hypertension and migraines.  EXAM: MRI HEAD WITHOUT CONTRAST  TECHNIQUE: Multiplanar, multiecho pulse sequences of the brain and surrounding structures were obtained without intravenous contrast.  COMPARISON:  CT of the head July 04, 2014 at 21 33 hours  FINDINGS: The ventricles and sulci are normal for patient's age. No abnormal parenchymal signal, mass lesions, mass effect. No reduced diffusion to suggest acute ischemia. No susceptibility artifact to suggest hemorrhage.  No abnormal extra-axial fluid collections. No extra-axial masses though, contrast enhanced sequences would be more sensitive. Normal major intracranial vascular flow voids seen at the skull base.  Ocular globes and orbital contents are unremarkable though not tailored for evaluation. No abnormal sellar expansion. Trace paranasal sinus mucosal thickening with RIGHT maxillary sinus mucosal retention cyst. The mastoid air cells are well aerated. No suspicious calvarial bone marrow signal. No abnormal sellar expansion. Craniocervical junction maintained.  IMPRESSION: Normal noncontrast MRI of the brain.   Electronically Signed   By: Elon Alas   On: 07/05/2014 01:07    Scheduled Meds: . aspirin EC  81 mg Oral Daily  . clonazePAM  1 mg Oral BID  . escitalopram  20 mg Oral QHS  . heparin  5,000 Units Subcutaneous 3 times per day  . levothyroxine  150 mcg Oral QAC breakfast  . lisinopril  2.5 mg Oral Daily  . LORazepam      . metoprolol succinate  50 mg Oral Daily  . sodium chloride  3 mL Intravenous Q12H    Continuous Infusions:    Time spent: 25 minutes  [facility] Pager [phone]. If 7PM-7AM, please contact night-coverage at  [email or web address], password [facility] 07/06/2014, 4:38 PM

## 2014-07-07 DIAGNOSIS — R42 Dizziness and giddiness: Secondary | ICD-10-CM | POA: Insufficient documentation

## 2014-07-07 DIAGNOSIS — I5032 Chronic diastolic (congestive) heart failure: Secondary | ICD-10-CM | POA: Diagnosis not present

## 2014-07-07 DIAGNOSIS — F411 Generalized anxiety disorder: Secondary | ICD-10-CM | POA: Diagnosis not present

## 2014-07-07 DIAGNOSIS — F332 Major depressive disorder, recurrent severe without psychotic features: Secondary | ICD-10-CM | POA: Diagnosis not present

## 2014-07-07 DIAGNOSIS — R45851 Suicidal ideations: Secondary | ICD-10-CM | POA: Diagnosis not present

## 2014-07-07 LAB — CBC WITH DIFFERENTIAL/PLATELET
Basophils Absolute: 0 10*3/uL (ref 0.0–0.1)
Basophils Relative: 0 % (ref 0–1)
Eosinophils Absolute: 0.2 10*3/uL (ref 0.0–0.7)
Eosinophils Relative: 2 % (ref 0–5)
HEMATOCRIT: 36.8 % (ref 36.0–46.0)
HEMOGLOBIN: 12.1 g/dL (ref 12.0–15.0)
Lymphocytes Relative: 27 % (ref 12–46)
Lymphs Abs: 2.4 10*3/uL (ref 0.7–4.0)
MCH: 31.6 pg (ref 26.0–34.0)
MCHC: 32.9 g/dL (ref 30.0–36.0)
MCV: 96.1 fL (ref 78.0–100.0)
MONO ABS: 0.6 10*3/uL (ref 0.1–1.0)
Monocytes Relative: 6 % (ref 3–12)
NEUTROS ABS: 5.6 10*3/uL (ref 1.7–7.7)
Neutrophils Relative %: 65 % (ref 43–77)
Platelets: 250 10*3/uL (ref 150–400)
RBC: 3.83 MIL/uL — ABNORMAL LOW (ref 3.87–5.11)
RDW: 13.7 % (ref 11.5–15.5)
WBC: 8.8 10*3/uL (ref 4.0–10.5)

## 2014-07-07 LAB — COMPREHENSIVE METABOLIC PANEL
ALT: 14 U/L (ref 0–35)
AST: 18 U/L (ref 0–37)
Albumin: 3.2 g/dL — ABNORMAL LOW (ref 3.5–5.2)
Alkaline Phosphatase: 67 U/L (ref 39–117)
Anion gap: 10 (ref 5–15)
BUN: 11 mg/dL (ref 6–23)
CO2: 23 mmol/L (ref 19–32)
Calcium: 8.7 mg/dL (ref 8.4–10.5)
Chloride: 106 mmol/L (ref 96–112)
Creatinine, Ser: 1 mg/dL (ref 0.50–1.10)
GFR calc Af Amer: 78 mL/min — ABNORMAL LOW (ref >90)
GFR, EST NON AFRICAN AMERICAN: 67 mL/min — AB (ref >90)
GLUCOSE: 89 mg/dL (ref 70–99)
Potassium: 4 mmol/L (ref 3.5–5.1)
Sodium: 139 mmol/L (ref 135–145)
TOTAL PROTEIN: 5.6 g/dL — AB (ref 6.0–8.3)
Total Bilirubin: 0.3 mg/dL (ref 0.3–1.2)

## 2014-07-07 LAB — GLUCOSE, CAPILLARY: Glucose-Capillary: 97 mg/dL (ref 70–99)

## 2014-07-07 MED ORDER — MECLIZINE HCL 25 MG PO TABS
25.0000 mg | ORAL_TABLET | ORAL | Status: AC
  Administered 2014-07-07: 25 mg via ORAL
  Filled 2014-07-07: qty 1
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
## 2014-07-07 NOTE — Progress Notes (Signed)
Physical Therapy Treatment Patient Details Name: April Dyer MRN: [id number] DOB: [birth year] Today's Date: 07/07/2014    History of Present Illness Pt is a 44 y.o. year old female with significant past medical history of HTN, hypothyroidism, GERD presenting with syncope, chest pain, paresthesia. Patient reports anterior chest pain associated with dizziness, headache and shortness of breath since around 11 AM today. Symptoms persisted over the course of about 2 hours. Patient then reports a syncopal episode lasted approximately 3 minutes. Denies any known head trauma. Chest pain has been present for 1 day. Central in nature with radiation across anterior chest. Dizziness present while sitting. Workers found her on the floor. Patient refused EMS transport. Also complains about left anterior thigh paresthesias over the past day. Patient also reports that she is filled to outpatient stress test secondary to exercise intolerance. Chest pain 9 out of 10 at its worse.    PT Comments    Pt is not progressing towards physical therapy goals. Pt was found on the floor when PT entered room, and focus of session was getting pt up and positioned in the chair. RN present for most of session. Although pt does not report increased pain in knee, ice pack provided at end of session for comfort. In light of continued mobility deficits and decreased safety awareness, feel pt will need 24 hour assistance at d/c. If this cannot be arranged, may want to consider a higher level of care for pt safety at d/c.   Follow Up Recommendations  Outpatient PT;Supervision/Assistance - 24 hour     Equipment Recommendations  Rolling walker with 5" wheels;Other (comment) (shower chair/tub bench)    Recommendations for Other Services       Precautions / Restrictions Precautions Precautions: Fall Precaution Comments: Pt sitting on the floor upon PT arrival 07/07/14. States she fell and RN notified. Restrictions Weight  Bearing Restrictions: No    Mobility  Bed Mobility               General bed mobility comments: Pt OOB on PT arrival.   Transfers Overall transfer level: Needs assistance Equipment used: 2 person hand held assist Transfers: Sit to/from Stand Sit to Stand: Max assist;+2 physical assistance         General transfer comment: +2 max assist to assist pt to standing from long sitting on the floor.   Ambulation/Gait Ambulation/Gait assistance: Min guard Ambulation Distance (Feet): 10 Feet Assistive device: Rolling walker (2 wheeled) Gait Pattern/deviations: Step-to pattern;Decreased stride length;Trunk flexed;Narrow base of support Gait velocity: Decreased Gait velocity interpretation: Below normal speed for age/gender General Gait Details: Very close guard for safety with gait belt donned. Pt moving very slow and guarded. Frequent VC's provided for posture and general safety awareness.    Stairs            Wheelchair Mobility    Modified Rankin (Stroke Patients Only)       Balance Overall balance assessment: Needs assistance Sitting-balance support: Feet supported;No upper extremity supported Sitting balance-Leahy Scale: Fair     Standing balance support: Bilateral upper extremity supported;During functional activity Standing balance-Leahy Scale: Poor Standing balance comment: Needs UE support and close guard from staff for mobility                    Cognition Arousal/Alertness: Awake/alert Behavior During Therapy: [facility] for tasks assessed/performed Overall Cognitive Status: Within Functional Limits for tasks assessed  Exercises      General Comments        Pertinent Vitals/Pain Pain Assessment: Faces Faces Pain Scale: Hurts little more Pain Location: L knee - pt does not report increased pain from the fall Pain Descriptors / Indicators: Aching;Grimacing;Guarding Pain Intervention(s): Limited activity within  patient's tolerance;Monitored during session;Repositioned    Home Living                      Prior Function            PT Goals (current goals can now be found in the care plan section) Acute Rehab PT Goals Patient Stated Goal: Decrease her pain PT Goal Formulation: With patient Time For Goal Achievement: 07/12/14 Potential to Achieve Goals: Good Progress towards PT goals: Not progressing toward goals - comment    Frequency  Min 3X/week    PT Plan Current plan remains appropriate    Co-evaluation             End of Session Equipment Utilized During Treatment: Gait belt Activity Tolerance: Other (comment) (Mobility limited as pt just had a fall prior to PT arrival. ) Patient left: in chair;with call bell/phone within reach;with chair alarm set     Time: 2637-8588 PT Time Calculation (min) (ACUTE ONLY): 26 min  Charges:  $Gait Training: 8-22 mins $Therapeutic Activity: 8-22 mins                    G Codes:      Rolinda Roan 17-Jul-2014, 11:01 AM   Rolinda Roan, PT, DPT Acute Rehabilitation Services Pager: [phone]

## 2014-07-07 NOTE — Clinical Social Work Psych Note (Signed)
Psych CSW received notification regarding disposition assistance: inpatient psychiatric hospitalization.  Psych CSW will begin referral process.  [name], [place] [phone]  Psychiatric & Orthopedics (5N 1-8) Clinical Social Worker

## 2014-07-07 NOTE — Progress Notes (Signed)
PROGRESS NOTE  April Dyer [id number]:[id number] DOB: [birth year] DOA: 07/04/2014 PCP: Benito Mccreedy, MD  Patient is medically cleared  HPI/Recap of past 64 hours: 44 year old female with past medical history of hypertension and hypothyroidism, admitted on 4/11 for chest pain.  Enzymes 3 negative. Patient has been   undergoing severe stress at home after being recently married and reportedly her husband has been unfaithful. She has been trying to suppress these feelings and feels quite overwhelmed and anxious and depressed. Seen by psychiatry and started on Klonopin and Lexapro.  Patient cleared by cardiology with plans for outpatient stress test. Initial plan was to discharge today 4/13. Sectioning, she reportedly talked to her pastor and allegedly felt like he was not taking her side she became quite agitated. When seeing psychiatry, patient became quite upset and through a table. Discharge canceled and patient given IV Ativan.  Today, patient states she's feeling better. Still somewhat anxious. Just complains of some dizziness and vertigo-like symptoms. States that these are continuous regardless of positioning: Sitting, standing or walking. She's had a number of falls.  Assessment/Plan: Principal Problem:   Generalized anxiety disorder: On Klonopin. Psychiatry recommending inpatient psychiatry hospitalization    Syncope: Possible vertigo, but I suspect this may be more conversion disorder. No other focal neurological findings.   Meralgia paresthetica of left side: Resolved, likely anxiety    Major depressive disorder, recurrent episode, severe: Started on Lexapro. Had extensive discussion with patient and recommending outpatient counseling as well   Chronic diastolic heart failure: Incidentally noted on echocardiogram. BNP normal. Provide education and started low-dose lisinopril.  Chest pain: Troponin 3 ruled out. Plan for outpatient Lexi scan stress test   essential  hypertension: Already on beta blocker, ACE inhibitor added  Hypothyroidism: Continue Synthroid  Code Status: Full code  Family Communication: Husband present today  Disposition Plan: Inpatient psychiatry   Consultants:  Cardiology  Psychiatry  Procedures:  Echocardiogram done 5/78: Grade 1 diastolic dysfunction  Antibiotics:  None   Objective: BP 135/78 mmHg  Pulse 77  Temp(Src) 98.6 F (37 C) (Oral)  Resp 18  Ht 5\' 8"  (1.727 m)  Wt 94.847 kg (209 lb 1.6 oz)  BMI 31.80 kg/m2  SpO2 100%  LMP 06/03/2014  Intake/Output Summary (Last 24 hours) at 07/07/14 1747 Last data filed at 07/07/14 1300  Gross per 24 hour  Intake    120 ml  Output      0 ml  Net    120 ml   Filed Weights   07/05/14 0128 07/06/14 0500 07/07/14 0557  Weight: 93.7 kg (206 lb 9.1 oz) 95.2 kg (209 lb 14.1 oz) 94.847 kg (209 lb 1.6 oz)    Exam:   General:  Alert and oriented 3  Cardiovascular: regular rate and rhythm, S1-S2  Respiratory: clear to auscultation bilaterally  Abdomen: soft, nontender, nondistended, positive bowel sounds  Musculoskeletal: no clubbing or cyanosis or edema   Neurologic: No focal deficits. No nystagmus.  Data Reviewed: Basic Metabolic Panel:  Recent Labs Lab 07/04/14 1355 07/04/14 2358 07/05/14 0609 07/06/14 0341 07/07/14 0425  NA 136  --  138 138 139  K 3.5  --  3.2* 3.8 4.0  CL 106  --  105 106 106  CO2 23  --  26 21 23   GLUCOSE 90  --  118* 100* 89  BUN 6  --  10 11 11   CREATININE 0.91 1.03 1.08 0.90 1.00  CALCIUM 9.0  --  8.5 8.7 8.7   Liver Function  Tests:  Recent Labs Lab 07/05/14 0609 07/06/14 0341 07/07/14 0425  AST 20 16 18   ALT 15 13 14   ALKPHOS 74 76 67  BILITOT 0.6 0.3 0.3  PROT 5.9* 5.8* 5.6*  ALBUMIN 3.3* 3.1* 3.2*   No results for input(s): LIPASE, AMYLASE in the last 168 hours. No results for input(s): AMMONIA in the last 168 hours. CBC:  Recent Labs Lab 07/04/14 1355 07/04/14 2358 07/05/14 0609  07/06/14 0341 07/07/14 0425  WBC 9.4 10.2 7.7 8.4 8.8  NEUTROABS  --   --  4.7 4.7 5.6  HGB 12.8 12.5 12.0 12.1 12.1  HCT 38.6 38.0 36.7 37.0 36.8  MCV 94.8 95.7 96.1 97.6 96.1  PLT 281 285 270 262 250   Cardiac Enzymes:    Recent Labs Lab 07/04/14 2358 07/05/14 0609 07/05/14 1133  TROPONINI <0.03 <0.03 <0.03   BNP (last 3 results)  Recent Labs  07/04/14 2358 07/06/14 1115  BNP 12.2 26.5    ProBNP (last 3 results) No results for input(s): PROBNP in the last 8760 hours.  CBG:  Recent Labs Lab 07/05/14 0554 07/06/14 0525 07/07/14 0615  GLUCAP 127* 99 97    No results found for this or any previous visit (from the past 240 hour(s)).   Studies: No results found.  Scheduled Meds: . aspirin EC  81 mg Oral Daily  . clonazePAM  1 mg Oral BID  . escitalopram  20 mg Oral QHS  . heparin  5,000 Units Subcutaneous 3 times per day  . levothyroxine  150 mcg Oral QAC breakfast  . lisinopril  2.5 mg Oral Daily  . metoprolol succinate  50 mg Oral Daily  . sodium chloride  3 mL Intravenous Q12H    Continuous Infusions:    Time spent: 25 minutes  [facility] Pager [phone]. If 7PM-7AM, please contact night-coverage at [email or web address], password [facility] 07/07/2014, 5:47 PM

## 2014-07-08 DIAGNOSIS — R42 Dizziness and giddiness: Secondary | ICD-10-CM | POA: Diagnosis not present

## 2014-07-08 DIAGNOSIS — I5032 Chronic diastolic (congestive) heart failure: Secondary | ICD-10-CM | POA: Diagnosis not present

## 2014-07-08 DIAGNOSIS — R079 Chest pain, unspecified: Secondary | ICD-10-CM | POA: Diagnosis not present

## 2014-07-08 DIAGNOSIS — F411 Generalized anxiety disorder: Secondary | ICD-10-CM | POA: Diagnosis not present

## 2014-07-08 DIAGNOSIS — F332 Major depressive disorder, recurrent severe without psychotic features: Secondary | ICD-10-CM | POA: Diagnosis not present

## 2014-07-08 DIAGNOSIS — F329 Major depressive disorder, single episode, unspecified: Secondary | ICD-10-CM | POA: Diagnosis not present

## 2014-07-08 DIAGNOSIS — R45851 Suicidal ideations: Secondary | ICD-10-CM | POA: Diagnosis not present

## 2014-07-08 LAB — COMPREHENSIVE METABOLIC PANEL
ALBUMIN: 3.2 g/dL — AB (ref 3.5–5.2)
ALT: 14 U/L (ref 0–35)
AST: 20 U/L (ref 0–37)
Alkaline Phosphatase: 77 U/L (ref 39–117)
Anion gap: 9 (ref 5–15)
BUN: 11 mg/dL (ref 6–23)
CALCIUM: 8.9 mg/dL (ref 8.4–10.5)
CO2: 24 mmol/L (ref 19–32)
CREATININE: 1.1 mg/dL (ref 0.50–1.10)
Chloride: 107 mmol/L (ref 96–112)
GFR calc Af Amer: 69 mL/min — ABNORMAL LOW (ref >90)
GFR calc non Af Amer: 60 mL/min — ABNORMAL LOW (ref >90)
Glucose, Bld: 79 mg/dL (ref 70–99)
Potassium: 4.1 mmol/L (ref 3.5–5.1)
Sodium: 140 mmol/L (ref 135–145)
TOTAL PROTEIN: 5.8 g/dL — AB (ref 6.0–8.3)
Total Bilirubin: 0.3 mg/dL (ref 0.3–1.2)

## 2014-07-08 LAB — CBC WITH DIFFERENTIAL/PLATELET
Basophils Absolute: 0 10*3/uL (ref 0.0–0.1)
Basophils Relative: 0 % (ref 0–1)
Eosinophils Absolute: 0.2 10*3/uL (ref 0.0–0.7)
Eosinophils Relative: 2 % (ref 0–5)
HCT: 36.7 % (ref 36.0–46.0)
Hemoglobin: 11.9 g/dL — ABNORMAL LOW (ref 12.0–15.0)
Lymphocytes Relative: 35 % (ref 12–46)
Lymphs Abs: 2.9 10*3/uL (ref 0.7–4.0)
MCH: 31.5 pg (ref 26.0–34.0)
MCHC: 32.4 g/dL (ref 30.0–36.0)
MCV: 97.1 fL (ref 78.0–100.0)
Monocytes Absolute: 0.5 10*3/uL (ref 0.1–1.0)
Monocytes Relative: 6 % (ref 3–12)
Neutro Abs: 4.8 10*3/uL (ref 1.7–7.7)
Neutrophils Relative %: 57 % (ref 43–77)
Platelets: 254 10*3/uL (ref 150–400)
RBC: 3.78 MIL/uL — ABNORMAL LOW (ref 3.87–5.11)
RDW: 13.9 % (ref 11.5–15.5)
WBC: 8.4 10*3/uL (ref 4.0–10.5)

## 2014-07-08 LAB — GLUCOSE, CAPILLARY: Glucose-Capillary: 76 mg/dL (ref 70–99)

## 2014-07-08 MED ORDER — CLONAZEPAM 0.5 MG PO TABS
0.5000 mg | ORAL_TABLET | Freq: Two times a day (BID) | ORAL | Status: DC
  Administered 2014-07-08 – 2014-07-10 (×4): 0.5 mg via ORAL
  Filled 2014-07-08 (×4): qty 1

## 2014-07-08 MED ORDER — PROMETHAZINE HCL 25 MG/ML IJ SOLN
25.0000 mg | Freq: Four times a day (QID) | INTRAMUSCULAR | Status: DC | PRN
  Administered 2014-07-08 – 2014-07-09 (×2): 25 mg via INTRAVENOUS
  Filled 2014-07-08 (×2): qty 1

## 2014-07-08 MED ORDER — MECLIZINE HCL 25 MG PO TABS
50.0000 mg | ORAL_TABLET | Freq: Three times a day (TID) | ORAL | Status: DC | PRN
  Administered 2014-07-08: 50 mg via ORAL
  Filled 2014-07-08 (×2): qty 2

## 2014-07-08 MED ORDER — MECLIZINE HCL 50 MG PO TABS: 50.0000 mg | ORAL_TABLET | Freq: Three times a day (TID) | ORAL | Status: DC | PRN

## 2014-07-08 MED ORDER — ESCITALOPRAM OXALATE 20 MG PO TABS: 20.0000 mg | ORAL_TABLET | Freq: Every day | ORAL | Status: DC

## 2014-07-08 MED ORDER — CLONAZEPAM 0.5 MG PO TABS
0.5000 mg | ORAL_TABLET | Freq: Once | ORAL | Status: AC
  Administered 2014-07-08: 0.5 mg via ORAL
  Filled 2014-07-08: qty 1

## 2014-07-08 MED ORDER — LISINOPRIL 2.5 MG PO TABS: 2.5000 mg | ORAL_TABLET | Freq: Every day | ORAL | Status: DC

## 2014-07-08 NOTE — Discharge Summary (Signed)
Discharge Summary  April Dyer [id number]:[id number] DOB: 07/27/1970  PCP: Benito Mccreedy, MD  Admit date: 07/04/2014 Anticipated Discharge date: Pending  Time spent: 25 minutes  Recommendations for Outpatient Follow-up:  1. New medications lisinopril 2.5 mg by mouth daily 2. New medication: Lexapro 20 mg by mouth daily 3. New medication: Klonopin 0.5 mg by mouth twice a day  Discharge Diagnoses:  Active Hospital Problems   Diagnosis Date Noted  . Generalized anxiety disorder 07/05/2014  . Vertigo   . Chronic diastolic heart failure 93/71/6967  . Chest pain, rule out acute myocardial infarction   . Meralgia paresthetica of left side 07/05/2014  . Left leg numbness 07/05/2014  . Major depressive disorder, recurrent episode, severe 07/05/2014  . Syncope 07/04/2014  . Depression     Resolved Hospital Problems   Diagnosis Date Noted Date Resolved  No resolved problems to display.    Discharge Condition: Improved, being discharged to inpatient psychiatric facility  Diet recommendation: Heart healthy  Filed Weights   07/07/14 0557 07/08/14 0500 07/08/14 0614  Weight: 94.847 kg (209 lb 1.6 oz) 95.8 kg (211 lb 3.2 oz) 95.8 kg (211 lb 3.2 oz)    History of present illness:  44 year old female with past medical history of hypertension and hypothyroidism, admitted on 4/11 for chest pain.  Hospital Course:  Essential hypertension: Patient already on beta blocker. See below. Lisinopril added.    Left leg numbness: Patient underwent this on admission, this quickly resolved. Felt more likely to anxiety.    Major depressive disorder, recurrent episode, severe/generalized anxiety disorder: Patient had been reporting severe stress at home and reportedly had issues with husband's infidelity. Patient feels very overwhelmed and anxious and depressed trying to suppress these feelings. She requested to be seen by psychiatry and she was started on Klonopin and Lexapro. After being  cleared by cardiology on 4/13, patient reportedly was talking to some visitors and became severely anxious and further depressed. Psychiatrist saw patient in follow-up and recommended inpatient psychiatry, which patient is amenable to. Klonopin and Lexapro both increased. Patient started reporting episodes of significant dizziness with increasing Klonopin. See below.    Chronic diastolic heart failure: Incidentally noted on echocardiogram. Patient already on beta blocker. BNP normal so not in active heart failure. Lisinopril added.    Chest pain, rule out acute myocardial infarction: Enzymes 3 negative. Echocardiogram only noted grade 1 diastolic dysfunction. See my cardiology who felt patient chest pain was noncardiac in nature. For completeness they will follow up for outpatient Lexi scan stress test.    Vertigo/syncope: Patient started developing the symptoms after hospital day one. No focal neurological findings. Did not respond very much to [place]. Suspected that this could be in response to being started on Klonopin and then dose being increased 1 mg by mouth twice a day. Decreased dose back to 0.5 by mouth twice a day and this seems to help.   Procedures:  Echocardiogram done 8/93: Grade 1 diastolic dysfunction  Consultations:  Cardiology  Psychiatry  Discharge Exam: BP 129/83 mmHg  Pulse 60  Temp(Src) 98.6 F (37 C) (Oral)  Resp 18  Ht 5\' 8"  (1.727 m)  Wt 95.8 kg (211 lb 3.2 oz)  BMI 32.12 kg/m2  SpO2 98%  LMP 06/03/2014  General: Alert and oriented 3 Cardiovascular: Regular rate and rhythm, S1-S2 Respiratory: Clear to auscultation bilaterally  Discharge Instructions You were cared for by a hospitalist during your hospital stay. If you have any questions about your discharge medications or the care you  received while you were in the hospital after you are discharged, you can call the unit and asked to speak with the hospitalist on call if the hospitalist that took care  of you is not available. Once you are discharged, your primary care physician will handle any further medical issues. Please note that NO REFILLS for any discharge medications will be authorized once you are discharged, as it is imperative that you return to your primary care physician (or establish a relationship with a primary care physician if you do not have one) for your aftercare needs so that they can reassess your need for medications and monitor your lab values.  Discharge Instructions    Diet - low sodium heart healthy    Complete by:  As directed      Diet - low sodium heart healthy    Complete by:  As directed      Increase activity slowly    Complete by:  As directed      Increase activity slowly    Complete by:  As directed             Medication List    STOP taking these medications        doxycycline 100 MG capsule  Commonly known as:  VIBRAMYCIN     oxyCODONE-acetaminophen 7.5-325 MG per tablet  Commonly known as:  PERCOCET      TAKE these medications        clonazePAM 0.5 MG tablet  Commonly known as:  KLONOPIN  Take 1 tablet (0.5 mg total) by mouth 2 (two) times daily.     escitalopram 20 MG tablet  Commonly known as:  LEXAPRO  Take 1 tablet (20 mg total) by mouth at bedtime.     levothyroxine 150 MCG tablet  Commonly known as:  SYNTHROID, LEVOTHROID  Take 150 mcg by mouth daily.     lisinopril 2.5 MG tablet  Commonly known as:  PRINIVIL,ZESTRIL  Take 1 tablet (2.5 mg total) by mouth daily.     meclizine 50 MG tablet  Commonly known as:  ANTIVERT  Take 1 tablet (50 mg total) by mouth 3 (three) times daily as needed for dizziness.     metoprolol succinate 50 MG 24 hr tablet  Commonly known as:  TOPROL-XL  Take 50 mg by mouth daily.       No Known Allergies     Follow-up Information    Follow up with Carloyn Jaeger, MD.   Specialty:  Psychiatry   Contact information:   [place] [address] [phone]       Follow  up with Dennard Nip, NP.   Specialty:  Psychiatry   Contact information:   [facility] [address] [place] [address] [phone]       Follow up with Stress test.   Why:  The office scheduler will call you with the appt date and time.    Contact information:   [place] [address] [phone]       The results of significant diagnostics from this hospitalization (including imaging, microbiology, ancillary and laboratory) are listed below for reference.    Significant Diagnostic Studies: Dg Chest 2 View  07/04/2014   CLINICAL DATA:  Left-sided chest pain with dizziness, headache, shortness of breath beginning around 11 a.m. Symptoms worse around 1 p.m. History of hypertension.  EXAM: CHEST  2 VIEW  COMPARISON:  04/25/2014  FINDINGS: The heart size and mediastinal  contours are within normal limits. Both lungs are clear. The visualized skeletal structures are unremarkable.  IMPRESSION: No active cardiopulmonary disease.   Electronically Signed   By: Nolon Nations M.D.   On: 07/04/2014 15:21   Ct Head Wo Contrast  07/04/2014   CLINICAL DATA:  Weakness with severe frontal head pain and left chest pain.  EXAM: CT HEAD WITHOUT CONTRAST  TECHNIQUE: Contiguous axial images were obtained from the base of the skull through the vertex without intravenous contrast.  COMPARISON:  12/26/2012  FINDINGS: Skull and Sinuses:Negative for fracture or destructive process. The mastoids, middle ears, and imaged paranasal sinuses are clear.  Orbits: No acute abnormality.  Brain: No evidence of acute infarction, hemorrhage, hydrocephalus, or mass lesion/mass effect.  IMPRESSION: Normal head CT.   Electronically Signed   By: Monte Fantasia M.D.   On: 07/04/2014 22:04   Mri Brain Without Contrast  07/05/2014   CLINICAL DATA:  Syncopal episode for 3 minutes this morning associated with chest pain, dizziness, headache and shortness of breath. History of  hypertension and migraines.  EXAM: MRI HEAD WITHOUT CONTRAST  TECHNIQUE: Multiplanar, multiecho pulse sequences of the brain and surrounding structures were obtained without intravenous contrast.  COMPARISON:  CT of the head July 04, 2014 at 21 33 hours  FINDINGS: The ventricles and sulci are normal for patient's age. No abnormal parenchymal signal, mass lesions, mass effect. No reduced diffusion to suggest acute ischemia. No susceptibility artifact to suggest hemorrhage.  No abnormal extra-axial fluid collections. No extra-axial masses though, contrast enhanced sequences would be more sensitive. Normal major intracranial vascular flow voids seen at the skull base.  Ocular globes and orbital contents are unremarkable though not tailored for evaluation. No abnormal sellar expansion. Trace paranasal sinus mucosal thickening with RIGHT maxillary sinus mucosal retention cyst. The mastoid air cells are well aerated. No suspicious calvarial bone marrow signal. No abnormal sellar expansion. Craniocervical junction maintained.  IMPRESSION: Normal noncontrast MRI of the brain.   Electronically Signed   By: Elon Alas   On: 07/05/2014 01:07    Microbiology: No results found for this or any previous visit (from the past 240 hour(s)).   Labs: Basic Metabolic Panel:  Recent Labs Lab 07/04/14 1355 07/04/14 2358 07/05/14 0609 07/06/14 0341 07/07/14 0425 07/08/14 0503  NA 136  --  138 138 139 140  K 3.5  --  3.2* 3.8 4.0 4.1  CL 106  --  105 106 106 107  CO2 23  --  26 21 23 24   GLUCOSE 90  --  118* 100* 89 79  BUN 6  --  10 11 11 11   CREATININE 0.91 1.03 1.08 0.90 1.00 1.10  CALCIUM 9.0  --  8.5 8.7 8.7 8.9   Liver Function Tests:  Recent Labs Lab 07/05/14 0609 07/06/14 0341 07/07/14 0425 07/08/14 0503  AST 20 16 18 20   ALT 15 13 14 14   ALKPHOS 74 76 67 77  BILITOT 0.6 0.3 0.3 0.3  PROT 5.9* 5.8* 5.6* 5.8*  ALBUMIN 3.3* 3.1* 3.2* 3.2*   No results for input(s): LIPASE, AMYLASE in  the last 168 hours. No results for input(s): AMMONIA in the last 168 hours. CBC:  Recent Labs Lab 07/04/14 2358 07/05/14 0609 07/06/14 0341 07/07/14 0425 07/08/14 0503  WBC 10.2 7.7 8.4 8.8 8.4  NEUTROABS  --  4.7 4.7 5.6 4.8  HGB 12.5 12.0 12.1 12.1 11.9*  HCT 38.0 36.7 37.0 36.8 36.7  MCV 95.7 96.1 97.6 96.1 97.1  PLT 285 270 262 250 254   Cardiac Enzymes:  Recent Labs Lab 07/04/14 2358 07/05/14 0609 07/05/14 1133  TROPONINI <0.03 <0.03 <0.03   BNP: BNP (last 3 results)  Recent Labs  07/04/14 2358 07/06/14 1115  BNP 12.2 26.5    ProBNP (last 3 results) No results for input(s): PROBNP in the last 8760 hours.  CBG:  Recent Labs Lab 07/05/14 0554 07/06/14 0525 07/07/14 0615 07/08/14 0607  GLUCAP 127* 99 97 76       Signed:  KRISHNAN,SENDIL K  [facility] 07/08/2014, 2:40 PM

## 2014-07-08 NOTE — Progress Notes (Signed)
UR completed 

## 2014-07-08 NOTE — Progress Notes (Signed)
Pt ambulating in hallway without walker with nursg assistance, approx 37ft pt had some nausea and vomitted. Pt denies dizzines and pain at this time. Gave zofran will ambulate later in the day.

## 2014-07-08 NOTE — Progress Notes (Addendum)
Physical Therapy Treatment Patient Details Name: JOSEPHENE MARRONE MRN: [id number] DOB: [birth year] Today's Date: 07/08/2014    History of Present Illness Pt is a 44 y.o. year old female with significant past medical history of HTN, hypothyroidism, GERD presenting with syncope, chest pain, paresthesia. Patient reports anterior chest pain associated with dizziness, headache and shortness of breath since around 11 AM today. Symptoms persisted over the course of about 2 hours. Patient then reports a syncopal episode lasted approximately 3 minutes. Denies any known head trauma. Chest pain has been present for 1 day. Central in nature with radiation across anterior chest. Dizziness present while sitting. Workers found her on the floor. Patient refused EMS transport. Also complains about left anterior thigh paresthesias over the past day. Patient also reports that she is filled to outpatient stress test secondary to exercise intolerance. Chest pain 9 out of 10 at its worse.    PT Comments    Pt progressing slowly towards physical therapy goals. Pt had wrapped her L knee in an ACE bandage prior to PT arrival. Pt was educated in proper wrapping technique to avoid constricting blood flow, as it appears that pt will be self-wrapping PRN. Pt was limited this session by complaints of dizziness, including exacerbation from light, and seeing spots/colors. Pt was able to complete come gait training with the RW before returning to the room, and tolerated minimal walking in room with no AD. Per pt, she needs to be able to walk without an AD to be accepted into [facility] states that she is checking into whether or not pt will be allowed to use an AD there. As of this session, pt will need UE support for safety and balance. Pt does fair with short distances, but pt still reaches out for furniture to hold to. Pt needs hands-on guarding if walking without an AD. Will continue to follow and progress as able  per POC.   Follow Up Recommendations  Home Health PT;Supervision/Assistance - 24 hour     Equipment Recommendations  Rolling walker with 5" wheels;Other (comment) (shower chair/tub bench)    Recommendations for Other Services       Precautions / Restrictions Precautions Precautions: Fall Precaution Comments: Pt sitting on the floor upon PT arrival 07/07/14. States she fell and RN notified. Restrictions Weight Bearing Restrictions: No    Mobility  Bed Mobility Overal bed mobility: Needs Assistance Bed Mobility: Supine to Sit     Supine to sit: HOB elevated;Min guard     General bed mobility comments: No assist required. Use of bed rails for support.   Transfers Overall transfer level: Needs assistance Equipment used: Rolling walker (2 wheeled) Transfers: Sit to/from Stand Sit to Stand: Min guard         General transfer comment: Close guard for safety. Pt was cued for hand placement on seated surface for safety. No unsteadiness noted.   Ambulation/Gait Ambulation/Gait assistance: Min guard Ambulation Distance (Feet): 50 Feet (40, seated rest, 10) Assistive device: Rolling walker (2 wheeled) Gait Pattern/deviations: Step-through pattern;Decreased stride length;Trunk flexed Gait velocity: Decreased Gait velocity interpretation: Below normal speed for age/gender General Gait Details: Very close guard for safety with gait belt donned. Pt moving very slow and guarded. Frequent VC's provided for posture and general safety awareness. Pt states that she is very dizzy during gait training, with pt seeing spots and "yellow all over the floor". Pt reports that the bright light of the hallway increases her symptoms.   Stairs  Wheelchair Mobility    Modified Rankin (Stroke Patients Only)       Balance Overall balance assessment: Needs assistance Sitting-balance support: Feet supported;No upper extremity supported Sitting balance-Leahy Scale: Fair      Standing balance support: Bilateral upper extremity supported Standing balance-Leahy Scale: Poor                      Cognition Arousal/Alertness: Awake/alert Behavior During Therapy: WFL for tasks assessed/performed Overall Cognitive Status: Within Functional Limits for tasks assessed                      Exercises      General Comments General comments (skin integrity, edema, etc.): BP checked after gait training and was 127/76 in sitting.       Pertinent Vitals/Pain Pain Assessment: Faces Faces Pain Scale: Hurts a little bit Pain Location: L knee Pain Descriptors / Indicators: Grimacing;Guarding Pain Intervention(s): Limited activity within patient's tolerance    Home Living                      Prior Function            PT Goals (current goals can now be found in the care plan section) Acute Rehab PT Goals Patient Stated Goal: Decrease her pain PT Goal Formulation: With patient Time For Goal Achievement: 07/12/14 Potential to Achieve Goals: Good    Frequency  Min 3X/week    PT Plan Current plan remains appropriate    Co-evaluation             End of Session Equipment Utilized During Treatment: Gait belt Activity Tolerance: Medical complications limited therapy: dizziness with mobility.  Patient left: in chair;with call bell/phone within reach;with chair alarm set     Time: 6734-1937 PT Time Calculation (min) (ACUTE ONLY): 28 min  Charges:  $Gait Training: 8-22 mins $Therapeutic Activity: 8-22 mins                    G Codes:      Rolinda Roan 08-02-2014, 11:32 AM  Rolinda Roan, PT, DPT Acute Rehabilitation Services Pager: [phone]

## 2014-07-08 NOTE — Clinical Social Work Psych Note (Addendum)
2:09pm- Psych CSW sent referrals to the following facilities: [name]- at [facility]- at [facility]- at [facility]- at [facility]- denied  [name]- denied [name]- referral sent [name]- referral sent [name]- referral sent   Psych CSW attempted to complete assessment with patient.  Patient currently attempting to work with Physical Therapy.  Patient currently sitting at bedside with c/o dizziness and nausea.   Psych CSW spoke with [facility] at [facility] who reports no female beds available today.  Psych CSW will continue extended search for placement.  [name], Spray [phone]  Psychiatric & Orthopedics (5N 1-8) Clinical Social Worker
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
## 2014-07-08 NOTE — Progress Notes (Signed)
PT AMBULATED IN HALLWAY APPROX.100FT WITH HUSBAND. PT BECAME NAUSEATED WITH AMBULATION. GAVE PT ZOFRAN IV . MONITORING WILL CONTINUE.

## 2014-07-09 DIAGNOSIS — F411 Generalized anxiety disorder: Secondary | ICD-10-CM | POA: Diagnosis not present

## 2014-07-09 DIAGNOSIS — R45851 Suicidal ideations: Secondary | ICD-10-CM | POA: Diagnosis not present

## 2014-07-09 LAB — GLUCOSE, CAPILLARY: Glucose-Capillary: 82 mg/dL (ref 70–99)

## 2014-07-09 MED ORDER — METOPROLOL SUCCINATE ER 25 MG PO TB24
37.5000 mg | ORAL_TABLET | Freq: Every day | ORAL | Status: DC
  Administered 2014-07-10: 37.5 mg via ORAL
  Filled 2014-07-09: qty 1

## 2014-07-09 NOTE — Progress Notes (Signed)
Patient ambulated 500 feet independently in the hallway on RA. Patient did experience dizziness and nausea, but reports much better than yesterday.

## 2014-07-09 NOTE — Progress Notes (Signed)
CSW assisted with faxing referrals off for the following inpatient psych facilities:  Facilities that had available beds:  [facility] per [facility] per [facility] per [facility] per [facility]   Facilities that were reported as at capacity:  [name] per [facility] per [facility] per [facility] per [facility] per [facility]  CSW also spoke to the [facility] at [facility], their is a concern about her mobility and independence.  The [facility] will continue to monitor for admission.  [facility] Ettore Trebilcock Richardo Priest ED CSW [phone]
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
## 2014-07-09 NOTE — Progress Notes (Signed)
PROGRESS NOTE  April Dyer IDH:[id number] DOB: 06-03-1970 DOA: 07/04/2014 PCP: Benito Mccreedy, MD  Patient is medically cleared  HPI/Recap of past 50 hours: 44 year old female with past medical history of hypertension and hypothyroidism, admitted on 4/11 for chest pain.  Enzymes 3 negative. Patient has been   undergoing severe stress at home after being recently married and reportedly her husband has been unfaithful. She has been trying to suppress these feelings and feels quite overwhelmed and anxious and depressed. Seen by psychiatry and started on Klonopin and Lexapro.  Patient cleared by cardiology with plans for outpatient stress test. Initial plan was to discharge today 4/13. Sectioning, she reportedly talked to her pastor and allegedly felt like he was not taking her side she became quite agitated. When seeing psychiatry, patient became quite upset and through a table. Discharge canceled and patient given IV Ativan. Psychiatry recommending inpatient facility transfer. Patient started having episodes of lightheadedness and near syncope and with adjustments in Klonopin, symptoms have improved  Patient feeling okay, and going the hall with some dizziness although much improved  Assessment/Plan: Principal Problem:   Generalized anxiety disorder: On Klonopin. Psychiatry recommending inpatient psychiatry hospitalization    Syncope/dizziness felt to be secondary to Klonopin. Improved by decreasing dose.    Meralgia paresthetica of left side: Resolved, likely anxiety    Major depressive disorder, recurrent episode, severe: Started on Lexapro. Had extensive discussion with patient and recommending inpatient treatment    Chronic diastolic heart failure: Incidentally noted on echocardiogram. BNP normal. Provide education and started low-dose lisinopril.  Chest pain: Troponin 3 ruled out. Plan for outpatient Lexi scan stress test   essential hypertension: Already on beta  blocker, ACE inhibitor added  Hypothyroidism: Continue Synthroid  Code Status: Full code  Family Communication: Husband present today  Disposition Plan: Inpatient psychiatry   Consultants:  Cardiology  Psychiatry  Procedures:  Echocardiogram done 9/02: Grade 1 diastolic dysfunction  Antibiotics:  None   Objective: BP 118/70 mmHg  Pulse 72  Temp(Src) 98.4 F (36.9 C) (Oral)  Resp 18  Ht 5\' 8"  (1.727 m)  Wt 97.3 kg (214 lb 8.1 oz)  BMI 32.62 kg/m2  SpO2 98%  LMP 06/03/2014  Intake/Output Summary (Last 24 hours) at 07/09/14 1349 Last data filed at 07/09/14 0952  Gross per 24 hour  Intake    480 ml  Output      0 ml  Net    480 ml   Filed Weights   07/08/14 0500 07/08/14 0614 07/09/14 0650  Weight: 95.8 kg (211 lb 3.2 oz) 95.8 kg (211 lb 3.2 oz) 97.3 kg (214 lb 8.1 oz)    Exam: No change  General:  Alert and oriented 3  Cardiovascular: regular rate and rhythm, S1-S2  Respiratory: clear to auscultation bilaterally  Abdomen: soft, nontender, nondistended, positive bowel sounds  Musculoskeletal: no clubbing or cyanosis or edema     Data Reviewed: Basic Metabolic Panel:  Recent Labs Lab 07/04/14 1355 07/04/14 2358 07/05/14 0609 07/06/14 0341 07/07/14 0425 07/08/14 0503  NA 136  --  138 138 139 140  K 3.5  --  3.2* 3.8 4.0 4.1  CL 106  --  105 106 106 107  CO2 23  --  26 21 23 24   GLUCOSE 90  --  118* 100* 89 79  BUN 6  --  10 11 11 11   CREATININE 0.91 1.03 1.08 0.90 1.00 1.10  CALCIUM 9.0  --  8.5 8.7 8.7 8.9   Liver Function  Tests:  Recent Labs Lab 07/05/14 0609 07/06/14 0341 07/07/14 0425 07/08/14 0503  AST 20 16 18 20   ALT 15 13 14 14   ALKPHOS 74 76 67 77  BILITOT 0.6 0.3 0.3 0.3  PROT 5.9* 5.8* 5.6* 5.8*  ALBUMIN 3.3* 3.1* 3.2* 3.2*   No results for input(s): LIPASE, AMYLASE in the last 168 hours. No results for input(s): AMMONIA in the last 168 hours. CBC:  Recent Labs Lab 07/04/14 2358 07/05/14 0609  07/06/14 0341 07/07/14 0425 07/08/14 0503  WBC 10.2 7.7 8.4 8.8 8.4  NEUTROABS  --  4.7 4.7 5.6 4.8  HGB 12.5 12.0 12.1 12.1 11.9*  HCT 38.0 36.7 37.0 36.8 36.7  MCV 95.7 96.1 97.6 96.1 97.1  PLT 285 270 262 250 254   Cardiac Enzymes:    Recent Labs Lab 07/04/14 2358 07/05/14 0609 07/05/14 1133  TROPONINI <0.03 <0.03 <0.03   BNP (last 3 results)  Recent Labs  07/04/14 2358 07/06/14 1115  BNP 12.2 26.5    ProBNP (last 3 results) No results for input(s): PROBNP in the last 8760 hours.  CBG:  Recent Labs Lab 07/05/14 0554 07/06/14 0525 07/07/14 0615 07/08/14 0607 07/09/14 0650  GLUCAP 127* 99 97 76 82    No results found for this or any previous visit (from the past 240 hour(s)).   Studies: No results found.  Scheduled Meds: . aspirin EC  81 mg Oral Daily  . clonazePAM  0.5 mg Oral BID  . escitalopram  20 mg Oral QHS  . heparin  5,000 Units Subcutaneous 3 times per day  . levothyroxine  150 mcg Oral QAC breakfast  . lisinopril  2.5 mg Oral Daily  . [START ON 07/10/2014] metoprolol succinate  37.5 mg Oral Daily  . sodium chloride  3 mL Intravenous Q12H    Continuous Infusions:    Time spent: 10 minutes  [place] Pager [phone]. If 7PM-7AM, please contact night-coverage at [email or web address], password [facility] 07/09/2014, 1:49 PM

## 2014-07-09 NOTE — Progress Notes (Signed)
UR completed 

## 2014-07-09 NOTE — Significant Event (Signed)
Patient ambulating in halls independently. Noted patient stooped down and was vomiting in the hallways. RN assessed patient. Patient appeared weak, stated she was not nauseated at all. Stated has a headache that is unchanged from previously. No other complaints from patient.   Patient returned to room. VS taken as following.   BP 108/53 (65), pulse 80, O2 sat 98% on room air. Patient given zofran. Will continue to monitor. Dawnelle Warman, Therapist, sports.

## 2014-07-10 DIAGNOSIS — F329 Major depressive disorder, single episode, unspecified: Secondary | ICD-10-CM

## 2014-07-10 DIAGNOSIS — R079 Chest pain, unspecified: Secondary | ICD-10-CM | POA: Diagnosis not present

## 2014-07-10 DIAGNOSIS — I5032 Chronic diastolic (congestive) heart failure: Secondary | ICD-10-CM | POA: Diagnosis not present

## 2014-07-10 DIAGNOSIS — F411 Generalized anxiety disorder: Secondary | ICD-10-CM | POA: Diagnosis not present

## 2014-07-10 LAB — GLUCOSE, CAPILLARY: GLUCOSE-CAPILLARY: 81 mg/dL (ref 70–99)

## 2014-07-10 MED ORDER — METOPROLOL SUCCINATE ER 25 MG PO TB24: 37.5000 mg | ORAL_TABLET | Freq: Every day | ORAL | Status: DC

## 2014-07-10 NOTE — Progress Notes (Signed)
CSW contacted [name] to confirm that patient has been accepted to their facility.  They reccommended she bring three days of cold weather clothes to the facility.  CSW will confirm with patient she is willing to go to facility and that she understands the distance, as she is a voluntary admission.  CSW will follow up with nursing staff to transport patient by [facility] to [name].  Welford Roche [facility] ED CSW [phone]

## 2014-07-10 NOTE — Progress Notes (Signed)
Patient ambulated independently in the hallways 500 + ft. No nausea or dizziness. Patient feeling much better today

## 2014-07-10 NOTE — Discharge Summary (Signed)
Discharge Summary  April Dyer XNT:[id number] DOB: 10-06-1970  PCP: Benito Mccreedy, MD  Admit date: 07/04/2014 Anticipated Discharge date: 07/10/2014   Time spent: 25 minutes  Recommendations for Outpatient Follow-up:  1. New medications lisinopril 2.5 mg by mouth daily 2. New medication: Lexapro 20 mg by mouth daily 3. New medication: Klonopin 0.5 mg by mouth twice a day 4. Medication change: Metoprolol decreased from 50mg  daily to 37.5mg  daily  Discharge Diagnoses:  Active Hospital Problems   Diagnosis Date Noted  . Generalized anxiety disorder 07/05/2014  . Vertigo   . Chronic diastolic heart failure 96/75/9163  . Chest pain, rule out acute myocardial infarction   . Meralgia paresthetica of left side 07/05/2014  . Left leg numbness 07/05/2014  . Major depressive disorder, recurrent episode, severe 07/05/2014  . Syncope 07/04/2014  . Depression     Resolved Hospital Problems   Diagnosis Date Noted Date Resolved  No resolved problems to display.    Discharge Condition: Improved, being discharged to [name] inpatient [facility] facility  Diet recommendation: Heart healthy  Filed Weights   07/08/14 0614 07/09/14 0650 07/10/14 0403  Weight: 95.8 kg (211 lb 3.2 oz) 97.3 kg (214 lb 8.1 oz) 98.7 kg (217 lb 9.5 oz)    History of present illness:  44 year old female with past medical history of hypertension and hypothyroidism, admitted on 4/11 for chest pain.  Hospital Course:  Essential hypertension: Patient already on beta blocker, titrated down to help with dizziness. See below. Lisinopril added.    Left leg numbness: Patient underwent this on admission, this quickly resolved. Felt more likely to anxiety.    Major depressive disorder, recurrent episode, severe/generalized anxiety disorder: Patient had been reporting severe stress at home and reportedly had issues with husband's infidelity. Patient feels very overwhelmed and anxious and depressed  trying to suppress these feelings. She requested to be seen by psychiatry and she was started on Klonopin and Lexapro. After being cleared by cardiology on 4/13, patient reportedly was talking to some visitors and became severely anxious and further depressed. Psychiatrist saw patient in follow-up and recommended inpatient psychiatry, which patient is amenable to. Klonopin and Lexapro both increased. Patient started reporting episodes of significant dizziness with increasing Klonopin. See below.  Pt accepted at [facility] on 4/17.    Chronic diastolic heart failure: Incidentally noted on echocardiogram. Patient already on beta blocker. BNP normal so not in active heart failure. Lisinopril added.    Chest pain, rule out acute myocardial infarction: Enzymes 3 negative. Echocardiogram only noted grade 1 diastolic dysfunction. See my cardiology who felt patient chest pain was noncardiac in nature. For completeness they will follow up for outpatient Lexi scan stress test.    Vertigo/syncope: Patient started developing the symptoms after hospital day one. No focal neurological findings. Did not respond very much to [place]. Suspected that this could be in response to being started on Klonopin and then dose being increased 1 mg by mouth twice a day. Decreased dose back to 0.5 by mouth twice a day and this seems to help.    Procedures:  Echocardiogram done 8/46: Grade 1 diastolic dysfunction  Consultations:  Cardiology  Psychiatry  Discharge Exam: BP 115/72 mmHg  Pulse 65  Temp(Src) 97.9 F (36.6 C) (Oral)  Resp 18  Ht 5\' 8"  (1.727 m)  Wt 98.7 kg (217 lb 9.5 oz)  BMI 33.09 kg/m2  SpO2 97%  LMP 06/03/2014  General: Alert and oriented 3 Cardiovascular: Regular rate and rhythm, S1-S2 Respiratory: Clear  to auscultation bilaterally  Discharge Instructions You were cared for by a hospitalist during your hospital stay. If you have any questions about your  discharge medications or the care you received while you were in the hospital after you are discharged, you can call the unit and asked to speak with the hospitalist on call if the hospitalist that took care of you is not available. Once you are discharged, your primary care physician will handle any further medical issues. Please note that NO REFILLS for any discharge medications will be authorized once you are discharged, as it is imperative that you return to your primary care physician (or establish a relationship with a primary care physician if you do not have one) for your aftercare needs so that they can reassess your need for medications and monitor your lab values.      Discharge Instructions    Diet - low sodium heart healthy    Complete by:  As directed      Diet - low sodium heart healthy    Complete by:  As directed      Diet - low sodium heart healthy    Complete by:  As directed      Increase activity slowly    Complete by:  As directed      Increase activity slowly    Complete by:  As directed      Increase activity slowly    Complete by:  As directed             Medication List    STOP taking these medications        doxycycline 100 MG capsule  Commonly known as:  VIBRAMYCIN     oxyCODONE-acetaminophen 7.5-325 MG per tablet  Commonly known as:  PERCOCET      TAKE these medications        clonazePAM 0.5 MG tablet  Commonly known as:  KLONOPIN  Take 1 tablet (0.5 mg total) by mouth 2 (two) times daily.     escitalopram 20 MG tablet  Commonly known as:  LEXAPRO  Take 1 tablet (20 mg total) by mouth at bedtime.     levothyroxine 150 MCG tablet  Commonly known as:  SYNTHROID, LEVOTHROID  Take 150 mcg by mouth daily.     lisinopril 2.5 MG tablet  Commonly known as:  PRINIVIL,ZESTRIL  Take 1 tablet (2.5 mg total) by mouth daily.     metoprolol succinate 25 MG 24 hr tablet  Commonly known as:  TOPROL-XL  Take 1.5 tablets (37.5 mg total) by mouth daily.  Take with or immediately following a meal.       No Known Allergies Follow-up Information    Follow up with Carloyn Jaeger, MD.   Specialty:  Psychiatry   Contact information:   [place] [address] [phone]       Follow up with Dennard Nip, NP.   Specialty:  Psychiatry   Contact information:   [facility] [address] [place] [address] [phone]       Follow up with Stress test.   Why:  The office scheduler will call you with the appt date and time.    Contact information:   [place] [address] [phone]       The results of significant diagnostics from this hospitalization (including imaging, microbiology, ancillary and laboratory) are listed below for reference.    Significant Diagnostic Studies: Dg Chest 2 View  07/04/2014   CLINICAL DATA:  Left-sided chest pain with dizziness, headache, shortness of breath beginning around 11 a.m. Symptoms worse around 1 p.m. History of hypertension.  EXAM: CHEST  2 VIEW  COMPARISON:  04/25/2014  FINDINGS: The heart size and mediastinal contours are within normal limits. Both lungs are clear. The visualized skeletal structures are unremarkable.  IMPRESSION: No active cardiopulmonary disease.   Electronically Signed   By: Nolon Nations M.D.   On: 07/04/2014 15:21   Ct Head Wo Contrast  07/04/2014   CLINICAL DATA:  Weakness with severe frontal head pain and left chest pain.  EXAM: CT HEAD WITHOUT CONTRAST  TECHNIQUE: Contiguous axial images were obtained from the base of the skull through the vertex without intravenous contrast.  COMPARISON:  12/26/2012  FINDINGS: Skull and Sinuses:Negative for fracture or destructive process. The mastoids, middle ears, and imaged paranasal sinuses are clear.  Orbits: No acute abnormality.  Brain: No evidence of acute infarction, hemorrhage, hydrocephalus, or mass lesion/mass effect.  IMPRESSION: Normal head CT.    Electronically Signed   By: Monte Fantasia M.D.   On: 07/04/2014 22:04   Mri Brain Without Contrast  07/05/2014   CLINICAL DATA:  Syncopal episode for 3 minutes this morning associated with chest pain, dizziness, headache and shortness of breath. History of hypertension and migraines.  EXAM: MRI HEAD WITHOUT CONTRAST  TECHNIQUE: Multiplanar, multiecho pulse sequences of the brain and surrounding structures were obtained without intravenous contrast.  COMPARISON:  CT of the head July 04, 2014 at 21 33 hours  FINDINGS: The ventricles and sulci are normal for patient's age. No abnormal parenchymal signal, mass lesions, mass effect. No reduced diffusion to suggest acute ischemia. No susceptibility artifact to suggest hemorrhage.  No abnormal extra-axial fluid collections. No extra-axial masses though, contrast enhanced sequences would be more sensitive. Normal major intracranial vascular flow voids seen at the skull base.  Ocular globes and orbital contents are unremarkable though not tailored for evaluation. No abnormal sellar expansion. Trace paranasal sinus mucosal thickening with RIGHT maxillary sinus mucosal retention cyst. The mastoid air cells are well aerated. No suspicious calvarial bone marrow signal. No abnormal sellar expansion. Craniocervical junction maintained.  IMPRESSION: Normal noncontrast MRI of the brain.   Electronically Signed   By: Elon Alas   On: 07/05/2014 01:07    Microbiology: No results found for this or any previous visit (from the past 240 hour(s)).   Labs: Basic Metabolic Panel:  Recent Labs Lab 07/04/14 1355 07/04/14 2358 07/05/14 0609 07/06/14 0341 07/07/14 0425 07/08/14 0503  NA 136  --  138 138 139 140  K 3.5  --  3.2* 3.8 4.0 4.1  CL 106  --  105 106 106 107  CO2 23  --  26 21 23 24   GLUCOSE 90  --  118* 100* 89 79  BUN 6  --  10 11 11 11   CREATININE 0.91 1.03 1.08 0.90 1.00 1.10  CALCIUM 9.0  --  8.5 8.7 8.7 8.9   Liver Function Tests:  Recent  Labs Lab 07/05/14 0609 07/06/14 0341 07/07/14 0425 07/08/14 0503  AST 20 16 18 20   ALT 15 13 14 14   ALKPHOS 74 76 67 77  BILITOT 0.6 0.3 0.3 0.3  PROT 5.9* 5.8* 5.6* 5.8*  ALBUMIN 3.3* 3.1* 3.2* 3.2*   No results for input(s): LIPASE, AMYLASE in the last 168 hours. No results for input(s): AMMONIA in the last 168 hours. CBC:  Recent Labs Lab 07/04/14 2358 07/05/14  4103 07/06/14 0341 07/07/14 0425 07/08/14 0503  WBC 10.2 7.7 8.4 8.8 8.4  NEUTROABS  --  4.7 4.7 5.6 4.8  HGB 12.5 12.0 12.1 12.1 11.9*  HCT 38.0 36.7 37.0 36.8 36.7  MCV 95.7 96.1 97.6 96.1 97.1  PLT 285 270 262 250 254   Cardiac Enzymes:  Recent Labs Lab 07/04/14 2358 07/05/14 0609 07/05/14 1133  TROPONINI <0.03 <0.03 <0.03   BNP: BNP (last 3 results)  Recent Labs  07/04/14 2358 07/06/14 1115  BNP 12.2 26.5    ProBNP (last 3 results) No results for input(s): PROBNP in the last 8760 hours.  CBG:  Recent Labs Lab 07/06/14 0525 07/07/14 0615 07/08/14 0607 07/09/14 0650 07/10/14 0529  GLUCAP 99 97 76 82 81       Signed:  Julyssa Kyer K  [facility] 07/10/2014, 11:36 AM
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
## 2014-07-10 NOTE — Progress Notes (Signed)
UR completed 

## 2014-07-11 ENCOUNTER — Telehealth (HOSPITAL_COMMUNITY): Payer: Self-pay | Admitting: *Deleted

## 2014-07-15 ENCOUNTER — Encounter: Payer: Self-pay | Admitting: Internal Medicine

## 2014-08-05 ENCOUNTER — Telehealth (HOSPITAL_COMMUNITY): Payer: Self-pay

## 2014-08-05 NOTE — Telephone Encounter (Signed)
Encounter complete. 

## 2014-08-09 ENCOUNTER — Ambulatory Visit (INDEPENDENT_AMBULATORY_CARE_PROVIDER_SITE_OTHER): Payer: BLUE CROSS/BLUE SHIELD | Admitting: Licensed Clinical Social Worker

## 2014-08-09 DIAGNOSIS — F332 Major depressive disorder, recurrent severe without psychotic features: Secondary | ICD-10-CM | POA: Diagnosis not present

## 2014-08-09 DIAGNOSIS — F411 Generalized anxiety disorder: Secondary | ICD-10-CM | POA: Diagnosis not present

## 2014-08-10 ENCOUNTER — Inpatient Hospital Stay (HOSPITAL_COMMUNITY): Admission: RE | Admit: 2014-08-10 | Payer: Self-pay | Source: Ambulatory Visit

## 2014-08-24 ENCOUNTER — Ambulatory Visit: Payer: BLUE CROSS/BLUE SHIELD | Admitting: Licensed Clinical Social Worker

## 2014-08-30 ENCOUNTER — Ambulatory Visit (INDEPENDENT_AMBULATORY_CARE_PROVIDER_SITE_OTHER): Payer: BLUE CROSS/BLUE SHIELD | Admitting: Licensed Clinical Social Worker

## 2014-08-30 DIAGNOSIS — F3181 Bipolar II disorder: Secondary | ICD-10-CM

## 2014-09-06 ENCOUNTER — Ambulatory Visit (INDEPENDENT_AMBULATORY_CARE_PROVIDER_SITE_OTHER): Payer: BLUE CROSS/BLUE SHIELD | Admitting: Licensed Clinical Social Worker

## 2014-09-06 DIAGNOSIS — F3181 Bipolar II disorder: Secondary | ICD-10-CM | POA: Diagnosis not present

## 2014-09-27 ENCOUNTER — Ambulatory Visit (INDEPENDENT_AMBULATORY_CARE_PROVIDER_SITE_OTHER): Payer: BLUE CROSS/BLUE SHIELD | Admitting: Licensed Clinical Social Worker

## 2014-09-27 DIAGNOSIS — F411 Generalized anxiety disorder: Secondary | ICD-10-CM

## 2014-09-27 DIAGNOSIS — F332 Major depressive disorder, recurrent severe without psychotic features: Secondary | ICD-10-CM

## 2014-10-03 ENCOUNTER — Ambulatory Visit (INDEPENDENT_AMBULATORY_CARE_PROVIDER_SITE_OTHER): Payer: BLUE CROSS/BLUE SHIELD | Admitting: Diagnostic Neuroimaging

## 2014-10-03 ENCOUNTER — Encounter: Payer: Self-pay | Admitting: Diagnostic Neuroimaging

## 2014-10-03 VITALS — BP 156/104 | HR 78 | Ht 68.0 in | Wt 224.6 lb

## 2014-10-03 DIAGNOSIS — M542 Cervicalgia: Secondary | ICD-10-CM | POA: Diagnosis not present

## 2014-10-03 DIAGNOSIS — R51 Headache: Secondary | ICD-10-CM

## 2014-10-03 DIAGNOSIS — G43101 Migraine with aura, not intractable, with status migrainosus: Secondary | ICD-10-CM | POA: Diagnosis not present

## 2014-10-03 DIAGNOSIS — R519 Headache, unspecified: Secondary | ICD-10-CM

## 2014-10-03 MED ORDER — TOPIRAMATE 50 MG PO TABS: 50.0000 mg | ORAL_TABLET | Freq: Two times a day (BID) | ORAL | Status: DC

## 2014-10-03 MED ORDER — RIZATRIPTAN BENZOATE 10 MG PO TBDP: 10.0000 mg | ORAL_TABLET | ORAL | Status: DC | PRN

## 2014-10-03 NOTE — Patient Instructions (Addendum)
Start topiramate 50mg  at bedtime; after 1 week increase to twice a day.  Use rizatriptan as needed for breakthrough migraine.  Limit fioricet usage (taper down).  Follow up with eye doctor.
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
## 2014-10-10 ENCOUNTER — Ambulatory Visit (INDEPENDENT_AMBULATORY_CARE_PROVIDER_SITE_OTHER): Payer: BLUE CROSS/BLUE SHIELD | Admitting: Licensed Clinical Social Worker

## 2014-10-10 DIAGNOSIS — F411 Generalized anxiety disorder: Secondary | ICD-10-CM | POA: Diagnosis not present

## 2014-10-10 DIAGNOSIS — F332 Major depressive disorder, recurrent severe without psychotic features: Secondary | ICD-10-CM

## 2014-10-28 DIAGNOSIS — N80209 Endometriosis of unspecified fallopian tube, unspecified depth: Secondary | ICD-10-CM | POA: Insufficient documentation

## 2014-10-28 DIAGNOSIS — E039 Hypothyroidism, unspecified: Secondary | ICD-10-CM | POA: Insufficient documentation

## 2014-10-28 DIAGNOSIS — N7011 Chronic salpingitis: Secondary | ICD-10-CM | POA: Insufficient documentation

## 2014-11-15 ENCOUNTER — Ambulatory Visit: Payer: BLUE CROSS/BLUE SHIELD | Admitting: Diagnostic Neuroimaging

## 2014-11-16 ENCOUNTER — Encounter: Payer: Self-pay | Admitting: Diagnostic Neuroimaging

## 2014-11-21 ENCOUNTER — Ambulatory Visit (INDEPENDENT_AMBULATORY_CARE_PROVIDER_SITE_OTHER): Payer: Self-pay | Admitting: Licensed Clinical Social Worker

## 2014-11-21 DIAGNOSIS — F411 Generalized anxiety disorder: Secondary | ICD-10-CM

## 2014-11-21 DIAGNOSIS — F332 Major depressive disorder, recurrent severe without psychotic features: Secondary | ICD-10-CM

## 2015-03-28 ENCOUNTER — Telehealth: Payer: Self-pay | Admitting: *Deleted

## 2015-03-28 NOTE — Telephone Encounter (Signed)
Form,[employer] received from [employer] sent to [facility] C and Dr Leta Baptist 03/28/15.

## 2015-03-31 NOTE — Telephone Encounter (Signed)
Spoke with patient and informed her that she was seen once in this office last July, did not return for her FU, therefore she needs to be seen for FU prior to paperwork being completed. Also informed her that typically papers are not completed for short term disability due to migraines, but FMLA papers can be completed. She stated that this office "has already done this for me once, but the company said they never got it". This RN unable to find any documentation in EPIC of this paper being previously completed. Ms April Dyer then stated she would like to come back, pick up papers and receive refund of her $25. Advised her of office hours. She verbalized understanding, appreciation.  Papers given to Butch Penny, MR dept informing her pt will pick up next week.

## 2015-07-25 ENCOUNTER — Emergency Department (HOSPITAL_COMMUNITY)
Admission: EM | Admit: 2015-07-25 | Discharge: 2015-07-25 | Disposition: A | Discharge status: Home / Self Care | Payer: 59 | Attending: Emergency Medicine | Admitting: Emergency Medicine

## 2015-07-25 ENCOUNTER — Emergency Department (HOSPITAL_COMMUNITY): Payer: 59

## 2015-07-25 DIAGNOSIS — Z79899 Other long term (current) drug therapy: Secondary | ICD-10-CM | POA: Diagnosis not present

## 2015-07-25 DIAGNOSIS — Z8719 Personal history of other diseases of the digestive system: Secondary | ICD-10-CM | POA: Insufficient documentation

## 2015-07-25 DIAGNOSIS — R42 Dizziness and giddiness: Secondary | ICD-10-CM | POA: Diagnosis not present

## 2015-07-25 DIAGNOSIS — Z8742 Personal history of other diseases of the female genital tract: Secondary | ICD-10-CM | POA: Diagnosis not present

## 2015-07-25 DIAGNOSIS — E039 Hypothyroidism, unspecified: Secondary | ICD-10-CM | POA: Insufficient documentation

## 2015-07-25 DIAGNOSIS — I10 Essential (primary) hypertension: Secondary | ICD-10-CM | POA: Insufficient documentation

## 2015-07-25 DIAGNOSIS — R197 Diarrhea, unspecified: Secondary | ICD-10-CM | POA: Insufficient documentation

## 2015-07-25 DIAGNOSIS — G43909 Migraine, unspecified, not intractable, without status migrainosus: Secondary | ICD-10-CM

## 2015-07-25 DIAGNOSIS — Z8673 Personal history of transient ischemic attack (TIA), and cerebral infarction without residual deficits: Secondary | ICD-10-CM | POA: Diagnosis not present

## 2015-07-25 DIAGNOSIS — R109 Unspecified abdominal pain: Secondary | ICD-10-CM | POA: Diagnosis not present

## 2015-07-25 DIAGNOSIS — G43009 Migraine without aura, not intractable, without status migrainosus: Secondary | ICD-10-CM | POA: Diagnosis not present

## 2015-07-25 DIAGNOSIS — R410 Disorientation, unspecified: Secondary | ICD-10-CM | POA: Diagnosis not present

## 2015-07-25 DIAGNOSIS — F329 Major depressive disorder, single episode, unspecified: Secondary | ICD-10-CM | POA: Diagnosis not present

## 2015-07-25 DIAGNOSIS — R51 Headache: Secondary | ICD-10-CM | POA: Diagnosis present

## 2015-07-25 LAB — I-STAT BETA HCG BLOOD, ED (MC, WL, AP ONLY): I-stat hCG, quantitative: <5 m[IU]/mL (ref <5)

## 2015-07-25 LAB — COMPREHENSIVE METABOLIC PANEL
ALBUMIN: 3.7 g/dL (ref 3.5–5.0)
ALT: 16 U/L (ref 14–54)
ANION GAP: 7 (ref 5–15)
AST: 20 U/L (ref 15–41)
Alkaline Phosphatase: 60 U/L (ref 38–126)
BUN: 11 mg/dL (ref 6–20)
CALCIUM: 8.5 mg/dL — AB (ref 8.9–10.3)
CO2: 18 mmol/L — AB (ref 22–32)
Chloride: 114 mmol/L — ABNORMAL HIGH (ref 101–111)
Creatinine, Ser: 0.89 mg/dL (ref 0.44–1.00)
GFR calc Af Amer: >60 mL/min (ref >60)
GFR calc non Af Amer: >60 mL/min (ref >60)
GLUCOSE: 92 mg/dL (ref 65–99)
POTASSIUM: 4.1 mmol/L (ref 3.5–5.1)
SODIUM: 139 mmol/L (ref 135–145)
Total Bilirubin: 0.5 mg/dL (ref 0.3–1.2)
Total Protein: 6.3 g/dL — ABNORMAL LOW (ref 6.5–8.1)

## 2015-07-25 LAB — CBC
HEMATOCRIT: 41.8 % (ref 36.0–46.0)
HEMOGLOBIN: 13.9 g/dL (ref 12.0–15.0)
MCH: 31.7 pg (ref 26.0–34.0)
MCHC: 33.3 g/dL (ref 30.0–36.0)
MCV: 95.2 fL (ref 78.0–100.0)
Platelets: 255 10*3/uL (ref 150–400)
RBC: 4.39 MIL/uL (ref 3.87–5.11)
RDW: 13.4 % (ref 11.5–15.5)
WBC: 10 10*3/uL (ref 4.0–10.5)

## 2015-07-25 LAB — CBG MONITORING, ED: Glucose-Capillary: 85 mg/dL (ref 65–99)

## 2015-07-25 MED ORDER — SODIUM CHLORIDE 0.9 % IV BOLUS (SEPSIS)
1000.0000 mL | Freq: Once | INTRAVENOUS | Status: AC
  Administered 2015-07-25: 1000 mL via INTRAVENOUS

## 2015-07-25 MED ORDER — ONDANSETRON 4 MG PO TBDP
8.0000 mg | ORAL_TABLET | Freq: Once | ORAL | Status: AC
Start: 2015-07-25 — End: 2015-07-25
  Administered 2015-07-25: 8 mg via ORAL
  Filled 2015-07-25: qty 2

## 2015-07-25 MED ORDER — DIPHENHYDRAMINE HCL 50 MG/ML IJ SOLN
25.0000 mg | Freq: Once | INTRAMUSCULAR | Status: AC
  Administered 2015-07-25: 25 mg via INTRAVENOUS
  Filled 2015-07-25: qty 1

## 2015-07-25 MED ORDER — HYDROCODONE-ACETAMINOPHEN 5-325 MG PO TABS
2.0000 | ORAL_TABLET | Freq: Once | ORAL | Status: AC
  Administered 2015-07-25: 2 via ORAL
  Filled 2015-07-25: qty 2

## 2015-07-25 MED ORDER — MORPHINE SULFATE (PF) 4 MG/ML IV SOLN
4.0000 mg | INTRAVENOUS | Status: DC | PRN
Start: 2015-07-25 — End: 2015-07-25

## 2015-07-25 MED ORDER — METOCLOPRAMIDE HCL 5 MG/ML IJ SOLN
10.0000 mg | Freq: Once | INTRAMUSCULAR | Status: AC
  Administered 2015-07-25: 10 mg via INTRAVENOUS
  Filled 2015-07-25: qty 2

## 2015-07-25 MED ORDER — KETOROLAC TROMETHAMINE 30 MG/ML IJ SOLN
30.0000 mg | Freq: Once | INTRAMUSCULAR | Status: AC
Start: 2015-07-25 — End: 2015-07-25
  Administered 2015-07-25: 30 mg via INTRAVENOUS
  Filled 2015-07-25: qty 1

## 2015-07-25 NOTE — ED Provider Notes (Signed)
CSN: [id number]     Arrival date & time 07/25/15  1423 History   First MD Initiated Contact with Patient 07/25/15 1515     No chief complaint on file.    (Consider location/radiation/quality/duration/timing/severity/associated sxs/prior Treatment) The history is provided by the patient, the spouse and a relative.   Is a 45 year old female with a history of hypertension, migraines, hypothyroid, diabetes who presents to ED via EMS with headache, dizziness, and confusion. Patient states she's been having a progressively worsening all over headache for 4 days, worse on the right side of her head and face, throbbing, nonradiating. She states the headache got significantly worse today at 1330 when she was at work with associated dizziness. Coworkers state she was confused. She states she has not taken anything for the pain. She states associated photosensitivity, dizziness, seeing spots, mild abdominal pain, nausea and feeling hot. She endorses 2 episodes of vomiting today. Patient states she remembers going outside to get some air which was minutes before EMS arrived. She states she does not remember the ride to the hospital. She also endorses mild shortness of breath this morning. She states she has not eaten anything today. She denies numbness/tingling, weakness,hematochezia, hematemesis, dysphagia, facial droop, speech difficulties.  Past Medical History  Diagnosis Date  . Hypertension     2011  . Depression     1996-currently untreated as did not like monotone emotions on treatment.   Marland Kitchen GERD (gastroesophageal reflux disease)     since 2007treated with Zegrid 60mg  (omeprazole and sodium bicarb -atypical regimen  . Migraines     2005  . Fallopian tube disorder     diagnostic laparascopy 1993 shoed left sidecd blocked tube. HSG in 2005 showed bilateral blockage. 2007 test HSG inconclusive.   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   .  Hypothyroid   . CVA (cerebral infarction) 06/2014    ? mini strokes   Past Surgical History  Procedure Laterality Date  . Myomectomy  2014  . Salpingectomy  1993  . Hand surgery Right 2006   Family History  Problem Relation Age of Onset  . Diabetes type II      mom, sister, grandparents, aunts/uncles  . Hypertension      mom, brother, grandparents  . Hyperlipidemia      aunts/uncles  . Stroke      grandparents  . Breast cancer      aunt in 44s  . Diabetes type II Mother   . Hypertension Maternal Grandmother   . Hypertension Maternal Grandfather    Social History  Substance Use Topics  . Smoking status: Never Smoker   . Smokeless tobacco: Not on file  . Alcohol Use: 0.0 oz/week     Comment: rare   OB History    No data available     Review of Systems  Constitutional: Negative for fever and chills.  HENT: Negative for congestion, ear pain and trouble swallowing.   Eyes: Positive for photophobia. Negative for pain, discharge and visual disturbance.  Respiratory: Negative for cough, chest tightness and shortness of breath.   Cardiovascular: Negative for chest pain and leg swelling.  Gastrointestinal: Positive for nausea, vomiting, abdominal pain and diarrhea. Negative for constipation, blood in stool and abdominal distention.  Genitourinary: Negative for dysuria, hematuria and vaginal discharge.  Musculoskeletal: Negative for myalgias, arthralgias, neck pain and neck stiffness.  Skin: Negative for rash.  Neurological: Positive for dizziness and headaches.  Negative for syncope, speech difficulty, weakness and numbness.  Psychiatric/Behavioral: Positive for confusion. Negative for agitation.      Allergies  Review of patient's allergies indicates no known allergies.  Home Medications   Prior to Admission medications   Medication Sig Start Date End Date Taking? Authorizing Provider  Butalbital-APAP-Caff-Cod 50-300-40-30 MG CAPS Take 1 tablet by mouth daily as  needed (migraine).    Yes Historical Provider, MD  escitalopram (LEXAPRO) 20 MG tablet Take 1 tablet (20 mg total) by mouth at bedtime. 07/08/14  Yes Annita Brod, MD  labetalol (NORMODYNE) 300 MG tablet Take 300 mg by mouth 2 (two) times daily.   Yes Historical Provider, MD  levothyroxine (SYNTHROID, LEVOTHROID) 150 MCG tablet Take 150 mcg by mouth daily. 05/14/14  Yes Historical Provider, MD  PROAIR HFA 108 (90 BASE) MCG/ACT inhaler USE 2 PUFFS 4 TIMES A DAY 07/15/14  Yes Historical Provider, MD  progesterone (PROMETRIUM) 200 MG capsule Take 200 mg by mouth 2 (two) times daily. 06/29/15  Yes Historical Provider, MD  rizatriptan (MAXALT-MLT) 10 MG disintegrating tablet Take 1 tablet (10 mg total) by mouth as needed for migraine. May repeat in 2 hours if needed 10/03/14  Yes Penni Bombard, MD  topiramate (TOPAMAX) 50 MG tablet Take 1 tablet (50 mg total) by mouth 2 (two) times daily. 10/03/14  Yes Vikram R Penumalli, MD   BP 157/96 mmHg  Pulse 92  Temp(Src) 98.7 F (37.1 C) (Oral)  Resp 17  SpO2 99% Physical Exam  Constitutional: She appears well-developed and well-nourished. No distress.  HENT:  Head: Normocephalic and atraumatic.  Mouth/Throat: Oropharynx is clear and moist.  Eyes: Conjunctivae and EOM are normal. Pupils are equal, round, and reactive to light. Right eye exhibits no discharge. Left eye exhibits no discharge.  Neck: Normal range of motion. Neck supple.  Cardiovascular: Normal rate, regular rhythm and normal heart sounds.   Pulses:      Radial pulses are 2+ on the right side, and 2+ on the left side.       Dorsalis pedis pulses are 2+ on the right side, and 2+ on the left side.  Pulmonary/Chest: Effort normal and breath sounds normal. No respiratory distress. She has no wheezes.  Abdominal: Soft. Bowel sounds are normal. She exhibits no distension. There is no tenderness.  Musculoskeletal: Normal range of motion. She exhibits no edema.  Neurological: She is alert.  Coordination normal.  Patient not oriented to place, time. She was unable to recognize her family and state their names. She does know her name. Cognitive impairment noted, unable to count backward from 10 Speech is clear and goal oriented, follows commands Major Cranial nerves without deficit, no facial droop Normal strength in upper and lower extremities bilaterally including dorsiflexion and plantar flexion, strong and equal grip strength Sensation normal to light and sharp touch Moves extremities without ataxia, coordination intact Normal finger to nose  Normal gait Normal balance  Skin: Skin is warm and dry.  Psychiatric: She has a normal mood and affect. Her speech is not slurred. Cognition and memory are impaired.    ED Course  Procedures (including critical care time)   1745: Pt is now sleeping. We'll reassess her in 1 hour.  1840: Patient is resting comfortably she states her pain is now 2/10 she still states she is photosensitive. Will give another liter of fluids and reassess her for discharge.  1945: Patient states her headache is gone but she is still having photosensitivity. Will have  her finish the bag of fluids and reassess.  2030: Patient states her photosensitivity is improving. She she states she has a frontal headache that is 2 out of 10. I will give her 2 Norco and Zofran and discharge her. She states that she is comfortable taking pain medication and going home. Patient states she does not like to eat, she feels it is unnecessary. I discussed the importance of proper nutrition with her. I offered to give her a referral for a nutritionist in her discharge instructions. She was amenable to that and states she will contact the nutritionist. I discussed that her migraine was exacerbated by her dehydration and lack of food.  Labs Review Labs Reviewed  COMPREHENSIVE METABOLIC PANEL - Abnormal; Notable for the following:    Chloride 114 (*)    CO2 18 (*)    Calcium 8.5  (*)    Total Protein 6.3 (*)    All other components within normal limits  CBC  CBG MONITORING, ED  I-STAT BETA HCG BLOOD, ED ([facility], [facility], [facility] ONLY)    Imaging Review Ct Head Wo Contrast  07/25/2015  CLINICAL DATA:  Headache, confusion and acute mental status change. EXAM: CT HEAD WITHOUT CONTRAST TECHNIQUE: Contiguous axial images were obtained from the base of the skull through the vertex without intravenous contrast. COMPARISON:  07/05/2014 FINDINGS: No acute cortical infarct, hemorrhage, or mass lesion ispresent. Ventricles are of normal size. No significant extra-axial fluid collection is present. The paranasal sinuses andmastoid air cells are clear. The osseous skull is intact. IMPRESSION: Negative exam. Electronically Signed   By: Kerby Moors M.D.   On: 07/25/2015 17:00   I have personally reviewed and evaluated these images and lab results as part of my medical decision-making.   EKG Interpretation   Date/Time:  Tuesday Jul 25 2015 15:05:56 EDT Ventricular Rate:  81 PR Interval:  172 QRS Duration: 80 QT Interval:  359 QTC Calculation: 417 R Axis:   41 Text Interpretation:  Sinus rhythm Nonspecific T abnormalities, diffuse  leads Minimal ST elevation, lateral leads No significant change since last  tracing Abnormal ekg Confirmed by Carmin Muskrat  MD [id number]) on 07/25/2015  3:14:09 PM      MDM   Final diagnoses:  Migraine without status migrainosus, not intractable, unspecified migraine type  Confusion    Is a 45 year old female with a history of hypertension, migraines, hypothyroid, diabetes who presents to ED via EMS with headache, dizziness, and confusion. No neurological deficits to suggest acute intracranial infarct. The patient did have cognitive impairment on physical exam. EKG showed no significant changes since last EKG. CT showed no acute infarct, hemorrhage or mass. Patient without shortness of breath, chest pain, recent travel less concerning for PE. This is likely  a complicated migraine. We'll give patient fluids a migraine cocktail. Pt's headache resolved while in the ED.  Labs and imaging reassuring. No concern for intracranial pathology. Patient will be discharged home and instructed to follow-up with her primary care provider, neurologist, and nutritionist. Discussed strict return precautions.  I discussed all of the results with the patient and family members they have expressed their understanding to the verbal discharge instructions.        Kalman Drape, [place] 07/25/15 [place], MD 07/25/15 [phone]

## 2015-07-25 NOTE — Discharge Instructions (Signed)
Follow-up with your primary care provider and your neurologist within one day regarding your visit to the emergency department. I have given you the referral information for a nutritionist.  Return to the emergency department if he experienced worsening headaches, visual changes, nausea, vomiting, numbness/tingling, or weakness.   Migraine Headache A migraine headache is an intense, throbbing pain on one or both sides of your head. A migraine can last for 30 minutes to several hours. CAUSES  The exact cause of a migraine headache is not always known. However, a migraine may be caused when nerves in the brain become irritated and release chemicals that cause inflammation. This causes pain. Certain things may also trigger migraines, such as:  Alcohol.  Smoking.  Stress.  Menstruation.  Aged cheeses.  Foods or drinks that contain nitrates, glutamate, aspartame, or tyramine.  Lack of sleep.  Chocolate.  Caffeine.  Hunger.  Physical exertion.  Fatigue.  Medicines used to treat chest pain (nitroglycerine), birth control pills, estrogen, and some blood pressure medicines. SIGNS AND SYMPTOMS  Pain on one or both sides of your head.  Pulsating or throbbing pain.  Severe pain that prevents daily activities.  Pain that is aggravated by any physical activity.  Nausea, vomiting, or both.  Dizziness.  Pain with exposure to bright lights, loud noises, or activity.  General sensitivity to bright lights, loud noises, or smells. Before you get a migraine, you may get warning signs that a migraine is coming (aura). An aura may include:  Seeing flashing lights.  Seeing bright spots, halos, or zigzag lines.  Having tunnel vision or blurred vision.  Having feelings of numbness or tingling.  Having trouble talking.  Having muscle weakness. DIAGNOSIS  A migraine headache is often diagnosed based on:  Symptoms.  Physical exam.  A CT scan or MRI of your head. These  imaging tests cannot diagnose migraines, but they can help rule out other causes of headaches. TREATMENT Medicines may be given for pain and nausea. Medicines can also be given to help prevent recurrent migraines.  HOME CARE INSTRUCTIONS  Only take over-the-counter or prescription medicines for pain or discomfort as directed by your health care provider. The use of long-term narcotics is not recommended.  Lie down in a dark, quiet room when you have a migraine.  Keep a journal to find out what may trigger your migraine headaches. For example, write down:  What you eat and drink.  How much sleep you get.  Any change to your diet or medicines.  Limit alcohol consumption.  Quit smoking if you smoke.  Get 7-9 hours of sleep, or as recommended by your health care provider.  Limit stress.  Keep lights dim if bright lights bother you and make your migraines worse. SEEK IMMEDIATE MEDICAL CARE IF:   Your migraine becomes severe.  You have a fever.  You have a stiff neck.  You have vision loss.  You have muscular weakness or loss of muscle control.  You start losing your balance or have trouble walking.  You feel faint or pass out.  You have severe symptoms that are different from your first symptoms. MAKE SURE YOU:   Understand these instructions.  Will watch your condition.  Will get help right away if you are not doing well or get worse.   This information is not intended to replace advice given to you by your health care provider. Make sure you discuss any questions you have with your health care provider.   Document Released:  03/11/2005 Document Revised: 04/01/2014 Document Reviewed: 11/16/2012 Elsevier Interactive Patient Education [employer].

## 2015-07-25 NOTE — ED Notes (Signed)
Pt in from [employer] where she works via [employer], per report pt drove to work after seeing her MD for dehydration, recently diagnosed with eating disorder per her co workers, pts co workers called EMS after the pt went to work with HA, pt confused, vitals WNL, pt disoriented x 4, pt rcvd 400 mL NS pta, pt not orthostatic upon arrival to ED

## 2015-07-25 NOTE — ED Notes (Signed)
Discharge instructions reviewed - voiced understanding 

## 2015-07-31 ENCOUNTER — Encounter: Payer: Self-pay | Admitting: Family Medicine

## 2015-07-31 ENCOUNTER — Ambulatory Visit (INDEPENDENT_AMBULATORY_CARE_PROVIDER_SITE_OTHER): Payer: 59 | Admitting: Family Medicine

## 2015-07-31 VITALS — Ht 68.0 in | Wt 219.0 lb

## 2015-07-31 DIAGNOSIS — E669 Obesity, unspecified: Secondary | ICD-10-CM

## 2015-07-31 DIAGNOSIS — F5001 Anorexia nervosa, restricting type: Secondary | ICD-10-CM | POA: Diagnosis not present

## 2015-07-31 NOTE — Progress Notes (Signed)
Medical Nutrition Therapy:  Appt start time: [id number] end time:  [id number] and 1630.  Assessment:  Primary concerns today: poor diet & assessment for anorexia nervosa.  Ms. April Dyer was in the ER on May 2, having passed out at work.  She has been told that her not eating may be triggering her migraines.       April Dyer is trying to get pregnant, and is on fertility med's.  She works for patient billing at [employer] she was banned from the gym at work in February by the trainer there b/c she wasn't eating.  She struggles with her weight, and eats very little, but eats erratically.  She has been eating this way since at least adolescence.  April Dyer does not remember ever eating three meals a day, although she thinks she did at some time in her childhood.  Food is a low priority for her, and she tends to restrict rather than eat when stressed. April Dyer's restriction has been getting progressively worse; she has days of not eating at all.  A year ago, April Dyer was hospitalized for migraines, anxiety, and depression.  She said she is under constant stress.  Her mother, who is increasingly dependent, lives with her, as does her 31-YO son, who has a number of health-related and psycho-social problems.  She rarely sleeps b/c of her husband's sleep apnea, which is so far untreated.  In 2007, April Dyer was not eating, and was drinking up to 32 cups of coffee/day.  April Dyer said a large part of her stress has been related to infertility, which she now has been told was an inaccurate diagnosis, although her age may preclude pregnancy at this time.  I did emphasize that her current eating patterns are absolutely not conducive to a healthy pregnancy or outcome.  Learning Readiness: Contemplating; not sure she can tolerate eating more than 900 or 1000 kcal/day.    Usual eating pattern includes 1 meal and 0-1 snack per day.   Frequent foods and beverages include vegetables, (organic) coffee w/ 1 1/2 tsp sugar/cup, tea (blk, grn, or  herbal) with 1 tsp honey/cup, diet flavored water, boiled egg.  Gets 0-160 oz water daily, although she has had no water recently despite recommendations from the ER on 5/2 to drink plenty.  Avoided foods include most meats, most fruit, sweets, fried foods.  April Dyer said eating feels like just too much work.   Usual physical activity includes none; she said she has absolutely no energy.    April Dyer meets the criteria for anorexia nervosa restricting type despite her BMI of >33.    24-hr recall suggests an intake of ~600: (Up at 10 AM) B ( AM)-    Snk (11 AM)-   1 c coffee, 1 1/2 tsp sugar           24 L (1 PM)-  3/4 of steak omelette (3-egg) w/ spin, avocado, & mushrms <300 Snk ( PM)-   D (9 PM)-  2 small lobster tails, 7 mussels, 1/2 c pasta    <300 Snk ( PM)-   Typical day? Yes.  for a Sunday, but most days include only one meal a day.    Progress Towards Goal(s):  In progress.   Nutritional Diagnosis:  NB-1.5 Disordered eating pattern As related to food anxiety.  As evidenced by erratic pattern of eating ranging from 0-2 eating episodes per day.    Intervention:  Nutrition counseling.  Handouts given during visit include:  AVS  Demonstrated degree  of understanding via:  Teach Back  Barriers to learning/adherence to lifestyle change: Severe anxiety and perceived need to take care of everyone but herself is a major obstacle to self-care.  Sleep deprivation is probably a significant contributor to stress, depression, and anxiety.    Monitoring/Evaluation:  Dietary intake, exercise, and body weight in 4 week(s).

## 2015-07-31 NOTE — Patient Instructions (Addendum)
Recommendations - Sleep is crucial to everything else going on in your life right now.    - Be sure to follow through with your husband's sleep study and treatment.  EXPEDITE this as much as possible, and don't take no for an answer.    - Google effects of sleep deprivation.    - Have a frank discussion with your husband about your sleep needs, and his need for self-care as well.   - Eat at least 3 meals and 1 snack per day.  Aim for no more than 5 hours between eating.  Eat breakfast within one hour of getting up.   - Track your eating daily:  Record at least when you eat (times) and if you can manage it, write down what you eat as well.   - Bring your eating record to follow-up:  June 5 at 9 AM.    - Ultimately, you cannot function without eating.  - Drink at least 48 oz of water daily (between meals).

## 2015-08-08 ENCOUNTER — Telehealth: Payer: Self-pay | Admitting: Physician Assistant

## 2015-08-08 ENCOUNTER — Ambulatory Visit: Payer: 59 | Admitting: Physician Assistant

## 2015-08-15 NOTE — Telephone Encounter (Signed)
No charge. Ok to reschedule once more but if she no-shows again will have to schedule with someone else.

## 2015-08-15 NOTE — Telephone Encounter (Signed)
Pt was no show 08/08/15 for new pt appt, pt has not rescheduled, 1st no show, charge or no charge? Reschedule with you if pt calls?

## 2015-08-16 ENCOUNTER — Encounter: Payer: 59 | Attending: Physician Assistant | Admitting: Skilled Nursing Facility1

## 2015-08-16 ENCOUNTER — Encounter: Payer: Self-pay | Admitting: Skilled Nursing Facility1

## 2015-08-16 VITALS — Ht 68.0 in | Wt 219.0 lb

## 2015-08-16 DIAGNOSIS — R739 Hyperglycemia, unspecified: Secondary | ICD-10-CM | POA: Diagnosis present

## 2015-08-16 NOTE — Patient Instructions (Signed)
Asparagus Liver Cabbage Any green Peas Carrots  Peppers Guacamole Eggs and toast Rice Lobster Tails [place] Lamb Mashed Potatoes

## 2015-08-16 NOTE — Progress Notes (Signed)
  Medical Nutrition Therapy:  Appt start time: 1600 end time:  1700.   Assessment:  Primary concerns today: referred for hyperglycemia. Pt states she passed out due to Dehydration and not eating and was admitted to the ED. Pt states she was told she has an eating disorder. Pt states she has had severe migraines for a month causing her to be out of work. Pt states she saw a dietitian which her physician said that particular dietitian's assessment was not what he was looking for so she was referred to this center. Pt states she is currently seeing a therapist for her fear of food that she states her doctor told her she has; pt disagrees with this theory. Pt states she was prescribed phentermine but she never took it. Pt states she is not afraid of food but then stated she eats foods she is not afraid of. Pt states she eats her vegetables before the other foods on the plate. Pt states she cannot eat 1200 calories and does not see how anyone can; Pt states she might eat 600 calories a day.  Pt states she Used to be addicted to coffee and not eat for 3 days but has since stopped drinking coffee in excess (according to her). Pt states she only eats the food that she wants not necessarily that she fears certain foods. Pt states she does not sleep because her husband does not sleep due to his apnea. Pts mother accompanied her to the appointment and agreed the pt eats very little and tries to get her to eat every day.  When dietitian commented it is recommended she eat every day pt scoffed.    Preferred Learning Style:   No preference indicated   Learning Readiness:   Not ready   MEDICATIONS: see list   DIETARY INTAKE:  Usual eating pattern includes 0 meals and 1 snacks per day.  Everyday foods include none identified.  Avoided foods include beets.    24-hr recall:  B ( AM): coffee with creme and sugar Snk ( AM):  L ( PM): bowl of cabbage Snk ( PM): D ( PM): Snk ( PM): Beverages:  Usual  physical activity: not discussed    Progress Towards Goal(s):  In progress.   Nutritional Diagnosis:  NB-1.5 Disordered eating pattern As related to food anxiety.  As evidenced by 24 hr recall, pt report, pt report dizziness/headaches/hypoglycemia.    Intervention:  Nutrition counseling for symptoms from food and fluid deprivation.  Teaching Method Utilized: Dietitian educated patient on the need to eat food everyday and a few times a day, the repercussions of not eating/drinking to properly fuel the body, the importance of consuming glucose and not modeling her food behaviors after her mother who has Type 2 Diabetes, and reducing pressure/stress around meal times.   Goals: Patient will focus on foods that she likes to eat and cause no anxiety. Patient will focus on reducing headaches and getting back to work. Patient will have 3 food events every day. Patient will eat the following diet starting 08/17/15: Sunny side up eggs with toast Beanie weenies with mashed potatoes Cabbage with sugar on top Water with each meal  Barriers to learning/adherence to lifestyle change: food anxiety  Demonstrated degree of understanding via:  Teach Back   Monitoring/Evaluation:  Dietary intake, exercise, symptoms of hypoglycemia, and body weight prn.

## 2015-08-17 ENCOUNTER — Ambulatory Visit: Payer: BLUE CROSS/BLUE SHIELD | Admitting: Diagnostic Neuroimaging

## 2015-08-28 ENCOUNTER — Ambulatory Visit: Payer: Self-pay | Admitting: Family Medicine

## 2016-01-20 ENCOUNTER — Ambulatory Visit (HOSPITAL_COMMUNITY)
Admission: EM | Admit: 2016-01-20 | Discharge: 2016-01-20 | Disposition: A | Discharge status: Home / Self Care | Payer: 59 | Attending: Internal Medicine | Admitting: Internal Medicine

## 2016-01-20 ENCOUNTER — Encounter (HOSPITAL_COMMUNITY): Payer: Self-pay | Admitting: Emergency Medicine

## 2016-01-20 DIAGNOSIS — Z833 Family history of diabetes mellitus: Secondary | ICD-10-CM | POA: Diagnosis not present

## 2016-01-20 DIAGNOSIS — Z9889 Other specified postprocedural states: Secondary | ICD-10-CM | POA: Diagnosis not present

## 2016-01-20 DIAGNOSIS — K219 Gastro-esophageal reflux disease without esophagitis: Secondary | ICD-10-CM | POA: Insufficient documentation

## 2016-01-20 DIAGNOSIS — E039 Hypothyroidism, unspecified: Secondary | ICD-10-CM | POA: Insufficient documentation

## 2016-01-20 DIAGNOSIS — Z803 Family history of malignant neoplasm of breast: Secondary | ICD-10-CM | POA: Diagnosis not present

## 2016-01-20 DIAGNOSIS — F329 Major depressive disorder, single episode, unspecified: Secondary | ICD-10-CM | POA: Insufficient documentation

## 2016-01-20 DIAGNOSIS — I10 Essential (primary) hypertension: Secondary | ICD-10-CM | POA: Diagnosis not present

## 2016-01-20 DIAGNOSIS — Z823 Family history of stroke: Secondary | ICD-10-CM | POA: Insufficient documentation

## 2016-01-20 DIAGNOSIS — Z79899 Other long term (current) drug therapy: Secondary | ICD-10-CM | POA: Diagnosis not present

## 2016-01-20 DIAGNOSIS — Z8673 Personal history of transient ischemic attack (TIA), and cerebral infarction without residual deficits: Secondary | ICD-10-CM | POA: Insufficient documentation

## 2016-01-20 DIAGNOSIS — J029 Acute pharyngitis, unspecified: Secondary | ICD-10-CM

## 2016-01-20 DIAGNOSIS — Z8249 Family history of ischemic heart disease and other diseases of the circulatory system: Secondary | ICD-10-CM | POA: Diagnosis not present

## 2016-01-20 LAB — POCT RAPID STREP A: Streptococcus, Group A Screen (Direct): NEGATIVE

## 2016-01-20 NOTE — ED Provider Notes (Signed)
CSN: [id number]     Arrival date & time 01/20/16  1902 History   None    Chief Complaint  Patient presents with  . Sore Throat   (Consider location/radiation/quality/duration/timing/severity/associated sxs/prior Treatment) 45 year old female complaining of sore throat for 2 weeks. Started out as PND which causes discomfort in the back of the throat and then later developed into a more persistent type of burning pain in the back of the throat. It feels better when she drinks cold liquids. It also hurts with swallowing. Currently denies other upper respiratory symptoms.      Past Medical History:  Diagnosis Date  . CVA (cerebral infarction) 06/2014   ? mini strokes  . Depression    1996-currently untreated as did not like monotone emotions on treatment.   . Fallopian tube disorder    diagnostic laparascopy 1993 shoed left sidecd blocked tube. HSG in 2005 showed bilateral blockage. 2007 test HSG inconclusive.   Marland Kitchen GERD (gastroesophageal reflux disease)    since 2007treated with Zegrid 60mg  (omeprazole and sodium bicarb -atypical regimen  . 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   . Hypertension    2011  . Hypothyroid   . Migraines    2005   Past Surgical History:  Procedure Laterality Date  . HAND SURGERY Right 2006  . MYOMECTOMY  2014  . SALPINGECTOMY  1993   Family History  Problem Relation Age of Onset  . Diabetes type II Mother   . Hypertension Maternal Grandmother   . Hypertension Maternal Grandfather   . Diabetes type II      mom, sister, grandparents, aunts/uncles  . Hypertension      mom, brother, grandparents  . Hyperlipidemia      aunts/uncles  . Stroke      grandparents  . Breast cancer      aunt in 35s   Social History  Substance Use Topics  . Smoking status: Never Smoker  . Smokeless tobacco: Never Used  . Alcohol use 0.0 oz/week     Comment: rare   OB History    No data available      Review of Systems  Constitutional: Negative for chills and fever.  HENT: Positive for sore throat. Negative for congestion, ear discharge, ear pain, facial swelling, postnasal drip, rhinorrhea and trouble swallowing.   Eyes: Negative.   Respiratory: Negative.  Negative for cough and shortness of breath.   Gastrointestinal: Negative.   Genitourinary: Negative.   Musculoskeletal: Negative.   Neurological: Negative.   All other systems reviewed and are negative.   Allergies  Review of patient's allergies indicates no known allergies.  Home Medications   Prior to Admission medications   Medication Sig Start Date End Date Taking? Authorizing Provider  amLODipine (NORVASC) 10 MG tablet Take 10 mg by mouth daily.   Yes Historical Provider, MD  labetalol (NORMODYNE) 300 MG tablet Take 300 mg by mouth 2 (two) times daily.   Yes Historical Provider, MD  levothyroxine (SYNTHROID, LEVOTHROID) 150 MCG tablet Take 150 mcg by mouth daily. 05/14/14  Yes Historical Provider, MD  PROAIR HFA 108 (90 BASE) MCG/ACT inhaler USE 2 PUFFS 4 TIMES A DAY 07/15/14  Yes Historical Provider, MD  human chorionic gonadotropin (PREGNYL) 29562 units injection Inject 10,000 Units into the muscle once.    Historical Provider, MD  progesterone (PROMETRIUM) 200 MG capsule Take 200 mg by mouth 2 (two) times daily. 06/29/15   Historical Provider,  MD   Meds Ordered and Administered this Visit  Medications - No data to display  BP 164/97 (BP Location: Left Arm)   Pulse 85   Temp 98.5 F (36.9 C) (Oral)   Resp 16   LMP 01/07/2016   SpO2 97%  No data found.   Physical Exam  Constitutional: She is oriented to person, place, and time. She appears well-developed and well-nourished. No distress.  HENT:  Head: Normocephalic and atraumatic.  Mouth/Throat: No oropharyngeal exudate.  Bilateral TMs are normal.  Oropharynx with smooth  erythema as well as erythema and small red annular pointed lesions to the soft palate. No  exudates. No swelling.  Eyes: EOM are normal.  Neck: Normal range of motion. Neck supple.  Cardiovascular: Normal rate, regular rhythm and normal heart sounds.   Pulmonary/Chest: Effort normal and breath sounds normal. No respiratory distress.  Musculoskeletal: Normal range of motion. She exhibits no edema.  Lymphadenopathy:    She has no cervical adenopathy.  Neurological: She is alert and oriented to person, place, and time.  Skin: Skin is warm and dry. No rash noted.  Psychiatric: She has a normal mood and affect.  Nursing note and vitals reviewed.   Urgent Care Course   Clinical Course    Procedures (including critical care time)  Labs Review Labs Reviewed  POCT RAPID STREP A    Imaging Review No results found.   Visual Acuity Review  Right Eye Distance:   Left Eye Distance:   Bilateral Distance:    Right Eye Near:   Left Eye Near:    Bilateral Near:         MDM   1. Viral pharyngitis    Ibuprofen 600 mg every 6 hours as needed. May also take Tylenol every 4 hours as needed. Drink plenty of cool liquids and stay well-hydrated. Cepacol lozenges to help with sore throat pain.     Janne Napoleon, NP 01/20/16 2011

## 2016-01-20 NOTE — ED Triage Notes (Signed)
C/o ST onset 2 weeks associated w/dysphagia, dry throat  States it hurts when she yawns  Denies fevers, chills

## 2016-01-20 NOTE — Discharge Instructions (Signed)
Ibuprofen 600 mg every 6 hours as needed. May also take Tylenol every 4 hours as needed. Drink plenty of cool liquids and stay well-hydrated. Call lozenges to help a sore throat pain.

## 2016-01-20 NOTE — ED Notes (Signed)
April Dyer obtained Strep specimen, not Vanetta Shawl

## 2016-01-21 LAB — CULTURE, GROUP A STREP (THRC)

## 2016-02-25 ENCOUNTER — Encounter (HOSPITAL_COMMUNITY): Payer: Self-pay | Admitting: Emergency Medicine

## 2016-02-25 ENCOUNTER — Ambulatory Visit (HOSPITAL_COMMUNITY)
Admission: EM | Admit: 2016-02-25 | Discharge: 2016-02-25 | Disposition: A | Discharge status: Home / Self Care | Payer: 59 | Attending: Emergency Medicine | Admitting: Emergency Medicine

## 2016-02-25 ENCOUNTER — Ambulatory Visit (INDEPENDENT_AMBULATORY_CARE_PROVIDER_SITE_OTHER): Payer: 59

## 2016-02-25 DIAGNOSIS — M778 Other enthesopathies, not elsewhere classified: Secondary | ICD-10-CM

## 2016-02-25 MED ORDER — DICLOFENAC SODIUM 3 % TD GEL: TRANSDERMAL | 0 refills | Status: DC

## 2016-02-25 MED ORDER — TRAMADOL HCL 50 MG PO TABS
50.0000 mg | ORAL_TABLET | Freq: Four times a day (QID) | ORAL | 0 refills | Status: DC | PRN
Start: 2016-02-25 — End: 2016-10-14

## 2016-02-25 NOTE — ED Provider Notes (Signed)
[place]    CSN: [id number] Arrival date & time: 02/25/16  1540     History   Chief Complaint Chief Complaint  Patient presents with  . Wrist Injury    HPI April Dyer is a 45 y.o. female.   HPI She is a 45 year old woman here for evaluation of right wrist injury. On Thanksgiving, she caught herself with her right hand to prevent falling down the stairs. Since then, she has had pain and swelling in the dorsal and ulnar wrist. It is worse with wrist movements as well as finger movements. She has had persistent swelling over the wrist. She has been taking high-dose ibuprofen without improvement.  Past Medical History:  Diagnosis Date  . CVA (cerebral infarction) 06/2014   ? mini strokes  . Depression    1996-currently untreated as did not like monotone emotions on treatment.   . Fallopian tube disorder    diagnostic laparascopy 1993 shoed left sidecd blocked tube. HSG in 2005 showed bilateral blockage. 2007 test HSG inconclusive.   Marland Kitchen GERD (gastroesophageal reflux disease)    since 2007treated with Zegrid 60mg  (omeprazole and sodium bicarb -atypical regimen  . 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   . Hypertension    2011  . Hypothyroid   . Migraines    2005    Patient Active Problem List   Diagnosis Date Noted  . Vertigo   . Chronic diastolic heart failure ([place]) 07/06/2014  . Chest pain, rule out acute myocardial infarction   . Meralgia paresthetica of left side 07/05/2014  . Left leg numbness 07/05/2014  . Generalized anxiety disorder 07/05/2014  . Major depressive disorder, recurrent episode, severe ([place]) 07/05/2014  . Syncope 07/04/2014  . Slurred speech 12/26/2012  . Preventative health care 12/19/2011  . Neurodermatitis 12/17/2011  . Abdominal bloating 12/03/2011  . Fatigue 12/03/2011  . Breast lump on right side at 3 o'clock position 11/24/2011  . Fibroid, uterine  11/05/2011  . Infertility, female 11/05/2011  . 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 Rib pain 11/04/2011  . Low back pain 11/04/2011  . Obesity (BMI 30.0-34.9) 11/04/2011  . Hypertension   . GERD (gastroesophageal reflux disease)   . Migraines   . Fallopian tube disorder   . History of PID   . Depression     Past Surgical History:  Procedure Laterality Date  . HAND SURGERY Right 2006  . MYOMECTOMY  2014  . SALPINGECTOMY  1993    OB History    No data available       Home Medications    Prior to Admission medications   Medication Sig Start Date End Date Taking? Authorizing Provider  carvedilol (COREG) 25 MG tablet Take 25 mg by mouth 2 (two) times daily with a meal.   Yes Historical Provider, MD  cloNIDine (CATAPRES) 0.1 MG tablet Take 0.1 mg by mouth 2 (two) times daily.   Yes Historical Provider, MD  metoCLOPramide (REGLAN) 10 MG tablet Take 10 mg by mouth 4 (four) times daily.   Yes Historical Provider, MD  amLODipine (NORVASC) 10 MG tablet Take 10 mg by mouth daily.    Historical Provider, MD  Diclofenac Sodium 3 % GEL Apply 4 times a day for 1 week, then as needed. 02/25/16   Melony Overly, MD  human chorionic gonadotropin (PREGNYL) 60454 units injection Inject 10,000 Units  into the muscle once.    Historical Provider, MD  labetalol (NORMODYNE) 300 MG tablet Take 300 mg by mouth 2 (two) times daily.    Historical Provider, MD  levothyroxine (SYNTHROID, LEVOTHROID) 150 MCG tablet Take 150 mcg by mouth daily. 05/14/14   Historical Provider, MD  PROAIR HFA 108 (90 BASE) MCG/ACT inhaler USE 2 PUFFS 4 TIMES A DAY 07/15/14   Historical Provider, MD  progesterone (PROMETRIUM) 200 MG capsule Take 200 mg by mouth 2 (two) times daily. 06/29/15   Historical Provider, MD  traMADol (ULTRAM) 50 MG tablet Take 1 tablet (50 mg total) by mouth every 6 (six) hours as needed. 02/25/16   Melony Overly, MD    Family  History Family History  Problem Relation Age of Onset  . Diabetes type II Mother   . Hypertension Maternal Grandmother   . Hypertension Maternal Grandfather   . Diabetes type II      mom, sister, grandparents, aunts/uncles  . Hypertension      mom, brother, grandparents  . Hyperlipidemia      aunts/uncles  . Stroke      grandparents  . Breast cancer      aunt in 62s    Social History Social History  Substance Use Topics  . Smoking status: Never Smoker  . Smokeless tobacco: Never Used  . Alcohol use 0.0 oz/week     Comment: rare     Allergies   Patient has no known allergies.   Review of Systems Review of Systems As in history of present illness  Physical Exam Triage Vital Signs ED Triage Vitals [02/25/16 1656]  Enc Vitals Group     BP      Pulse      Resp      Temp      Temp src      SpO2      Weight      Height      Head Circumference      Peak Flow      Pain Score 7     Pain Loc      Pain Edu?      Excl. in [place]?    No data found.   Updated Vital Signs BP 173/95 (BP Location: Left Arm) Comment (BP Location): large cuff  Pulse 85   Temp 98.5 F (36.9 C) (Oral)   Resp 18   LMP 02/22/2016   SpO2 97%   Visual Acuity Right Eye Distance:   Left Eye Distance:   Bilateral Distance:    Right Eye Near:   Left Eye Near:    Bilateral Near:     Physical Exam  Constitutional: She is oriented to person, place, and time. She appears well-developed and well-nourished. No distress.  Cardiovascular: Normal rate.   Pulmonary/Chest: Effort normal.  Musculoskeletal:  Right wrist: No erythema. She does have edema over the distal forearm/proximal wrist. No point tenderness. Pain with active and passive wrist extension and flexion. Also has pain with finger movement. 2+ radial pulse.  Neurological: She is alert and oriented to person, place, and time.     UC Treatments / Results  Labs (all labs ordered are listed, but only abnormal results are  displayed) Labs Reviewed - No data to display  EKG  EKG Interpretation None       Radiology Dg Wrist Complete Right  Result Date: 02/25/2016 CLINICAL DATA:  Fall 2 weeks ago with right wrist injury. Right wrist pain and swelling. Initial encounter.  EXAM: RIGHT WRIST - COMPLETE 3+ VIEW COMPARISON:  None. FINDINGS: There is no evidence of fracture or dislocation. There is no evidence of arthropathy or other focal bone abnormality. Soft tissues are unremarkable. IMPRESSION: Negative. Electronically Signed   By: Earle Gell M.D.   On: 02/25/2016 17:22    Procedures Procedures (including critical care time)  Medications Ordered in UC Medications - No data to display   Initial Impression / Assessment and Plan / UC Course  I have reviewed the triage vital signs and the nursing notes.  Pertinent labs & imaging results that were available during my care of the patient were reviewed by me and considered in my medical decision making (see chart for details).  Clinical Course     Treatment with wrist splint. I instructed her to stop the ibuprofen. Use diclofenac gel 4 times a day. Tramadol as needed for pain. If no improvement in 3 days, follow up with her orthopedic doctor.  Final Clinical Impressions(s) / UC Diagnoses   Final diagnoses:  Right wrist tendonitis    New Prescriptions Discharge Medication List as of 02/25/2016  5:33 PM    START taking these medications   Details  Diclofenac Sodium 3 % GEL Apply 4 times a day for 1 week, then as needed., Normal         Melony Overly, MD 02/25/16 [phone]

## 2016-02-25 NOTE — Discharge Instructions (Signed)
You have really bad tendinitis in your wrist. Wear the wrist brace as much as you can for the next week. Apply diclofenac gel 4 times a day for the next week, then as needed. Avoid repetitive movements. If not starting to improve in 3 days, follow-up with your orthopedic doctor.

## 2016-02-25 NOTE — ED Triage Notes (Signed)
Patient says she fell into a wall.  Patient was avoiding falling on steps and put hand out against a wall to prevent fall.  Patient did not fall, but has had right wrist pain since occurrence.  This occurred Thanksgiving.  Patient can move fingers, brisk cap refill.  Patient does have pain with movement of fingers.  Patient unable to rotate forearm, too much pain.  Left radial pulse 2 plus.  Fingers do have periodic numbness.

## 2016-07-24 ENCOUNTER — Emergency Department (HOSPITAL_COMMUNITY): Payer: 59

## 2016-07-24 ENCOUNTER — Encounter (HOSPITAL_COMMUNITY): Payer: Self-pay | Admitting: *Deleted

## 2016-07-24 ENCOUNTER — Emergency Department (HOSPITAL_COMMUNITY)
Admission: EM | Admit: 2016-07-24 | Discharge: 2016-07-24 | Disposition: A | Discharge status: Home / Self Care | Payer: 59 | Attending: Emergency Medicine | Admitting: Emergency Medicine

## 2016-07-24 DIAGNOSIS — I5032 Chronic diastolic (congestive) heart failure: Secondary | ICD-10-CM | POA: Diagnosis not present

## 2016-07-24 DIAGNOSIS — Y999 Unspecified external cause status: Secondary | ICD-10-CM | POA: Insufficient documentation

## 2016-07-24 DIAGNOSIS — I110 Hypertensive heart disease with heart failure: Secondary | ICD-10-CM | POA: Insufficient documentation

## 2016-07-24 DIAGNOSIS — M545 Low back pain, unspecified: Secondary | ICD-10-CM

## 2016-07-24 DIAGNOSIS — Y92410 Unspecified street and highway as the place of occurrence of the external cause: Secondary | ICD-10-CM | POA: Insufficient documentation

## 2016-07-24 DIAGNOSIS — Z79899 Other long term (current) drug therapy: Secondary | ICD-10-CM | POA: Diagnosis not present

## 2016-07-24 DIAGNOSIS — S4992XA Unspecified injury of left shoulder and upper arm, initial encounter: Secondary | ICD-10-CM | POA: Diagnosis present

## 2016-07-24 DIAGNOSIS — M791 Myalgia, unspecified site: Secondary | ICD-10-CM

## 2016-07-24 DIAGNOSIS — Y939 Activity, unspecified: Secondary | ICD-10-CM | POA: Diagnosis not present

## 2016-07-24 DIAGNOSIS — R1012 Left upper quadrant pain: Secondary | ICD-10-CM | POA: Insufficient documentation

## 2016-07-24 DIAGNOSIS — Z8673 Personal history of transient ischemic attack (TIA), and cerebral infarction without residual deficits: Secondary | ICD-10-CM | POA: Insufficient documentation

## 2016-07-24 DIAGNOSIS — M25512 Pain in left shoulder: Secondary | ICD-10-CM

## 2016-07-24 LAB — I-STAT CHEM 8, ED
BUN: 15 mg/dL (ref 6–20)
CALCIUM ION: 1.15 mmol/L (ref 1.15–1.40)
CHLORIDE: 105 mmol/L (ref 101–111)
Creatinine, Ser: 1 mg/dL (ref 0.44–1.00)
Glucose, Bld: 86 mg/dL (ref 65–99)
HCT: 34 % — ABNORMAL LOW (ref 36.0–46.0)
Hemoglobin: 11.6 g/dL — ABNORMAL LOW (ref 12.0–15.0)
POTASSIUM: 3.6 mmol/L (ref 3.5–5.1)
SODIUM: 144 mmol/L (ref 135–145)
TCO2: 28 mmol/L (ref 0–100)

## 2016-07-24 LAB — I-STAT BETA HCG BLOOD, ED (MC, WL, AP ONLY)

## 2016-07-24 MED ORDER — MORPHINE SULFATE (PF) 4 MG/ML IV SOLN
4.0000 mg | Freq: Once | INTRAVENOUS | Status: AC
  Administered 2016-07-24: 4 mg via INTRAVENOUS
  Filled 2016-07-24: qty 1

## 2016-07-24 MED ORDER — IBUPROFEN 800 MG PO TABS: 800.0000 mg | ORAL_TABLET | Freq: Three times a day (TID) | ORAL | 0 refills | Status: DC | PRN

## 2016-07-24 MED ORDER — METHOCARBAMOL 500 MG PO TABS: 500.0000 mg | ORAL_TABLET | Freq: Two times a day (BID) | ORAL | 0 refills | Status: DC | PRN

## 2016-07-24 MED ORDER — HYDROCODONE-ACETAMINOPHEN 5-325 MG PO TABS: 1.0000 | ORAL_TABLET | Freq: Four times a day (QID) | ORAL | 0 refills | Status: DC | PRN

## 2016-07-24 MED ORDER — IOPAMIDOL (ISOVUE-300) INJECTION 61%
INTRAVENOUS | Status: AC
  Administered 2016-07-24: 100 mL
  Filled 2016-07-24: qty 100

## 2016-07-24 MED ORDER — DIAZEPAM 5 MG PO TABS
5.0000 mg | ORAL_TABLET | Freq: Once | ORAL | Status: AC
  Administered 2016-07-24: 5 mg via ORAL
  Filled 2016-07-24: qty 1
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
## 2016-07-24 NOTE — ED Notes (Signed)
Patient transported to X-ray 
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
## 2016-09-05 ENCOUNTER — Telehealth: Payer: Self-pay | Admitting: Diagnostic Neuroimaging

## 2016-09-05 ENCOUNTER — Telehealth: Payer: Self-pay

## 2016-09-05 NOTE — Telephone Encounter (Signed)
error 
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
## 2016-09-09 ENCOUNTER — Ambulatory Visit: Payer: BLUE CROSS/BLUE SHIELD | Admitting: Adult Health
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
## 2016-09-10 ENCOUNTER — Encounter: Payer: Self-pay | Admitting: Family Medicine

## 2016-09-10 ENCOUNTER — Ambulatory Visit (INDEPENDENT_AMBULATORY_CARE_PROVIDER_SITE_OTHER): Payer: 59 | Admitting: Family Medicine

## 2016-09-10 DIAGNOSIS — S060X0A Concussion without loss of consciousness, initial encounter: Secondary | ICD-10-CM

## 2016-09-10 DIAGNOSIS — S060XAA Concussion with loss of consciousness status unknown, initial encounter: Secondary | ICD-10-CM | POA: Insufficient documentation

## 2016-09-10 DIAGNOSIS — S060X9A Concussion with loss of consciousness of unspecified duration, initial encounter: Secondary | ICD-10-CM | POA: Insufficient documentation

## 2016-09-10 NOTE — Patient Instructions (Signed)
Good to see you I do think you have a concussion.  I would like you to limit screen time (including your phone) to 30 minutes daily   In addition to this I recommend......  To help improve COGNITIVE function: Using fish oil/omega 3 that is 1000 mg (or roughly 600 mg EPA/DHA), starting as soon as possible after concussion, take: 3 tabs TWICE DAILY  for the next 3 days, then 3 tabs ONCE DAILY  for the next 10 days   To help reduce HEADACHES: Coenzyme Q10 160mg  ONCE DAILY May stop after headaches are resolved.                                                                                             To help with INSOMNIA: Melatonin 3-5mg  AT BEDTIME     Other medicines to help decrease inflammation Turmeric 500mg  daily Iron 65mg  with 500mg  of vitamin C daily   I want to see you again in 6 days and we will get you back to work in some fashion.

## 2016-09-10 NOTE — Assessment & Plan Note (Addendum)
Mild seems ocular.  Discussed OTC meds.  Discussed Return to work.  Patient have underlying cervical DDD likley contributing and seeing another provider.  RTC in 6 days and will retest

## 2016-09-15 NOTE — Progress Notes (Signed)
Subjective:   I, April Dyer, am serving as a scribe for Dr. Hulan Saas.  Chief Complaint: April Dyer, DOB: [birth year], is a 46 y.o. female who presents for follow-up for concussion sustained on 07-24-2016.   She has been going to physical therapy for back and notes the positional changes bring about headache and floaters within her field of vision. She said that she is also still experiencing dizziness daily. She has not returned to work and has been trying to limit her screen time as much as possible. Overall, she notes that her headaches have improved though.  Memory Still most concerning. Patient is also having significant emotional problems as well. Chief Complaint  Patient presents with  . Head Injury    Injury date : 07/24/2016 Visit #: 2  History of Present Illness:   Concussion Self-Reported Symptom Score Symptoms rated on a scale 1-6, in last 24 hours  Headache: 4    Nausea: 0  Vomiting: 0  Balance Difficulty: 4   Dizziness: 0  Fatigue: 5  Trouble Falling Asleep: 2   Sleep More Than Usual: 0  Sleep Less Than Usual: 0  Daytime Drowsiness: 0  Photophobia: 1  Phonophobia: 0  Irritability: 4  Sadness: 3  Nervousness: 0  Feeling More Emotional: 5  Numbness or Tingling: 5  Feeling Slowed Down: 5  Feeling Mentally Foggy: 5  Difficulty Concentrating: 5  Difficulty Remembering: 5  Visual Problems: 4    Total Symptom Score: 58 Previous Symptom Score: 69  Review of Systems: Pertinent items are noted in HPI.  Review of History: Past Medical History:  Past Medical History:  Diagnosis Date  . CVA (cerebral infarction) 06/2014   ? mini strokes  . Depression    1996-currently untreated as did not like monotone emotions on treatment.   . Fallopian tube disorder    diagnostic laparascopy 1993 shoed left sidecd blocked tube. HSG in 2005 showed bilateral blockage. 2007 test HSG inconclusive.   Marland Kitchen GERD (gastroesophageal reflux disease)    since 2007treated with  Zegrid 68m (omeprazole and sodium bicarb -atypical regimen  . 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   . Hypertension    2011  . Hypothyroid   . Migraines    2005    Past Surgical History:  has a past surgical history that includes Myomectomy (2014); Salpingectomy (1993); and Hand surgery (Right, 2006). Family History: family history includes Diabetes type II in her mother; Hypertension in her maternal grandfather and maternal grandmother. Social History:  reports that she has never smoked. She has never used smokeless tobacco. She reports that she drinks alcohol. She reports that she does not use drugs. Current Medications: has a current medication list which includes the following prescription(s): amlodipine, carvedilol, clonidine, diclofenac sodium, human chorionic gonadotropin, hydrocodone-acetaminophen, ibuprofen, labetalol, levothyroxine, levothyroxine, losartan-hydrochlorothiazide, methocarbamol, metoclopramide, nebivolol hcl, multivitamin-prenatal, proair hfa, progesterone, and tramadol. Allergies: has No Known Allergies. No known drug allergies  Objective:    Physical Examination Vitals:   09/16/16 1303  BP: 118/78  Pulse: 80   General appearance: alert, appears stated age and cooperative Head: Normocephalic, without obvious abnormality,  Eyes: conjunctivae/corneas clear. PERRL, EOM's intactBut nystagmus noted. Fundi benign. Sclera anicteric.Tearful Lungs: clear to auscultation bilaterally and percussion Heart: regular rate and rhythm, S1, S2 normal, no murmur, click, rub or gallop Neurologic: CN 2-12 normal.  Sensation to pain, touch, and proprioception normal.  DTRs  normal in upper and lower extremities. No  pathologic reflexes. Positive rhomberg, modified rhomberg, but negative pronator drift, tandem gait, difficulty with finger-to-nose; see post-concussion vestibular and oculomotor testing in chart vestibular neuro  does have some difficulty with saccadic movemnts Psychiatric: Severe difficult he with short-term memory. Some mild difficulty with long-term memory per mother who is here.  Concussion testing performed today: Patient still has significant memory issues especially visual. Cognition efficiency index is potential for invalid test.  Neurocognitive testing (ImPACT):  Post #1 Post #2   Verbal Memory Composite 76(30%) 62 (5%)   Visual Memory Composite 49(10%) 61 (39%)   Visual Motor Speed Composite 19.58(5%) 18.35(5%)   Reaction Time Composite 1.66(<1%) 1.50 (1%)   Cognitive Efficiency Index .365 .05      Assessment:    Post-concussion headache - Plan: MR Angiogram Neck W Wo Contrast, Sed Rate (ESR), Vitamin D (25 hydroxy), IBC panel, TSH, Antinuclear Antib (ANA), Rheumatoid Factor, Cyclic citrul peptide antibody, IgG, CBC w/Diff, Calcium, ionized, PTH, Intact and Calcium, Angiotensin converting enzyme, MR Brain Wo Contrast, CANCELED: MR Brain W Wo Contrast  Madailein N Greene presents with the following concussion subtypes. []Cognitive []Cervical []Vestibular []Ocular []Migraine []Anxiety/Mood   Plan:   Action/Discussion: Reviewed diagnosis, management options, expected outcomes, and the reasons for scheduled and emergent follow-up. Questions were adequately answered. Patient expressed verbal understanding and agreement with the following plan.     

## 2016-09-16 ENCOUNTER — Ambulatory Visit (INDEPENDENT_AMBULATORY_CARE_PROVIDER_SITE_OTHER): Payer: 59 | Admitting: Family Medicine

## 2016-09-16 ENCOUNTER — Other Ambulatory Visit (INDEPENDENT_AMBULATORY_CARE_PROVIDER_SITE_OTHER): Payer: 59

## 2016-09-16 ENCOUNTER — Encounter: Payer: Self-pay | Admitting: Family Medicine

## 2016-09-16 VITALS — BP 118/78 | HR 80 | Ht 68.0 in | Wt 239.6 lb

## 2016-09-16 DIAGNOSIS — G44309 Post-traumatic headache, unspecified, not intractable: Secondary | ICD-10-CM

## 2016-09-16 DIAGNOSIS — S060X0A Concussion without loss of consciousness, initial encounter: Secondary | ICD-10-CM

## 2016-09-16 LAB — IBC PANEL
Iron: 91 ug/dL (ref 42–145)
SATURATION RATIOS: 27.3 % (ref 20.0–50.0)
Transferrin: 238 mg/dL (ref 212.0–360.0)

## 2016-09-16 LAB — CBC WITH DIFFERENTIAL/PLATELET
BASOS ABS: 0 10*3/uL (ref 0.0–0.1)
Basophils Relative: 0.4 % (ref 0.0–3.0)
Eosinophils Absolute: 0 10*3/uL (ref 0.0–0.7)
Eosinophils Relative: 0.5 % (ref 0.0–5.0)
HCT: 39.3 % (ref 36.0–46.0)
Hemoglobin: 13.4 g/dL (ref 12.0–15.0)
LYMPHS PCT: 24.2 % (ref 12.0–46.0)
Lymphs Abs: 2.3 10*3/uL (ref 0.7–4.0)
MCHC: 34.1 g/dL (ref 30.0–36.0)
MCV: 97.2 fl (ref 78.0–100.0)
MONOS PCT: 5.3 % (ref 3.0–12.0)
Monocytes Absolute: 0.5 10*3/uL (ref 0.1–1.0)
NEUTROS PCT: 69.6 % (ref 43.0–77.0)
Neutro Abs: 6.6 10*3/uL (ref 1.4–7.7)
Platelets: 248 10*3/uL (ref 150.0–400.0)
RBC: 4.04 Mil/uL (ref 3.87–5.11)
RDW: 15.3 % (ref 11.5–15.5)
WBC: 9.4 10*3/uL (ref 4.0–10.5)

## 2016-09-16 LAB — TSH: TSH: 2.13 u[IU]/mL (ref 0.35–4.50)

## 2016-09-16 LAB — SEDIMENTATION RATE: Sed Rate: 15 mm/hr (ref 0–20)

## 2016-09-16 LAB — VITAMIN D 25 HYDROXY (VIT D DEFICIENCY, FRACTURES): VITD: 28.38 ng/mL — ABNORMAL LOW (ref 30.00–100.00)

## 2016-09-16 NOTE — Patient Instructions (Signed)
Good to see you  I am sorry not better  Labs downstairs We are getting MRI of the neck and brain.  YOu should do well.  COntinue the vitamins See me again in 3 weeks.

## 2016-09-16 NOTE — Assessment & Plan Note (Signed)
Patient's symptoms over the course of time seems to be worsening at this time. Patient is to continue with the vitamin supplementation. With patient having some difficulty with memory that is fairly significant, patient also continued to have severe amount of headaches this seems to be increasing. Does have a history of migraines but this is different and patient states more severe. Patient did have a CT scan of the neck at the time of the injury but I do feel that further imaging is warranted at MRI brain with and without contrast for vascularity would be beneficial. We will make sure that there is no vascular couple is a could be contribute in. In addition of this we will continue with the MRI of the neck as well to rule out anything such as a vertebral dissection that could be contributing or any type of aneurysm. Patient's also will be evaluated for any type of Chiari malformation. Follow-up again after imaging.

## 2016-09-17 LAB — PTH, INTACT AND CALCIUM
CALCIUM: 9.9 mg/dL (ref 8.6–10.2)
PTH: 23 pg/mL (ref 14–64)

## 2016-09-17 LAB — RHEUMATOID FACTOR

## 2016-09-17 LAB — CALCIUM, IONIZED: Calcium, Ion: 5.2 mg/dL (ref 4.8–5.6)

## 2016-09-17 LAB — ANGIOTENSIN CONVERTING ENZYME: Angiotensin-Converting Enzyme: 26 U/L (ref 9–67)

## 2016-09-18 LAB — ANA: ANA: NEGATIVE

## 2016-09-18 LAB — CYCLIC CITRUL PEPTIDE ANTIBODY, IGG: Cyclic Citrullin Peptide Ab: <16 Units

## 2016-09-23 ENCOUNTER — Telehealth: Payer: Self-pay | Admitting: *Deleted

## 2016-09-23 NOTE — Telephone Encounter (Signed)
Pt came in stating that her Physical Therapist wanted her to check with Dr. Tamala Julian to see if she should postpone PT until she gets her MRI's. Pt stated that she has had increased nausea & vomiting. I called [facility] to try & move up pt's MRI but they had nothing sooner. I advised pt to stop PT at this time & if symptoms get worse to go to the ER. Otherwise we will wait for her to have her MRIs on July 13. Pt understood.

## 2016-10-04 ENCOUNTER — Ambulatory Visit
Admission: RE | Admit: 2016-10-04 | Discharge: 2016-10-04 | Disposition: A | Discharge status: Home / Self Care | Payer: 59 | Source: Ambulatory Visit | Attending: Family Medicine | Admitting: Family Medicine

## 2016-10-04 DIAGNOSIS — G44309 Post-traumatic headache, unspecified, not intractable: Secondary | ICD-10-CM

## 2016-10-07 ENCOUNTER — Telehealth: Payer: Self-pay | Admitting: Family Medicine

## 2016-10-07 NOTE — Telephone Encounter (Signed)
Pt would like to know if she needs to keep her appt with Tamala Julian, he referred her to have a Mri done of the Brain and neck, on that day she was too dehydrated and could only do the neck and she would ike to know if she needs to resch her appt until, she has the MRI of her neck.  Please advise

## 2016-10-08 NOTE — Telephone Encounter (Signed)
Spoke with patient. Advised her to keep her appointment per Dr. Tamala Julian. Will see patient tomorrow morning.

## 2016-10-08 NOTE — Progress Notes (Signed)
Subjective:   I, April Dyer, am serving as a scribe for Dr. Hulan Saas.  Chief Complaint: April Dyer, DOB: 07-Apr-1970, is a 46 y.o. female who presents for follow-up for concussion sustained on 07-24-2016.   Patient states that she has been getting worse. Two weeks ago she began vomiting and has since been throwing up at least once daily. She continues to feel nauseous, sees double of everything she looks at and develops headaches with riding in the car and looking at computer screens.   Injury date : 07/24/2016 Visit #: 3  History of Present Illness:   Concussion Self-Reported Symptom Score Symptoms rated on a scale 1-6, in last 24 hours  Headache: 4    Nausea: 5  Vomiting: 5  Balance Difficulty: 5   Dizziness: 5  Fatigue: 4  Trouble Falling Asleep: 6  Sleep More Than Usual: 2  Sleep Less Than Usual: 6  Daytime Drowsiness: 1  Photophobia: 4  Phonophobia: 0  Irritability: 4  Sadness: 3 Nervousness: 4  Feeling More Emotional: 5  Numbness or Tingling: 4  Feeling Slowed Down: 5  Feeling Mentally Foggy: 5  Difficulty Concentrating: 5  Difficulty Remembering: 5  Visual Problems: 5    Total Symptom Score: 101 Previous Symptom Score: 58  Review of Systems: Review of Systems: No  vomiting, diarrhea, constipation, dizziness, abdominal pain, skin rash, fevers, chills, night sweats, weight loss, swollen lymph nodes, body aches, joint swelling,chest pain, shortness of breath, mood changes.  Positive headaches, visual changes, mild nausea, muscle aches  Review of History: Past Medical History:  Past Medical History:  Diagnosis Date  . CVA (cerebral infarction) 06/2014   ? mini strokes  . Depression    1996-currently untreated as did not like monotone emotions on treatment.   . Fallopian tube disorder    diagnostic laparascopy 1993 shoed left sidecd blocked tube. HSG in 2005 showed bilateral blockage. 2007 test HSG inconclusive.   Marland Kitchen GERD (gastroesophageal reflux  disease)    since 2007treated with Zegrid 60mg  (omeprazole and sodium bicarb -atypical regimen  . 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   . Hypertension    2011  . Hypothyroid   . Migraines    2005    Past Surgical History:  has a past surgical history that includes Myomectomy (2014); Salpingectomy (1993); and Hand surgery (Right, 2006). Family History: family history includes Breast cancer in her unknown relative; Diabetes type II in her mother and unknown relative; Hyperlipidemia in her unknown relative; Hypertension in her maternal grandfather, maternal grandmother, and unknown relative; Stroke in her unknown relative. Social History:  reports that she has never smoked. She has never used smokeless tobacco. She reports that she drinks alcohol. She reports that she does not use drugs. Current Medications: has a current medication list which includes the following prescription(s): amlodipine, carvedilol, clonidine, diclofenac sodium, human chorionic gonadotropin, hydrocodone-acetaminophen, ibuprofen, labetalol, levothyroxine, levothyroxine, losartan-hydrochlorothiazide, methocarbamol, metoclopramide, nebivolol hcl, multivitamin-prenatal, proair hfa, progesterone, and tramadol. Allergies: has No Known Allergies. No known drug allergies  Objective:    Physical Examination Vitals:   10/09/16 0825  BP: (!) 132/98  Pulse: 64   General appearance: alert, appears stated age and cooperative Head: Normocephalic, without obvious abnormality,  Eyes: conjunctivae/corneas clear. PERRL, EOM's intact no nystagmus noted  Lungs: clear to auscultation bilaterally and percussion Heart: regular rate and rhythm, S1, S2 normal, no murmur, click, rub or gallop Neurologic: CN 2-12 normal.  Sensation to  pain, touch, and proprioception normal.  DTRs  normal in upper and lower extremities. No pathologic reflexes. Negative rhomberg, modified rhomberg,  but negative pronator drift, tandem gait, difficulty with finger-to-nose; significant improvement in vestibular testing Psychiatric: Continues to have difficulty with short-term memory  Concussion testing performed today: Patient still has significant memory issues especially visual. Cognition efficiency index is potential for invalid test.  Neurocognitive testing (ImPACT):  Post #1 Post #2   Verbal Memory Composite 76(30%) 62 (5%)   Visual Memory Composite 49(10%) 61 (39%)   Visual Motor Speed Composite 19.58(5%) 18.35(5%)   Reaction Time Composite 1.66(<1%) 1.50 (1%)   Cognitive Efficiency Index .365 .05      Assessment:    Intractable cluster headache syndrome, unspecified chronicity pattern - Plan: Ambulatory referral to Neurology  Concussion without loss of consciousness, initial encounter  Intractable persistent migraine aura without cerebral infarction and with status migrainosus  April Dyer presents with the following concussion subtypes. [] Cognitive [] Cervical [] Vestibular [x] Ocular but this has resolved at this time. [] Migraine [] Anxiety/Mood   Plan:   Action/Discussion: Reviewed diagnosis, management options, expected outcomes, and the reasons for scheduled and emergent follow-up. Questions were adequately answered. Patient expressed verbal understanding and agreement with the following plan.

## 2016-10-09 ENCOUNTER — Ambulatory Visit (INDEPENDENT_AMBULATORY_CARE_PROVIDER_SITE_OTHER): Payer: 59 | Admitting: Family Medicine

## 2016-10-09 ENCOUNTER — Telehealth: Payer: Self-pay | Admitting: Diagnostic Neuroimaging

## 2016-10-09 ENCOUNTER — Encounter: Payer: Self-pay | Admitting: Family Medicine

## 2016-10-09 VITALS — BP 132/98 | HR 64 | Ht 68.0 in | Wt 241.0 lb

## 2016-10-09 DIAGNOSIS — G44001 Cluster headache syndrome, unspecified, intractable: Secondary | ICD-10-CM | POA: Diagnosis not present

## 2016-10-09 DIAGNOSIS — G43511 Persistent migraine aura without cerebral infarction, intractable, with status migrainosus: Secondary | ICD-10-CM | POA: Diagnosis not present

## 2016-10-09 DIAGNOSIS — S060X0A Concussion without loss of consciousness, initial encounter: Secondary | ICD-10-CM | POA: Diagnosis not present

## 2016-10-09 NOTE — Telephone Encounter (Signed)
Noted  

## 2016-10-09 NOTE — Telephone Encounter (Signed)
Pt called said she was seen by Dr Creig Hines today and he referred to Dr Abbey Chatters to evaluate her migraines. She said he is booked into August and she wanted to be seen sooner, she is a pt of Dr Leta Baptist. She got a concussion in May and it has triggered her migraines. An appt was scheduled for 7/23 with Hoyle Sauer

## 2016-10-09 NOTE — Assessment & Plan Note (Signed)
Patient was initially seen for concussion. Patient is still having significant amount of symptomatology. All seems to be more secondary to more of her chronic migraines. We discussed with patient at great length. We discussed that I do not feel that any further imaging is necessary. I feel that at this point that she would need to see neurology for better results and possible evaluation of injury. Patient will be referred today. No changes in medication otherwise. Patient understands and is in agreement with the plan.

## 2016-10-09 NOTE — Patient Instructions (Signed)
Good to see you  I want you to see an eye doctor and see if anything has changed.  Conitnue the vitamin D  I think a lot of this is the migraines.  We will get you to see Dr. Tomi Likens for the headaches Send me a message in 2-3 weeks so I know you are moving in the right direction.  Good news is I think concussion is gone!

## 2016-10-14 ENCOUNTER — Ambulatory Visit (INDEPENDENT_AMBULATORY_CARE_PROVIDER_SITE_OTHER): Payer: 59 | Admitting: Nurse Practitioner

## 2016-10-14 ENCOUNTER — Telehealth: Payer: Self-pay | Admitting: Nurse Practitioner

## 2016-10-14 ENCOUNTER — Encounter: Payer: Self-pay | Admitting: Nurse Practitioner

## 2016-10-14 VITALS — BP 98/63 | HR 65 | Wt 241.4 lb

## 2016-10-14 DIAGNOSIS — S060X0A Concussion without loss of consciousness, initial encounter: Secondary | ICD-10-CM

## 2016-10-14 DIAGNOSIS — R51 Headache: Secondary | ICD-10-CM

## 2016-10-14 DIAGNOSIS — R519 Headache, unspecified: Secondary | ICD-10-CM | POA: Insufficient documentation

## 2016-10-14 DIAGNOSIS — G43511 Persistent migraine aura without cerebral infarction, intractable, with status migrainosus: Secondary | ICD-10-CM

## 2016-10-14 DIAGNOSIS — R0683 Snoring: Secondary | ICD-10-CM

## 2016-10-14 MED ORDER — TOPIRAMATE 50 MG PO TABS: 50.0000 mg | ORAL_TABLET | Freq: Every day | ORAL | 4 refills | Status: DC
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
## 2016-10-14 NOTE — Telephone Encounter (Signed)
She told me that she had stopped her physical therapy which is probably a good idea

## 2016-10-14 NOTE — Telephone Encounter (Signed)
Pt would like to know if she should still continue to stay our of PT till you inform her ? Please call or message on MyChart to let her know.

## 2016-10-14 NOTE — Patient Instructions (Addendum)
Begin Topamax 50 mg daily for headache Will get sleep study Recent MRI of the brain 10/14/2016 normal, no acute findings Follow-up in 4 months  Concussion, Adult A concussion is a brain injury from a direct hit (blow) to the head or body. This blow causes the brain to shake quickly back and forth inside the skull. This can damage brain cells and cause chemical changes in the brain. A concussion may also be known as a mild traumatic brain injury (TBI). Concussions are usually not life-threatening, but the effects of a concussion can be serious. If you have a concussion, you are more likely to experience concussion-like symptoms after a direct blow to the head in the future. What are the causes? This condition is caused by:  A direct blow to the head, such as from running into another player during a game, being hit in a fight, or hitting your head on a hard surface.  A jolt of the head or neck that causes the brain to move back and forth inside the skull, such as in a car crash.  What are the signs or symptoms? The signs of a concussion can be hard to notice. Early on, they may be missed by you, family members, and health care providers. You may look fine but act or feel differently. Symptoms are usually temporary, but they may last for days, weeks, or even longer. Some symptoms may appear right away but other symptoms may not show up for hours or days. Every head injury is different. Symptoms may include:  Headaches. This can include a feeling of pressure in the head.  Memory problems.  Trouble concentrating, organizing, or making decisions.  Slowness in thinking, acting or reacting, speaking, or reading.  Confusion.  Fatigue.  Changes in eating or sleeping patterns.  Problems with coordination or balance.  Nausea or vomiting.  Numbness or tingling.  Sensitivity to light or noise.  Vision or hearing problems.  Reduced sense of smell.  Irritability or mood  changes.  Dizziness.  Lack of motivation.  Seeing or hearing things that other people do not see or hear (hallucinations).  How is this diagnosed? This condition is diagnosed based on:  Your symptoms.  A description of your injury.  You may also have tests, including:  Imaging tests, such as a CT scan or MRI. These are done to look for signs of brain injury.  Neuropsychological tests. These measure your thinking, understanding, learning, and remembering abilities.  How is this treated? This condition is treated with physical and mental rest and careful observation, usually at home. If the concussion is severe, you may need to stay home from work for a while. You may be referred to a concussion clinic or to other health care providers for management. It is important that you tell your health care provider if:  You are taking any medicines, including prescription medicines, over-the-counter medicines, and natural remedies. Some medicines, such as blood thinners (anticoagulants) and aspirin, may increase the chance of complications, such as bleeding.  You are taking or have taken alcohol or illegal drugs. Alcohol and certain other drugs may slow your recovery and can put you at risk of further injury.  How fast you will recover from a concussion depends on many factors, such as how severe your concussion is, what part of your brain was injured, how old you are, and how healthy you were before the concussion. Recovery can take time. It is important to wait to return to activity until a  health care provider says it is safe to do that and your symptoms are completely gone. Follow these instructions at home: Activity  Limit activities that require a lot of thought or concentration. These may include: ? Doing homework or job-related work. ? Watching TV. ? Working on the computer. ? Playing memory games and puzzles.  Rest. Rest helps the brain to heal. Make sure you: ? Get plenty of  sleep at night. Avoid staying up late at night. ? Keep the same bedtime hours on weekends and weekdays. ? Rest during the day. Take naps or rest breaks when you feel tired.  Having another concussion before the first one has healed can be dangerous. Do not do high-risk activities that could cause a second concussion, such as riding a bicycle or playing sports.  Ask your health care provider when you can return to your normal activities, such as school, work, athletics, driving, riding a bicycle, or using heavy machinery. Your ability to react may be slower after a brain injury. Never do these activities if you are dizzy. Your health care provider will likely give you a plan for gradually returning to activities. General instructions  Take over-the-counter and prescription medicines only as told by your health care provider.  Do not drink alcohol until your health care provider says you can.  If it is harder than usual to remember things, write them down.  If you are easily distracted, try to do one thing at a time. For example, do not try to watch TV while fixing dinner.  Talk with family members or close friends when making important decisions.  Watch your symptoms and tell others to do the same. Complications sometimes occur after a concussion. Older adults with a brain injury may have a higher risk of serious complications, such as a blood clot in the brain.  Tell your teachers, school nurse, school counselor, coach, athletic trainer, or work Freight forwarder about your injury, symptoms, and restrictions. Tell them about what you can or cannot do. They should watch for: ? Increased problems with attention or concentration. ? Increased difficulty remembering or learning new information. ? Increased time needed to complete tasks or assignments. ? Increased irritability or decreased ability to cope with stress. ? Increased symptoms.  Keep all follow-up visits as told by your health care provider.  This is important. How is this prevented? It is very important to avoid another brain injury, especially as you recover. In rare cases, another injury can lead to permanent brain damage, brain swelling, or death. The risk of this is greatest during the first 7-10 days after a head injury. Avoid injuries by:  Wearing a seat belt when riding in a car.  Wearing a helmet when biking, skiing, skateboarding, skating, or doing similar activities.  Avoiding activities that could lead to a second concussion, such as contact or recreational sports, until your health care provider says it is okay.  Taking safety measures in your home, such as: ? Removing clutter and tripping hazards from floors and stairways. ? Using grab bars in bathrooms and handrails by stairs. ? Placing non-slip mats on floors and in bathtubs. ? Improving lighting in dim areas.  Contact a health care provider if:  Your symptoms get worse.  You have new symptoms.  You continue to have symptoms for more than 2 weeks. Get help right away if:  You have severe or worsening headaches.  You have weakness or numbness in any part of your body.  Your coordination gets  worse.  You vomit repeatedly.  You are sleepier.  The pupil of one eye is larger than the other.  You have convulsions or a seizure.  Your speech is slurred.  Your fatigue, confusion, or irritability gets worse.  You cannot recognize people or places.  You have neck pain.  It is difficult to wake you up.  You have unusual behavior changes.  You lose consciousness. Summary  A concussion is a brain injury from a direct hit (blow) to the head or body.  A concussion may also be called a mild traumatic brain injury (TBI).  You may have imaging tests and neuropsychological tests to diagnose a concussion.  This condition is treated with physical and mental rest and careful observation.  Ask your health care provider when you can return to your  normal activities, such as school, work, athletics, driving, riding a bicycle, or using heavy machinery. Follow safety instructions as told by your health care provider. This information is not intended to replace advice given to you by your health care provider. Make sure you discuss any questions you have with your health care provider. Document Released: 06/01/2003 Document Revised: 02/20/2016 Document Reviewed: 02/20/2016 Elsevier Interactive Patient Education  2017 [place], Adult A concussion is a brain injury from a direct hit (blow) to the head or body. This blow causes the brain to shake quickly back and forth inside the skull. This can damage brain cells and cause chemical changes in the brain. A concussion may also be known as a mild traumatic brain injury (TBI). Concussions are usually not life-threatening, but the effects of a concussion can be serious. If you have a concussion, you are more likely to experience concussion-like symptoms after a direct blow to the head in the future. What are the causes? This condition is caused by:  A direct blow to the head, such as from running into another player during a game, being hit in a fight, or hitting your head on a hard surface.  A jolt of the head or neck that causes the brain to move back and forth inside the skull, such as in a car crash.  What are the signs or symptoms? The signs of a concussion can be hard to notice. Early on, they may be missed by you, family members, and health care providers. You may look fine but act or feel differently. Symptoms are usually temporary, but they may last for days, weeks, or even longer. Some symptoms may appear right away but other symptoms may not show up for hours or days. Every head injury is different. Symptoms may include:  Headaches. This can include a feeling of pressure in the head.  Memory problems.  Trouble concentrating, organizing, or making decisions.  Slowness  in thinking, acting or reacting, speaking, or reading.  Confusion.  Fatigue.  Changes in eating or sleeping patterns.  Problems with coordination or balance.  Nausea or vomiting.  Numbness or tingling.  Sensitivity to light or noise.  Vision or hearing problems.  Reduced sense of smell.  Irritability or mood changes.  Dizziness.  Lack of motivation.  Seeing or hearing things that other people do not see or hear (hallucinations).  How is this diagnosed? This condition is diagnosed based on:  Your symptoms.  A description of your injury.  You may also have tests, including:  Imaging tests, such as a CT scan or MRI. These are done to look for signs of brain injury.  Neuropsychological tests. These  measure your thinking, understanding, learning, and remembering abilities.  How is this treated? This condition is treated with physical and mental rest and careful observation, usually at home. If the concussion is severe, you may need to stay home from work for a while. You may be referred to a concussion clinic or to other health care providers for management. It is important that you tell your health care provider if:  You are taking any medicines, including prescription medicines, over-the-counter medicines, and natural remedies. Some medicines, such as blood thinners (anticoagulants) and aspirin, may increase the chance of complications, such as bleeding.  You are taking or have taken alcohol or illegal drugs. Alcohol and certain other drugs may slow your recovery and can put you at risk of further injury.  How fast you will recover from a concussion depends on many factors, such as how severe your concussion is, what part of your brain was injured, how old you are, and how healthy you were before the concussion. Recovery can take time. It is important to wait to return to activity until a health care provider says it is safe to do that and your symptoms are completely  gone. Follow these instructions at home: Activity  Limit activities that require a lot of thought or concentration. These may include: ? Doing homework or job-related work. ? Watching TV. ? Working on the computer. ? Playing memory games and puzzles.  Rest. Rest helps the brain to heal. Make sure you: ? Get plenty of sleep at night. Avoid staying up late at night. ? Keep the same bedtime hours on weekends and weekdays. ? Rest during the day. Take naps or rest breaks when you feel tired.  Having another concussion before the first one has healed can be dangerous. Do not do high-risk activities that could cause a second concussion, such as riding a bicycle or playing sports.  Ask your health care provider when you can return to your normal activities, such as school, work, athletics, driving, riding a bicycle, or using heavy machinery. Your ability to react may be slower after a brain injury. Never do these activities if you are dizzy. Your health care provider will likely give you a plan for gradually returning to activities. General instructions  Take over-the-counter and prescription medicines only as told by your health care provider.  Do not drink alcohol until your health care provider says you can.  If it is harder than usual to remember things, write them down.  If you are easily distracted, try to do one thing at a time. For example, do not try to watch TV while fixing dinner.  Talk with family members or close friends when making important decisions.  Watch your symptoms and tell others to do the same. Complications sometimes occur after a concussion. Older adults with a brain injury may have a higher risk of serious complications, such as a blood clot in the brain.  Tell your teachers, school nurse, school counselor, coach, athletic trainer, or work Freight forwarder about your injury, symptoms, and restrictions. Tell them about what you can or cannot do. They should watch  for: ? Increased problems with attention or concentration. ? Increased difficulty remembering or learning new information. ? Increased time needed to complete tasks or assignments. ? Increased irritability or decreased ability to cope with stress. ? Increased symptoms.  Keep all follow-up visits as told by your health care provider. This is important. How is this prevented? It is very important to avoid another brain  injury, especially as you recover. In rare cases, another injury can lead to permanent brain damage, brain swelling, or death. The risk of this is greatest during the first 7-10 days after a head injury. Avoid injuries by:  Wearing a seat belt when riding in a car.  Wearing a helmet when biking, skiing, skateboarding, skating, or doing similar activities.  Avoiding activities that could lead to a second concussion, such as contact or recreational sports, until your health care provider says it is okay.  Taking safety measures in your home, such as: ? Removing clutter and tripping hazards from floors and stairways. ? Using grab bars in bathrooms and handrails by stairs. ? Placing non-slip mats on floors and in bathtubs. ? Improving lighting in dim areas.  Contact a health care provider if:  Your symptoms get worse.  You have new symptoms.  You continue to have symptoms for more than 2 weeks. Get help right away if:  You have severe or worsening headaches.  You have weakness or numbness in any part of your body.  Your coordination gets worse.  You vomit repeatedly.  You are sleepier.  The pupil of one eye is larger than the other.  You have convulsions or a seizure.  Your speech is slurred.  Your fatigue, confusion, or irritability gets worse.  You cannot recognize people or places.  You have neck pain.  It is difficult to wake you up.  You have unusual behavior changes.  You lose consciousness. Summary  A concussion is a brain injury from a  direct hit (blow) to the head or body.  A concussion may also be called a mild traumatic brain injury (TBI).  You may have imaging tests and neuropsychological tests to diagnose a concussion.  This condition is treated with physical and mental rest and careful observation.  Ask your health care provider when you can return to your normal activities, such as school, work, athletics, driving, riding a bicycle, or using heavy machinery. Follow safety instructions as told by your health care provider. This information is not intended to replace advice given to you by your health care provider. Make sure you discuss any questions you have with your health care provider. Document Released: 06/01/2003 Document Revised: 02/20/2016 Document Reviewed: 02/20/2016 Elsevier Interactive Patient Education  2017 [employer].

## 2016-10-15 ENCOUNTER — Encounter: Payer: Self-pay | Admitting: *Deleted

## 2016-10-15 NOTE — Telephone Encounter (Signed)
LVM advising this RN will send a my chart message.  My chart message sent advising patient NP recommends she not resume PT.

## 2016-10-15 NOTE — Progress Notes (Signed)
I reviewed note and agree with plan.   Penni Bombard, MD 5/72/6203, 5:59 PM Certified in Neurology, Neurophysiology and Neuroimaging  [facility] [address], [place], [place] [address] [phone]

## 2016-10-23 ENCOUNTER — Other Ambulatory Visit: Payer: Self-pay

## 2016-11-07 ENCOUNTER — Encounter: Payer: Self-pay | Admitting: Neurology

## 2016-11-07 ENCOUNTER — Ambulatory Visit (INDEPENDENT_AMBULATORY_CARE_PROVIDER_SITE_OTHER): Payer: 59 | Admitting: Neurology

## 2016-11-07 VITALS — BP 123/82 | HR 61 | Ht 68.0 in | Wt 244.0 lb

## 2016-11-07 DIAGNOSIS — E669 Obesity, unspecified: Secondary | ICD-10-CM | POA: Diagnosis not present

## 2016-11-07 DIAGNOSIS — R51 Headache: Secondary | ICD-10-CM | POA: Diagnosis not present

## 2016-11-07 DIAGNOSIS — Z72820 Sleep deprivation: Secondary | ICD-10-CM

## 2016-11-07 DIAGNOSIS — R0683 Snoring: Secondary | ICD-10-CM

## 2016-11-07 DIAGNOSIS — Z820 Family history of epilepsy and other diseases of the nervous system: Secondary | ICD-10-CM | POA: Diagnosis not present

## 2016-11-07 DIAGNOSIS — R351 Nocturia: Secondary | ICD-10-CM

## 2016-11-07 DIAGNOSIS — R519 Headache, unspecified: Secondary | ICD-10-CM
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
## 2016-11-07 NOTE — Patient Instructions (Signed)
Based on your symptoms and your exam I believe you are at risk for obstructive sleep apnea or OSA, and I think we should proceed with a sleep study to determine whether you do or do not have OSA and how severe it is. If you have more than mild OSA, I want you to consider treatment with CPAP. Please remember, the risks and ramifications of moderate to severe obstructive sleep apnea or OSA are: Cardiovascular disease, including congestive heart failure, stroke, difficult to control hypertension, arrhythmias, and even type 2 diabetes has been linked to untreated OSA. Sleep apnea causes disruption of sleep and sleep deprivation in most cases, which, in turn, can cause recurrent headaches, problems with memory, mood, concentration, focus, and vigilance. Most people with untreated sleep apnea report excessive daytime sleepiness, which can affect their ability to drive. Please do not drive if you feel sleepy.   I will likely see you back after your sleep study to go over the test results and where to go from there. We will call you after your sleep study to advise about the results (most likely, you will hear from Diana, my nurse) and to set up an appointment at the time, as necessary.    Our sleep lab administrative assistant, Dawn will meet with you or call you to schedule your sleep study. If you don't hear back from her by about 2 weeks, please feel free to call her at [phone]. This is her direct line and please leave a message with your phone number to call back if you get the voicemail box. She will call back as soon as possible.   

## 2016-11-19 ENCOUNTER — Telehealth: Payer: Self-pay | Admitting: Family Medicine

## 2016-11-19 NOTE — Telephone Encounter (Signed)
Pt called stating her PT believes she should start Vestibular therapy for her concussion and asked if we do that.  Please call back

## 2016-11-20 ENCOUNTER — Other Ambulatory Visit: Payer: Self-pay

## 2016-11-20 DIAGNOSIS — G44309 Post-traumatic headache, unspecified, not intractable: Secondary | ICD-10-CM

## 2016-11-20 NOTE — Telephone Encounter (Signed)
Left message for patient to call back to discuss referral 

## 2016-11-20 NOTE — Telephone Encounter (Signed)
Spoke with patient and I will place referral to vestibular therapy for her. Patient understands that PT will be calling her to schedule.

## 2016-11-20 NOTE — Progress Notes (Unsigned)
amb  

## 2016-11-26 ENCOUNTER — Ambulatory Visit: Payer: 59

## 2016-11-29 ENCOUNTER — Ambulatory Visit (INDEPENDENT_AMBULATORY_CARE_PROVIDER_SITE_OTHER): Payer: 59 | Admitting: Physical Therapy

## 2016-11-29 DIAGNOSIS — R2689 Other abnormalities of gait and mobility: Secondary | ICD-10-CM | POA: Diagnosis not present

## 2016-11-29 DIAGNOSIS — R42 Dizziness and giddiness: Secondary | ICD-10-CM

## 2016-11-29 DIAGNOSIS — R2681 Unsteadiness on feet: Secondary | ICD-10-CM

## 2016-11-29 DIAGNOSIS — H8112 Benign paroxysmal vertigo, left ear: Secondary | ICD-10-CM
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
## 2016-12-05 ENCOUNTER — Other Ambulatory Visit: Payer: Self-pay | Admitting: Internal Medicine

## 2016-12-05 DIAGNOSIS — M79604 Pain in right leg: Secondary | ICD-10-CM

## 2016-12-05 DIAGNOSIS — R609 Edema, unspecified: Secondary | ICD-10-CM

## 2016-12-05 DIAGNOSIS — M79605 Pain in left leg: Principal | ICD-10-CM

## 2016-12-06 ENCOUNTER — Telehealth: Payer: Self-pay | Admitting: Physical Therapy

## 2016-12-06 NOTE — Telephone Encounter (Signed)
Spoke with pt re: no show for appt.  Pt reports N/V/D since Tuesday and continues to be sick.  Advised pt to call if unable to make appt and reminded her of next appt.  Pt verbalized understanding.  Laureen Abrahams, PT, DPT 12/06/16 9:06 AM

## 2016-12-09 ENCOUNTER — Telehealth: Payer: Self-pay | Admitting: Neurology

## 2016-12-09 DIAGNOSIS — R51 Headache: Principal | ICD-10-CM

## 2016-12-09 DIAGNOSIS — R519 Headache, unspecified: Secondary | ICD-10-CM

## 2016-12-09 NOTE — Telephone Encounter (Signed)
[employer] denied Split sleep study and approved a HST.  Can I get an order for HST?

## 2016-12-13 ENCOUNTER — Ambulatory Visit (INDEPENDENT_AMBULATORY_CARE_PROVIDER_SITE_OTHER): Payer: 59 | Admitting: Physical Therapy

## 2016-12-13 DIAGNOSIS — R2681 Unsteadiness on feet: Secondary | ICD-10-CM

## 2016-12-13 DIAGNOSIS — R42 Dizziness and giddiness: Secondary | ICD-10-CM

## 2016-12-13 DIAGNOSIS — H8112 Benign paroxysmal vertigo, left ear: Secondary | ICD-10-CM

## 2016-12-13 DIAGNOSIS — R2689 Other abnormalities of gait and mobility: Secondary | ICD-10-CM

## 2016-12-13 NOTE — Patient Instructions (Signed)
Gaze Stabilization: Sitting    Keeping eyes on target on wall 3-5 feet away, and move head side to side for _30-60___ seconds. Repeat while moving head up and down for __30-60__ seconds. Do __2-3__ sessions per day.  Copyright  VHI. All rights reserved.   Gaze Stabilization: Tip Card  1.Target must remain in focus, not blurry, and appear stationary while head is in motion. 2.Perform exercises with small head movements (45 to either side of midline). 3.Increase speed of head motion so long as target is in focus. 4.If you wear eyeglasses, be sure you can see target through lens (therapist will give specific instructions for bifocal / progressive lenses). 5.These exercises may provoke dizziness or nausea. Work through these symptoms. If too dizzy, slow head movement slightly. Rest between each exercise. 6.Exercises demand concentration; avoid distractions.  Copyright  VHI. All rights reserved.
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
## 2016-12-19 ENCOUNTER — Ambulatory Visit
Admission: RE | Admit: 2016-12-19 | Discharge: 2016-12-19 | Disposition: A | Discharge status: Home / Self Care | Payer: 59 | Source: Ambulatory Visit | Attending: Internal Medicine | Admitting: Internal Medicine

## 2016-12-19 DIAGNOSIS — M79604 Pain in right leg: Secondary | ICD-10-CM

## 2016-12-19 DIAGNOSIS — M79605 Pain in left leg: Principal | ICD-10-CM

## 2016-12-19 DIAGNOSIS — R609 Edema, unspecified: Secondary | ICD-10-CM

## 2016-12-20 ENCOUNTER — Ambulatory Visit (INDEPENDENT_AMBULATORY_CARE_PROVIDER_SITE_OTHER): Payer: 59 | Admitting: Physical Therapy

## 2016-12-20 DIAGNOSIS — R2689 Other abnormalities of gait and mobility: Secondary | ICD-10-CM

## 2016-12-20 DIAGNOSIS — H8112 Benign paroxysmal vertigo, left ear: Secondary | ICD-10-CM

## 2016-12-20 DIAGNOSIS — R42 Dizziness and giddiness: Secondary | ICD-10-CM | POA: Diagnosis not present

## 2016-12-20 DIAGNOSIS — R2681 Unsteadiness on feet: Secondary | ICD-10-CM | POA: Diagnosis not present

## 2016-12-20 NOTE — Patient Instructions (Signed)
Feet Together (Compliant Surface) Varied Arm Positions - Eyes Closed    Stand on compliant surface: _pillow or cushion____ with feet together and arms at your side. Close eyes and try to stand still. Hold___10-15_ seconds. Repeat _3-5___ times per session. Do _1-2___ sessions per day.   Feet Together (Compliant Surface) Head Motion - Eyes Open    With eyes open, standing on compliant surface: __pillow or cushion______, feet together, move head slowly: up and down 10 times each way and side to side 10 times each way. Repeat __1-2__ times per session. Do _1-2___ sessions per day.   Feet Apart (Compliant Surface) Head Motion - Eyes Closed    Stand on compliant surface: _pillow or cushion___ with feet shoulder width apart. Close eyes and move head slowly, up and down 10 times each way; then side to side 10 times each way. Repeat __1-2__ times per session. Do _1-2___ sessions per day.
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
## 2016-12-23 ENCOUNTER — Ambulatory Visit (INDEPENDENT_AMBULATORY_CARE_PROVIDER_SITE_OTHER): Payer: 59 | Admitting: Neurology

## 2016-12-23 DIAGNOSIS — R519 Headache, unspecified: Secondary | ICD-10-CM

## 2016-12-23 DIAGNOSIS — G4710 Hypersomnia, unspecified: Secondary | ICD-10-CM | POA: Diagnosis not present

## 2016-12-23 DIAGNOSIS — R0683 Snoring: Secondary | ICD-10-CM

## 2016-12-23 DIAGNOSIS — R51 Headache: Secondary | ICD-10-CM

## 2016-12-26 NOTE — Progress Notes (Signed)
Patient referred by CM, seen by me on 11/07/16, HST on 12/23/16.   Please call and notify the patient that the recent home sleep test did not show any significant obstructive sleep apnea. Patient can follow up with the referring provider(s).  Once you have spoken to patient, you can close this encounter.   Thanks,  Star Age, MD, PhD [facility])

## 2016-12-26 NOTE — Procedures (Signed)
[facility] [place], [place] [address] NAME: April Dyer DOB: 06-Jul-1970 MEDICAL RECORD [id number] DOS: 12/23/16 REFERRING PHYSICIAN: Cecille Rubin, NP;   Andrey Spearman, MD STUDY PERFORMED: Home Sleep Study HISTORY: 46 year old woman with a history of migraines, hypertension, hypothyroidism, reflux disease, and obesity, who reports snoring and morning headaches. Her Epworth sleepiness score is 8 out of 24. STUDY RESULTS: Total Recording:    9 Hours, 15 Minutes Total Apnea/Hypopnea Index (AHI):    0.8/Hour Average Oxygen Saturation:     94% Lowest Oxygen Saturation:       87%  Time Oxygen Saturation Below 88%:   0 min  Snoring events: 321 Average Heart Rate:     82 BPM IMPRESSION: Snoring RECOMMENDATION: This home sleep test does not demonstrate any significant obstructive or central sleep disordered breathing, except for evidence of snoring. Other causes of the patient's symptoms, including circadian rhythm disturbances, an underlying mood disorder, medication effect and/or an underlying medical problem cannot be ruled out based on this test. Clinical correlation is recommended. The patient should be cautioned not to drive, work at heights, or operate dangerous or heavy equipment when tired or sleepy. Review and reiteration of good sleep hygiene measures should be pursued with any patient. The patient can follow up with her referring provider, who will be notified of the test results.  I certify that I have reviewed the raw data recording prior to the issuance of this report in accordance with the standards of the American Academy of Sleep Medicine (AASM). [facility] ([facility] Diplomat, ABPN (Neurology and Sleep)

## 2016-12-27 ENCOUNTER — Ambulatory Visit (INDEPENDENT_AMBULATORY_CARE_PROVIDER_SITE_OTHER): Payer: 59 | Admitting: Physical Therapy

## 2016-12-27 DIAGNOSIS — H8112 Benign paroxysmal vertigo, left ear: Secondary | ICD-10-CM

## 2016-12-27 DIAGNOSIS — R2689 Other abnormalities of gait and mobility: Secondary | ICD-10-CM

## 2016-12-27 DIAGNOSIS — R42 Dizziness and giddiness: Secondary | ICD-10-CM | POA: Diagnosis not present

## 2016-12-27 DIAGNOSIS — R2681 Unsteadiness on feet: Secondary | ICD-10-CM | POA: Diagnosis not present
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
## 2016-12-30 ENCOUNTER — Telehealth: Payer: Self-pay

## 2016-12-30 NOTE — Telephone Encounter (Signed)
I called pt to discuss her sleep study results. No answer, left a message asking her to call me back. 

## 2016-12-30 NOTE — Telephone Encounter (Signed)
-----   Message from Star Age, MD sent at 12/26/2016  6:15 PM EDT ----- Patient referred by CM, seen by me on 11/07/16, HST on 12/23/16.   Please call and notify the patient that the recent home sleep test did not show any significant obstructive sleep apnea. Patient can follow up with the referring provider(s).  Once you have spoken to patient, you can close this encounter.   Thanks,  Star Age, MD, PhD [facility])

## 2017-01-02 NOTE — Telephone Encounter (Signed)
I called pt. I advised her that her sleep study did not show any significant osa and pt may follow up with her referring providers. Pt asked me to cancel her appt with Hoyle Sauer, NP in November because she is not having migraines and is working with vestibular rehab. Appt cancelled. Pt verbalized understanding of results. Pt had no questions at this time but was encouraged to call back if questions arise.

## 2017-01-23 ENCOUNTER — Ambulatory Visit (INDEPENDENT_AMBULATORY_CARE_PROVIDER_SITE_OTHER): Payer: 59

## 2017-01-23 ENCOUNTER — Encounter (HOSPITAL_COMMUNITY): Payer: Self-pay | Admitting: Family Medicine

## 2017-01-23 ENCOUNTER — Ambulatory Visit (HOSPITAL_COMMUNITY)
Admission: EM | Admit: 2017-01-23 | Discharge: 2017-01-23 | Disposition: A | Discharge status: Home / Self Care | Payer: 59 | Attending: Emergency Medicine | Admitting: Emergency Medicine

## 2017-01-23 DIAGNOSIS — S63501A Unspecified sprain of right wrist, initial encounter: Secondary | ICD-10-CM

## 2017-01-23 DIAGNOSIS — M25531 Pain in right wrist: Secondary | ICD-10-CM | POA: Diagnosis not present

## 2017-01-23 NOTE — ED Triage Notes (Signed)
Pt here for right wrist pain due to injury 1 month ago. Reports it is not getting better.
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
## 2017-01-23 NOTE — Discharge Instructions (Signed)
May continue with ACE wrap as needed. Ice application may be helpful as well. Exercises have been provided to continue to improve range of motion. You are already taking an antiinflammatory which should help. Continue to follow with your Primary care provider or orthopedist as needed

## 2017-01-24 DIAGNOSIS — M79671 Pain in right foot: Secondary | ICD-10-CM | POA: Insufficient documentation

## 2017-01-24 DIAGNOSIS — M2011 Hallux valgus (acquired), right foot: Secondary | ICD-10-CM | POA: Insufficient documentation

## 2017-01-24 DIAGNOSIS — M2021 Hallux rigidus, right foot: Secondary | ICD-10-CM | POA: Insufficient documentation

## 2017-01-24 IMAGING — DX DG WRIST COMPLETE 3+V*R*
4 series · 4 of 4 positions shown · non-contrast
Comparison: None.

CLINICAL DATA: Fall 2 weeks ago with right wrist injury. Right
wrist pain and swelling. Initial encounter.

EXAM:
RIGHT WRIST - COMPLETE 3+ VIEW

[wrist pa]
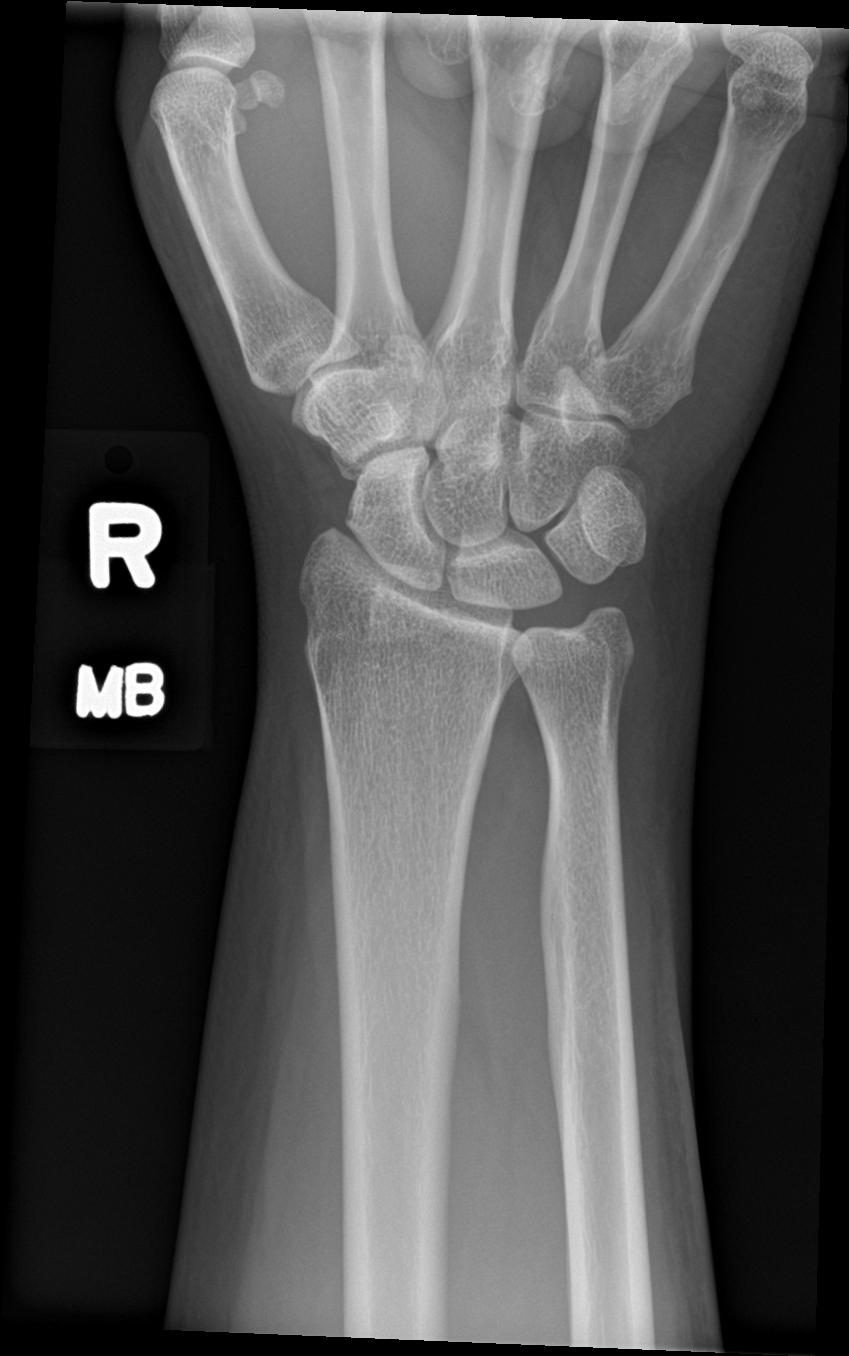

[wrist navicular]
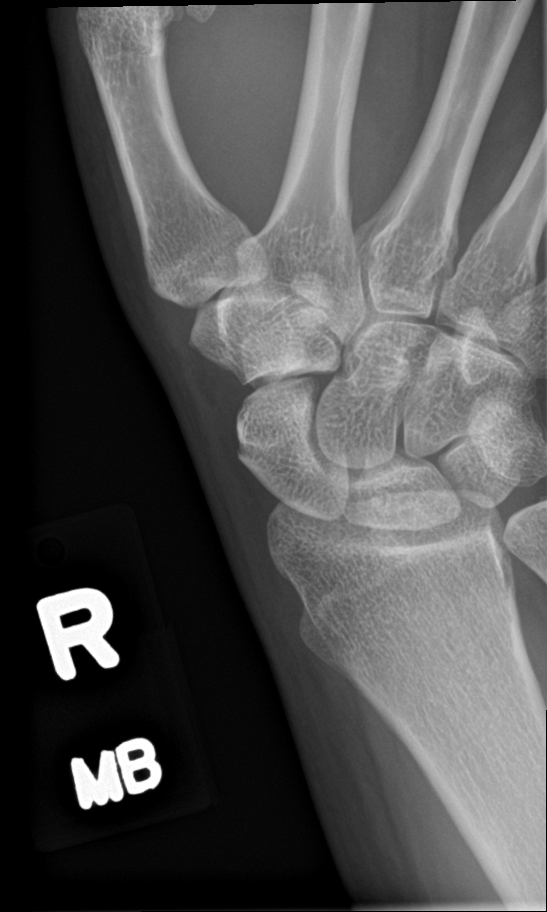

[wrist obl]
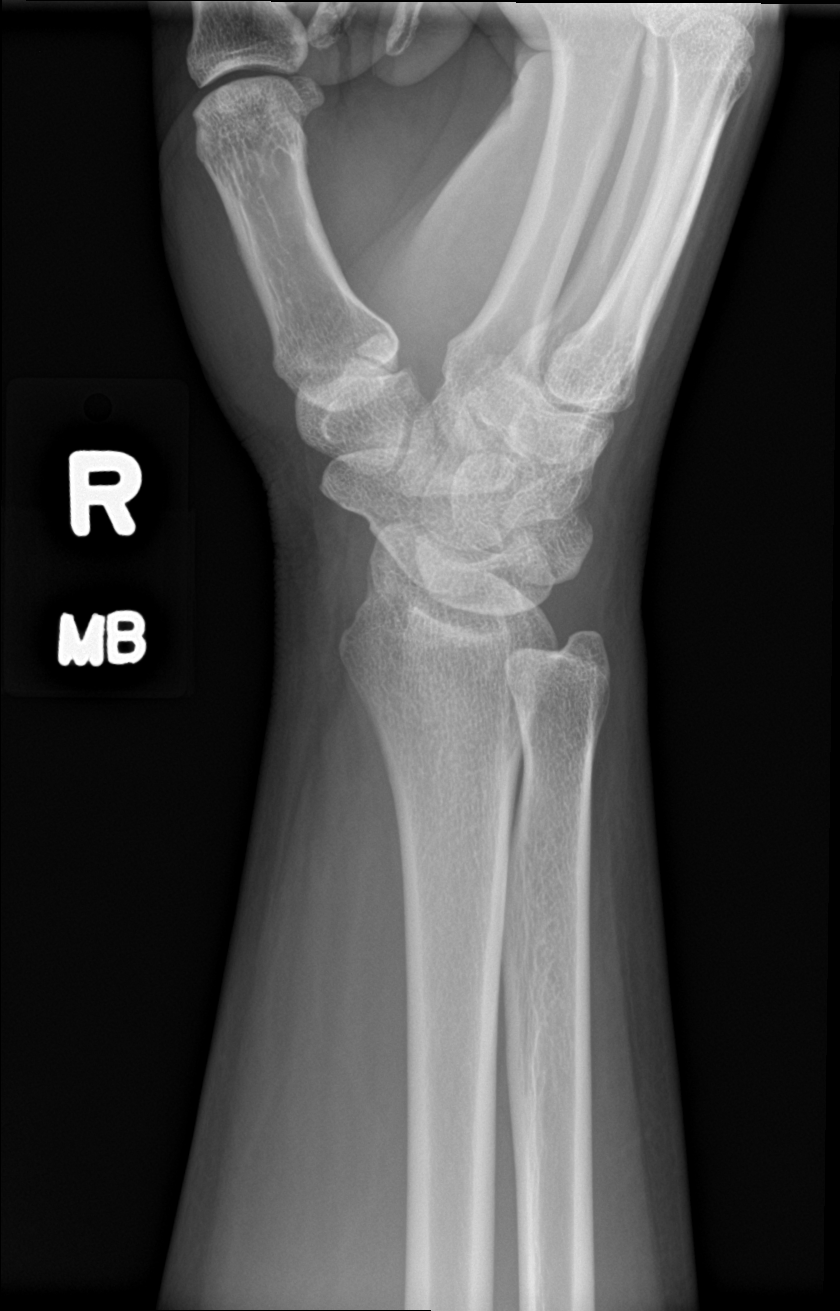

[wrist lat]
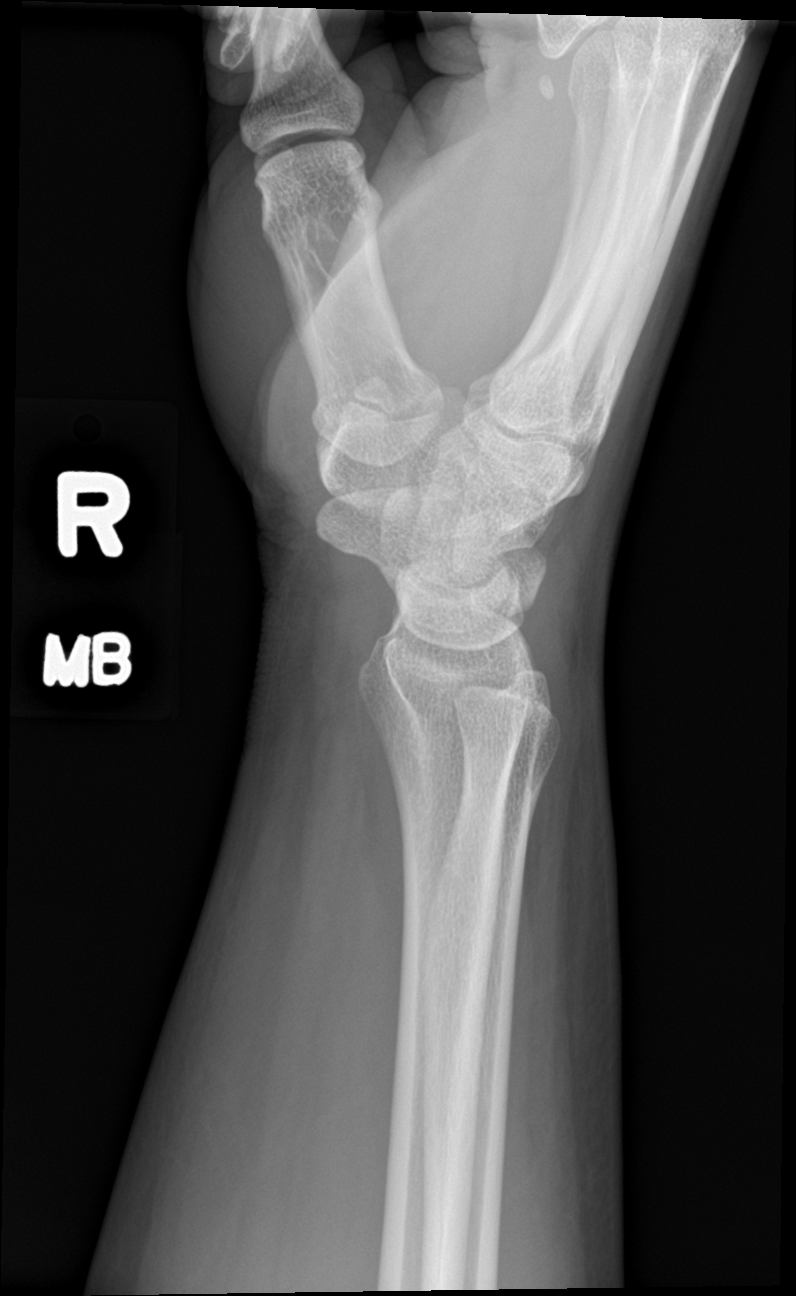

[4 of 4 positions shown; findings below may reference images not displayed]

FINDINGS: There is no evidence of fracture or dislocation. There is no
evidence of arthropathy or other focal bone abnormality. Soft
tissues are unremarkable.
IMPRESSION: Negative.

## 2017-02-17 ENCOUNTER — Ambulatory Visit: Payer: 59 | Admitting: Nurse Practitioner

## 2017-09-22 ENCOUNTER — Emergency Department (HOSPITAL_COMMUNITY): Payer: 59

## 2017-09-22 ENCOUNTER — Emergency Department (HOSPITAL_COMMUNITY)
Admission: EM | Admit: 2017-09-22 | Discharge: 2017-09-23 | Disposition: A | Discharge status: Home / Self Care | Payer: 59 | Attending: Emergency Medicine | Admitting: Emergency Medicine

## 2017-09-22 ENCOUNTER — Other Ambulatory Visit: Payer: Self-pay

## 2017-09-22 DIAGNOSIS — I5032 Chronic diastolic (congestive) heart failure: Secondary | ICD-10-CM | POA: Diagnosis not present

## 2017-09-22 DIAGNOSIS — E039 Hypothyroidism, unspecified: Secondary | ICD-10-CM | POA: Diagnosis not present

## 2017-09-22 DIAGNOSIS — I110 Hypertensive heart disease with heart failure: Secondary | ICD-10-CM | POA: Diagnosis not present

## 2017-09-22 DIAGNOSIS — Z79899 Other long term (current) drug therapy: Secondary | ICD-10-CM | POA: Insufficient documentation

## 2017-09-22 DIAGNOSIS — M25561 Pain in right knee: Secondary | ICD-10-CM | POA: Diagnosis not present

## 2017-09-22 NOTE — ED Triage Notes (Signed)
Patient states that she worked out this a.m., but didn't start having posterior knee pain until a couple of hours after that. Denies injury.

## 2017-09-23 MED ORDER — IBUPROFEN 600 MG PO TABS: 600.0000 mg | ORAL_TABLET | Freq: Four times a day (QID) | ORAL | 0 refills | Status: DC | PRN

## 2017-09-23 MED ORDER — HYDROCODONE-ACETAMINOPHEN 5-325 MG PO TABS: 1.0000 | ORAL_TABLET | ORAL | 0 refills | Status: DC | PRN

## 2017-09-23 NOTE — Discharge Instructions (Addendum)
Follow up with orthopedics if pain persists. Use knee immobilizer when up and around and walk with crutches. Ice and rest the knee frequently. Take medications as prescribed.
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
## 2017-11-07 DIAGNOSIS — N978 Female infertility of other origin: Secondary | ICD-10-CM | POA: Diagnosis not present

## 2017-11-07 DIAGNOSIS — R69 Illness, unspecified: Secondary | ICD-10-CM | POA: Diagnosis not present

## 2017-11-07 DIAGNOSIS — Z0000 Encounter for general adult medical examination without abnormal findings: Secondary | ICD-10-CM | POA: Diagnosis not present

## 2017-12-05 DIAGNOSIS — M533 Sacrococcygeal disorders, not elsewhere classified: Secondary | ICD-10-CM | POA: Diagnosis not present

## 2017-12-05 DIAGNOSIS — G8929 Other chronic pain: Secondary | ICD-10-CM | POA: Diagnosis not present

## 2017-12-05 DIAGNOSIS — M438X6 Other specified deforming dorsopathies, lumbar region: Secondary | ICD-10-CM | POA: Diagnosis not present

## 2017-12-05 DIAGNOSIS — R600 Localized edema: Secondary | ICD-10-CM | POA: Diagnosis not present

## 2017-12-05 DIAGNOSIS — M130 Polyarthritis, unspecified: Secondary | ICD-10-CM | POA: Diagnosis not present

## 2017-12-05 DIAGNOSIS — M47817 Spondylosis without myelopathy or radiculopathy, lumbosacral region: Secondary | ICD-10-CM | POA: Diagnosis not present

## 2017-12-18 DIAGNOSIS — Z0000 Encounter for general adult medical examination without abnormal findings: Secondary | ICD-10-CM | POA: Diagnosis not present

## 2017-12-18 DIAGNOSIS — E039 Hypothyroidism, unspecified: Secondary | ICD-10-CM | POA: Diagnosis not present

## 2017-12-18 DIAGNOSIS — I119 Hypertensive heart disease without heart failure: Secondary | ICD-10-CM | POA: Diagnosis not present

## 2017-12-18 DIAGNOSIS — G43909 Migraine, unspecified, not intractable, without status migrainosus: Secondary | ICD-10-CM | POA: Diagnosis not present

## 2017-12-18 DIAGNOSIS — Z0100 Encounter for examination of eyes and vision without abnormal findings: Secondary | ICD-10-CM | POA: Diagnosis not present

## 2017-12-18 DIAGNOSIS — M2550 Pain in unspecified joint: Secondary | ICD-10-CM | POA: Diagnosis not present

## 2017-12-18 DIAGNOSIS — R69 Illness, unspecified: Secondary | ICD-10-CM | POA: Diagnosis not present

## 2017-12-18 DIAGNOSIS — R635 Abnormal weight gain: Secondary | ICD-10-CM | POA: Diagnosis not present

## 2017-12-18 DIAGNOSIS — I10 Essential (primary) hypertension: Secondary | ICD-10-CM | POA: Diagnosis not present

## 2017-12-18 DIAGNOSIS — Z0110 Encounter for examination of ears and hearing without abnormal findings: Secondary | ICD-10-CM | POA: Diagnosis not present

## 2017-12-18 DIAGNOSIS — N3941 Urge incontinence: Secondary | ICD-10-CM | POA: Diagnosis not present

## 2017-12-18 DIAGNOSIS — R202 Paresthesia of skin: Secondary | ICD-10-CM | POA: Diagnosis not present

## 2017-12-18 DIAGNOSIS — N946 Dysmenorrhea, unspecified: Secondary | ICD-10-CM | POA: Diagnosis not present

## 2017-12-18 DIAGNOSIS — Z131 Encounter for screening for diabetes mellitus: Secondary | ICD-10-CM | POA: Diagnosis not present

## 2017-12-18 DIAGNOSIS — K219 Gastro-esophageal reflux disease without esophagitis: Secondary | ICD-10-CM | POA: Diagnosis not present

## 2018-01-01 DIAGNOSIS — I10 Essential (primary) hypertension: Secondary | ICD-10-CM | POA: Diagnosis not present

## 2018-01-01 DIAGNOSIS — I119 Hypertensive heart disease without heart failure: Secondary | ICD-10-CM | POA: Diagnosis not present

## 2018-01-01 DIAGNOSIS — K219 Gastro-esophageal reflux disease without esophagitis: Secondary | ICD-10-CM | POA: Diagnosis not present

## 2018-01-01 DIAGNOSIS — R69 Illness, unspecified: Secondary | ICD-10-CM | POA: Diagnosis not present

## 2018-01-01 DIAGNOSIS — R42 Dizziness and giddiness: Secondary | ICD-10-CM | POA: Diagnosis not present

## 2018-01-01 DIAGNOSIS — G43909 Migraine, unspecified, not intractable, without status migrainosus: Secondary | ICD-10-CM | POA: Diagnosis not present

## 2018-01-01 DIAGNOSIS — M7918 Myalgia, other site: Secondary | ICD-10-CM | POA: Diagnosis not present

## 2018-01-01 DIAGNOSIS — E039 Hypothyroidism, unspecified: Secondary | ICD-10-CM | POA: Diagnosis not present

## 2018-01-02 DIAGNOSIS — Z0100 Encounter for examination of eyes and vision without abnormal findings: Secondary | ICD-10-CM | POA: Diagnosis not present

## 2018-01-08 DIAGNOSIS — M170 Bilateral primary osteoarthritis of knee: Secondary | ICD-10-CM | POA: Diagnosis not present

## 2018-01-08 DIAGNOSIS — M25562 Pain in left knee: Secondary | ICD-10-CM | POA: Diagnosis not present

## 2018-01-08 DIAGNOSIS — M25511 Pain in right shoulder: Secondary | ICD-10-CM | POA: Insufficient documentation

## 2018-01-08 DIAGNOSIS — M25561 Pain in right knee: Secondary | ICD-10-CM | POA: Diagnosis not present

## 2018-01-08 DIAGNOSIS — G8929 Other chronic pain: Secondary | ICD-10-CM | POA: Insufficient documentation

## 2018-01-08 DIAGNOSIS — M25512 Pain in left shoulder: Secondary | ICD-10-CM | POA: Diagnosis not present

## 2018-01-15 DIAGNOSIS — R69 Illness, unspecified: Secondary | ICD-10-CM | POA: Diagnosis not present

## 2018-01-21 DIAGNOSIS — K219 Gastro-esophageal reflux disease without esophagitis: Secondary | ICD-10-CM | POA: Diagnosis not present

## 2018-01-21 DIAGNOSIS — Z124 Encounter for screening for malignant neoplasm of cervix: Secondary | ICD-10-CM | POA: Diagnosis not present

## 2018-01-21 DIAGNOSIS — I10 Essential (primary) hypertension: Secondary | ICD-10-CM | POA: Diagnosis not present

## 2018-01-21 DIAGNOSIS — I119 Hypertensive heart disease without heart failure: Secondary | ICD-10-CM | POA: Diagnosis not present

## 2018-01-21 DIAGNOSIS — E039 Hypothyroidism, unspecified: Secondary | ICD-10-CM | POA: Diagnosis not present

## 2018-01-21 DIAGNOSIS — R69 Illness, unspecified: Secondary | ICD-10-CM | POA: Diagnosis not present

## 2018-01-21 DIAGNOSIS — G43909 Migraine, unspecified, not intractable, without status migrainosus: Secondary | ICD-10-CM | POA: Diagnosis not present

## 2018-01-22 DIAGNOSIS — K219 Gastro-esophageal reflux disease without esophagitis: Secondary | ICD-10-CM | POA: Diagnosis not present

## 2018-01-22 DIAGNOSIS — R140 Abdominal distension (gaseous): Secondary | ICD-10-CM | POA: Diagnosis not present

## 2018-01-22 DIAGNOSIS — I119 Hypertensive heart disease without heart failure: Secondary | ICD-10-CM | POA: Diagnosis not present

## 2018-01-22 DIAGNOSIS — E039 Hypothyroidism, unspecified: Secondary | ICD-10-CM | POA: Diagnosis not present

## 2018-01-22 DIAGNOSIS — R69 Illness, unspecified: Secondary | ICD-10-CM | POA: Diagnosis not present

## 2018-01-22 DIAGNOSIS — G43909 Migraine, unspecified, not intractable, without status migrainosus: Secondary | ICD-10-CM | POA: Diagnosis not present

## 2018-01-22 DIAGNOSIS — Z124 Encounter for screening for malignant neoplasm of cervix: Secondary | ICD-10-CM | POA: Diagnosis not present

## 2018-01-22 DIAGNOSIS — I10 Essential (primary) hypertension: Secondary | ICD-10-CM | POA: Diagnosis not present

## 2018-01-29 DIAGNOSIS — M130 Polyarthritis, unspecified: Secondary | ICD-10-CM | POA: Diagnosis not present

## 2018-01-29 DIAGNOSIS — G8929 Other chronic pain: Secondary | ICD-10-CM | POA: Diagnosis not present

## 2018-01-29 DIAGNOSIS — R269 Unspecified abnormalities of gait and mobility: Secondary | ICD-10-CM | POA: Diagnosis not present

## 2018-01-29 DIAGNOSIS — M25512 Pain in left shoulder: Secondary | ICD-10-CM | POA: Diagnosis not present

## 2018-01-29 DIAGNOSIS — M533 Sacrococcygeal disorders, not elsewhere classified: Secondary | ICD-10-CM | POA: Diagnosis not present

## 2018-01-29 DIAGNOSIS — M25562 Pain in left knee: Secondary | ICD-10-CM | POA: Diagnosis not present

## 2018-01-29 DIAGNOSIS — M25511 Pain in right shoulder: Secondary | ICD-10-CM | POA: Diagnosis not present

## 2018-01-29 DIAGNOSIS — M25561 Pain in right knee: Secondary | ICD-10-CM | POA: Diagnosis not present

## 2018-02-05 DIAGNOSIS — G8929 Other chronic pain: Secondary | ICD-10-CM | POA: Diagnosis not present

## 2018-02-05 DIAGNOSIS — M25511 Pain in right shoulder: Secondary | ICD-10-CM | POA: Diagnosis not present

## 2018-02-05 DIAGNOSIS — E288 Other ovarian dysfunction: Secondary | ICD-10-CM | POA: Diagnosis not present

## 2018-02-05 DIAGNOSIS — M25562 Pain in left knee: Secondary | ICD-10-CM | POA: Diagnosis not present

## 2018-02-05 DIAGNOSIS — R269 Unspecified abnormalities of gait and mobility: Secondary | ICD-10-CM | POA: Diagnosis not present

## 2018-02-05 DIAGNOSIS — E039 Hypothyroidism, unspecified: Secondary | ICD-10-CM | POA: Diagnosis not present

## 2018-02-05 DIAGNOSIS — M130 Polyarthritis, unspecified: Secondary | ICD-10-CM | POA: Diagnosis not present

## 2018-02-05 DIAGNOSIS — M25561 Pain in right knee: Secondary | ICD-10-CM | POA: Diagnosis not present

## 2018-02-05 DIAGNOSIS — M533 Sacrococcygeal disorders, not elsewhere classified: Secondary | ICD-10-CM | POA: Diagnosis not present

## 2018-02-05 DIAGNOSIS — M25512 Pain in left shoulder: Secondary | ICD-10-CM | POA: Diagnosis not present

## 2018-02-10 DIAGNOSIS — R69 Illness, unspecified: Secondary | ICD-10-CM | POA: Diagnosis not present

## 2018-02-12 DIAGNOSIS — R69 Illness, unspecified: Secondary | ICD-10-CM | POA: Diagnosis not present

## 2018-02-13 DIAGNOSIS — R140 Abdominal distension (gaseous): Secondary | ICD-10-CM | POA: Diagnosis not present

## 2018-02-13 DIAGNOSIS — R109 Unspecified abdominal pain: Secondary | ICD-10-CM | POA: Diagnosis not present

## 2018-02-13 DIAGNOSIS — R1030 Lower abdominal pain, unspecified: Secondary | ICD-10-CM | POA: Diagnosis not present

## 2018-02-16 DIAGNOSIS — G8929 Other chronic pain: Secondary | ICD-10-CM | POA: Diagnosis not present

## 2018-02-16 DIAGNOSIS — M533 Sacrococcygeal disorders, not elsewhere classified: Secondary | ICD-10-CM | POA: Diagnosis not present

## 2018-02-16 DIAGNOSIS — M25512 Pain in left shoulder: Secondary | ICD-10-CM | POA: Diagnosis not present

## 2018-02-16 DIAGNOSIS — M25561 Pain in right knee: Secondary | ICD-10-CM | POA: Diagnosis not present

## 2018-02-16 DIAGNOSIS — M25511 Pain in right shoulder: Secondary | ICD-10-CM | POA: Diagnosis not present

## 2018-02-16 DIAGNOSIS — R269 Unspecified abnormalities of gait and mobility: Secondary | ICD-10-CM | POA: Diagnosis not present

## 2018-02-16 DIAGNOSIS — M130 Polyarthritis, unspecified: Secondary | ICD-10-CM | POA: Diagnosis not present

## 2018-02-16 DIAGNOSIS — M25562 Pain in left knee: Secondary | ICD-10-CM | POA: Diagnosis not present

## 2018-02-26 DIAGNOSIS — R198 Other specified symptoms and signs involving the digestive system and abdomen: Secondary | ICD-10-CM | POA: Diagnosis not present

## 2018-02-26 DIAGNOSIS — R140 Abdominal distension (gaseous): Secondary | ICD-10-CM | POA: Diagnosis not present

## 2018-02-26 DIAGNOSIS — R1013 Epigastric pain: Secondary | ICD-10-CM | POA: Diagnosis not present

## 2018-02-26 DIAGNOSIS — M533 Sacrococcygeal disorders, not elsewhere classified: Secondary | ICD-10-CM | POA: Diagnosis not present

## 2018-02-26 DIAGNOSIS — M25562 Pain in left knee: Secondary | ICD-10-CM | POA: Diagnosis not present

## 2018-02-26 DIAGNOSIS — R69 Illness, unspecified: Secondary | ICD-10-CM | POA: Diagnosis not present

## 2018-02-26 DIAGNOSIS — M25561 Pain in right knee: Secondary | ICD-10-CM | POA: Diagnosis not present

## 2018-02-26 DIAGNOSIS — Z8619 Personal history of other infectious and parasitic diseases: Secondary | ICD-10-CM | POA: Diagnosis not present

## 2018-02-26 DIAGNOSIS — G8929 Other chronic pain: Secondary | ICD-10-CM | POA: Diagnosis not present

## 2018-02-26 DIAGNOSIS — Z1231 Encounter for screening mammogram for malignant neoplasm of breast: Secondary | ICD-10-CM | POA: Diagnosis not present

## 2018-03-01 DIAGNOSIS — M533 Sacrococcygeal disorders, not elsewhere classified: Secondary | ICD-10-CM | POA: Insufficient documentation

## 2018-03-01 DIAGNOSIS — G8929 Other chronic pain: Secondary | ICD-10-CM | POA: Insufficient documentation

## 2018-03-02 DIAGNOSIS — R1013 Epigastric pain: Secondary | ICD-10-CM | POA: Diagnosis not present

## 2018-03-03 DIAGNOSIS — M533 Sacrococcygeal disorders, not elsewhere classified: Secondary | ICD-10-CM | POA: Diagnosis not present

## 2018-03-03 DIAGNOSIS — M25561 Pain in right knee: Secondary | ICD-10-CM | POA: Diagnosis not present

## 2018-03-03 DIAGNOSIS — M25511 Pain in right shoulder: Secondary | ICD-10-CM | POA: Diagnosis not present

## 2018-03-03 DIAGNOSIS — M130 Polyarthritis, unspecified: Secondary | ICD-10-CM | POA: Diagnosis not present

## 2018-03-03 DIAGNOSIS — G8929 Other chronic pain: Secondary | ICD-10-CM | POA: Diagnosis not present

## 2018-03-03 DIAGNOSIS — R269 Unspecified abnormalities of gait and mobility: Secondary | ICD-10-CM | POA: Diagnosis not present

## 2018-03-03 DIAGNOSIS — M25562 Pain in left knee: Secondary | ICD-10-CM | POA: Diagnosis not present

## 2018-03-03 DIAGNOSIS — M25512 Pain in left shoulder: Secondary | ICD-10-CM | POA: Diagnosis not present

## 2018-03-05 DIAGNOSIS — R69 Illness, unspecified: Secondary | ICD-10-CM | POA: Diagnosis not present

## 2018-03-11 DIAGNOSIS — M25562 Pain in left knee: Secondary | ICD-10-CM | POA: Diagnosis not present

## 2018-03-11 DIAGNOSIS — M130 Polyarthritis, unspecified: Secondary | ICD-10-CM | POA: Diagnosis not present

## 2018-03-11 DIAGNOSIS — M25511 Pain in right shoulder: Secondary | ICD-10-CM | POA: Diagnosis not present

## 2018-03-11 DIAGNOSIS — G8929 Other chronic pain: Secondary | ICD-10-CM | POA: Diagnosis not present

## 2018-03-11 DIAGNOSIS — M25561 Pain in right knee: Secondary | ICD-10-CM | POA: Diagnosis not present

## 2018-03-11 DIAGNOSIS — R269 Unspecified abnormalities of gait and mobility: Secondary | ICD-10-CM | POA: Diagnosis not present

## 2018-03-11 DIAGNOSIS — M25512 Pain in left shoulder: Secondary | ICD-10-CM | POA: Diagnosis not present

## 2018-03-11 DIAGNOSIS — M533 Sacrococcygeal disorders, not elsewhere classified: Secondary | ICD-10-CM | POA: Diagnosis not present

## 2018-03-12 DIAGNOSIS — R69 Illness, unspecified: Secondary | ICD-10-CM | POA: Diagnosis not present

## 2018-03-16 DIAGNOSIS — R1013 Epigastric pain: Secondary | ICD-10-CM | POA: Diagnosis not present

## 2018-03-16 DIAGNOSIS — K2930 Chronic superficial gastritis without bleeding: Secondary | ICD-10-CM | POA: Diagnosis not present

## 2018-03-16 DIAGNOSIS — K449 Diaphragmatic hernia without obstruction or gangrene: Secondary | ICD-10-CM | POA: Diagnosis not present

## 2018-03-24 DIAGNOSIS — K2930 Chronic superficial gastritis without bleeding: Secondary | ICD-10-CM | POA: Diagnosis not present

## 2018-04-02 DIAGNOSIS — M25561 Pain in right knee: Secondary | ICD-10-CM | POA: Diagnosis not present

## 2018-04-02 DIAGNOSIS — M25512 Pain in left shoulder: Secondary | ICD-10-CM | POA: Diagnosis not present

## 2018-04-02 DIAGNOSIS — G8929 Other chronic pain: Secondary | ICD-10-CM | POA: Diagnosis not present

## 2018-04-02 DIAGNOSIS — M25562 Pain in left knee: Secondary | ICD-10-CM | POA: Diagnosis not present

## 2018-04-02 DIAGNOSIS — M25511 Pain in right shoulder: Secondary | ICD-10-CM | POA: Diagnosis not present

## 2018-04-02 DIAGNOSIS — M1389 Other specified arthritis, multiple sites: Secondary | ICD-10-CM | POA: Diagnosis not present

## 2018-04-02 DIAGNOSIS — R2689 Other abnormalities of gait and mobility: Secondary | ICD-10-CM | POA: Diagnosis not present

## 2018-04-02 DIAGNOSIS — M533 Sacrococcygeal disorders, not elsewhere classified: Secondary | ICD-10-CM | POA: Diagnosis not present

## 2018-04-02 DIAGNOSIS — R69 Illness, unspecified: Secondary | ICD-10-CM | POA: Diagnosis not present

## 2018-04-09 DIAGNOSIS — R2689 Other abnormalities of gait and mobility: Secondary | ICD-10-CM | POA: Diagnosis not present

## 2018-04-09 DIAGNOSIS — Z139 Encounter for screening, unspecified: Secondary | ICD-10-CM | POA: Diagnosis not present

## 2018-04-09 DIAGNOSIS — M25511 Pain in right shoulder: Secondary | ICD-10-CM | POA: Diagnosis not present

## 2018-04-09 DIAGNOSIS — G8929 Other chronic pain: Secondary | ICD-10-CM | POA: Diagnosis not present

## 2018-04-09 DIAGNOSIS — M25562 Pain in left knee: Secondary | ICD-10-CM | POA: Diagnosis not present

## 2018-04-09 DIAGNOSIS — M25561 Pain in right knee: Secondary | ICD-10-CM | POA: Diagnosis not present

## 2018-04-09 DIAGNOSIS — N978 Female infertility of other origin: Secondary | ICD-10-CM | POA: Diagnosis not present

## 2018-04-09 DIAGNOSIS — M1389 Other specified arthritis, multiple sites: Secondary | ICD-10-CM | POA: Diagnosis not present

## 2018-04-09 DIAGNOSIS — M25512 Pain in left shoulder: Secondary | ICD-10-CM | POA: Diagnosis not present

## 2018-04-09 DIAGNOSIS — M533 Sacrococcygeal disorders, not elsewhere classified: Secondary | ICD-10-CM | POA: Diagnosis not present

## 2018-04-16 DIAGNOSIS — M1389 Other specified arthritis, multiple sites: Secondary | ICD-10-CM | POA: Diagnosis not present

## 2018-04-16 DIAGNOSIS — R69 Illness, unspecified: Secondary | ICD-10-CM | POA: Diagnosis not present

## 2018-04-16 DIAGNOSIS — M2241 Chondromalacia patellae, right knee: Secondary | ICD-10-CM | POA: Diagnosis not present

## 2018-04-16 DIAGNOSIS — M25512 Pain in left shoulder: Secondary | ICD-10-CM | POA: Diagnosis not present

## 2018-04-16 DIAGNOSIS — M7121 Synovial cyst of popliteal space [Baker], right knee: Secondary | ICD-10-CM | POA: Diagnosis not present

## 2018-04-16 DIAGNOSIS — M25511 Pain in right shoulder: Secondary | ICD-10-CM | POA: Diagnosis not present

## 2018-04-16 DIAGNOSIS — M533 Sacrococcygeal disorders, not elsewhere classified: Secondary | ICD-10-CM | POA: Diagnosis not present

## 2018-04-16 DIAGNOSIS — M25561 Pain in right knee: Secondary | ICD-10-CM | POA: Diagnosis not present

## 2018-04-16 DIAGNOSIS — R2689 Other abnormalities of gait and mobility: Secondary | ICD-10-CM | POA: Diagnosis not present

## 2018-04-16 DIAGNOSIS — G8929 Other chronic pain: Secondary | ICD-10-CM | POA: Diagnosis not present

## 2018-04-16 DIAGNOSIS — M25562 Pain in left knee: Secondary | ICD-10-CM | POA: Diagnosis not present

## 2018-04-16 DIAGNOSIS — M659 Synovitis and tenosynovitis, unspecified: Secondary | ICD-10-CM | POA: Diagnosis not present

## 2018-04-23 DIAGNOSIS — M25511 Pain in right shoulder: Secondary | ICD-10-CM | POA: Diagnosis not present

## 2018-04-23 DIAGNOSIS — M533 Sacrococcygeal disorders, not elsewhere classified: Secondary | ICD-10-CM | POA: Diagnosis not present

## 2018-04-23 DIAGNOSIS — G8929 Other chronic pain: Secondary | ICD-10-CM | POA: Diagnosis not present

## 2018-04-23 DIAGNOSIS — M25512 Pain in left shoulder: Secondary | ICD-10-CM | POA: Diagnosis not present

## 2018-04-23 DIAGNOSIS — R269 Unspecified abnormalities of gait and mobility: Secondary | ICD-10-CM | POA: Diagnosis not present

## 2018-04-23 DIAGNOSIS — M25562 Pain in left knee: Secondary | ICD-10-CM | POA: Diagnosis not present

## 2018-04-23 DIAGNOSIS — M25561 Pain in right knee: Secondary | ICD-10-CM | POA: Diagnosis not present

## 2018-04-23 DIAGNOSIS — M1389 Other specified arthritis, multiple sites: Secondary | ICD-10-CM | POA: Diagnosis not present

## 2018-05-05 ENCOUNTER — Other Ambulatory Visit: Payer: Self-pay

## 2018-05-05 ENCOUNTER — Encounter (HOSPITAL_BASED_OUTPATIENT_CLINIC_OR_DEPARTMENT_OTHER): Payer: Self-pay | Admitting: *Deleted

## 2018-05-05 ENCOUNTER — Emergency Department (HOSPITAL_BASED_OUTPATIENT_CLINIC_OR_DEPARTMENT_OTHER)
Admission: EM | Admit: 2018-05-05 | Discharge: 2018-05-05 | Disposition: A | Discharge status: Home / Self Care | Payer: 59 | Attending: Emergency Medicine | Admitting: Emergency Medicine

## 2018-05-05 ENCOUNTER — Emergency Department (HOSPITAL_BASED_OUTPATIENT_CLINIC_OR_DEPARTMENT_OTHER): Payer: 59

## 2018-05-05 DIAGNOSIS — R42 Dizziness and giddiness: Secondary | ICD-10-CM | POA: Diagnosis not present

## 2018-05-05 DIAGNOSIS — I5032 Chronic diastolic (congestive) heart failure: Secondary | ICD-10-CM | POA: Diagnosis not present

## 2018-05-05 DIAGNOSIS — Z79899 Other long term (current) drug therapy: Secondary | ICD-10-CM | POA: Insufficient documentation

## 2018-05-05 DIAGNOSIS — I10 Essential (primary) hypertension: Secondary | ICD-10-CM

## 2018-05-05 DIAGNOSIS — G43809 Other migraine, not intractable, without status migrainosus: Secondary | ICD-10-CM | POA: Diagnosis not present

## 2018-05-05 DIAGNOSIS — E039 Hypothyroidism, unspecified: Secondary | ICD-10-CM | POA: Insufficient documentation

## 2018-05-05 DIAGNOSIS — R51 Headache: Secondary | ICD-10-CM | POA: Diagnosis not present

## 2018-05-05 DIAGNOSIS — I110 Hypertensive heart disease with heart failure: Secondary | ICD-10-CM | POA: Diagnosis not present

## 2018-05-05 DIAGNOSIS — R519 Headache, unspecified: Secondary | ICD-10-CM

## 2018-05-05 HISTORY — DX: Other chronic pain: G89.29

## 2018-05-05 LAB — COMPREHENSIVE METABOLIC PANEL
ALBUMIN: 4.3 g/dL (ref 3.5–5.0)
ALK PHOS: 79 U/L (ref 38–126)
ALT: 20 U/L (ref 0–44)
ANION GAP: 7 (ref 5–15)
AST: 20 U/L (ref 15–41)
BUN: 10 mg/dL (ref 6–20)
CO2: 24 mmol/L (ref 22–32)
Calcium: 8.8 mg/dL — ABNORMAL LOW (ref 8.9–10.3)
Chloride: 107 mmol/L (ref 98–111)
Creatinine, Ser: 0.76 mg/dL (ref 0.44–1.00)
GFR calc Af Amer: >60 mL/min (ref >60)
GFR calc non Af Amer: >60 mL/min (ref >60)
GLUCOSE: 81 mg/dL (ref 70–99)
Potassium: 3.3 mmol/L — ABNORMAL LOW (ref 3.5–5.1)
SODIUM: 138 mmol/L (ref 135–145)
TOTAL PROTEIN: 7.3 g/dL (ref 6.5–8.1)
Total Bilirubin: 0.5 mg/dL (ref 0.3–1.2)

## 2018-05-05 LAB — CBC WITH DIFFERENTIAL/PLATELET
Abs Immature Granulocytes: 0.02 10*3/uL (ref 0.00–0.07)
Basophils Absolute: 0 10*3/uL (ref 0.0–0.1)
Basophils Relative: 1 %
Eosinophils Absolute: 0.1 10*3/uL (ref 0.0–0.5)
Eosinophils Relative: 1 %
HCT: 41 % (ref 36.0–46.0)
HEMOGLOBIN: 13.4 g/dL (ref 12.0–15.0)
IMMATURE GRANULOCYTES: 0 %
LYMPHS PCT: 28 %
Lymphs Abs: 2.2 10*3/uL (ref 0.7–4.0)
MCH: 31.8 pg (ref 26.0–34.0)
MCHC: 32.7 g/dL (ref 30.0–36.0)
MCV: 97.4 fL (ref 80.0–100.0)
Monocytes Absolute: 0.5 10*3/uL (ref 0.1–1.0)
Monocytes Relative: 7 %
NEUTROS ABS: 4.9 10*3/uL (ref 1.7–7.7)
Neutrophils Relative %: 63 %
Platelets: 242 10*3/uL (ref 150–400)
RBC: 4.21 MIL/uL (ref 3.87–5.11)
RDW: 12.7 % (ref 11.5–15.5)
WBC: 7.7 10*3/uL (ref 4.0–10.5)
nRBC: 0 % (ref 0.0–0.2)

## 2018-05-05 MED ORDER — LOSARTAN POTASSIUM-HCTZ 50-12.5 MG PO TABS: 1.0000 | ORAL_TABLET | Freq: Every day | ORAL | 0 refills | Status: DC

## 2018-05-05 MED ORDER — SODIUM CHLORIDE 0.9 % IV BOLUS
500.0000 mL | Freq: Once | INTRAVENOUS | Status: AC
  Administered 2018-05-05: 500 mL via INTRAVENOUS

## 2018-05-05 MED ORDER — PROCHLORPERAZINE EDISYLATE 10 MG/2ML IJ SOLN
10.0000 mg | Freq: Once | INTRAMUSCULAR | Status: AC
  Administered 2018-05-05: 10 mg via INTRAVENOUS
  Filled 2018-05-05: qty 2

## 2018-05-05 MED ORDER — DIPHENHYDRAMINE HCL 50 MG/ML IJ SOLN
25.0000 mg | Freq: Once | INTRAMUSCULAR | Status: AC
  Administered 2018-05-05: 25 mg via INTRAVENOUS
  Filled 2018-05-05: qty 1
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
## 2018-05-05 NOTE — ED Notes (Signed)
Pt c/o headache and dizziness with blurry vision since sunday. Pt has history of migraines. She has been taking amlodipine and she increased her dosage to 10mg  to try to control her b/p

## 2018-05-05 NOTE — ED Notes (Signed)
PT states understanding of care given, follow up care, and medication prescribed. PT ambulated from ED to car with a steady gait. 

## 2018-05-05 NOTE — ED Triage Notes (Signed)
She has a hx of hypertension. Headache, dizziness, blurred vision since yesterday.

## 2018-05-07 DIAGNOSIS — R69 Illness, unspecified: Secondary | ICD-10-CM | POA: Diagnosis not present

## 2018-05-14 DIAGNOSIS — M533 Sacrococcygeal disorders, not elsewhere classified: Secondary | ICD-10-CM | POA: Diagnosis not present

## 2018-05-14 DIAGNOSIS — M25561 Pain in right knee: Secondary | ICD-10-CM | POA: Diagnosis not present

## 2018-05-14 DIAGNOSIS — M25511 Pain in right shoulder: Secondary | ICD-10-CM | POA: Diagnosis not present

## 2018-05-14 DIAGNOSIS — M25562 Pain in left knee: Secondary | ICD-10-CM | POA: Diagnosis not present

## 2018-05-14 DIAGNOSIS — R2689 Other abnormalities of gait and mobility: Secondary | ICD-10-CM | POA: Diagnosis not present

## 2018-05-14 DIAGNOSIS — M1389 Other specified arthritis, multiple sites: Secondary | ICD-10-CM | POA: Diagnosis not present

## 2018-05-14 DIAGNOSIS — M25512 Pain in left shoulder: Secondary | ICD-10-CM | POA: Diagnosis not present

## 2018-05-14 DIAGNOSIS — G8929 Other chronic pain: Secondary | ICD-10-CM | POA: Diagnosis not present

## 2018-05-14 DIAGNOSIS — R69 Illness, unspecified: Secondary | ICD-10-CM | POA: Diagnosis not present

## 2018-05-21 DIAGNOSIS — R69 Illness, unspecified: Secondary | ICD-10-CM | POA: Diagnosis not present

## 2018-06-04 DIAGNOSIS — R69 Illness, unspecified: Secondary | ICD-10-CM | POA: Diagnosis not present

## 2018-06-18 DIAGNOSIS — R69 Illness, unspecified: Secondary | ICD-10-CM | POA: Diagnosis not present

## 2018-06-25 DIAGNOSIS — R69 Illness, unspecified: Secondary | ICD-10-CM | POA: Diagnosis not present

## 2018-07-02 DIAGNOSIS — R69 Illness, unspecified: Secondary | ICD-10-CM | POA: Diagnosis not present

## 2018-07-09 DIAGNOSIS — R69 Illness, unspecified: Secondary | ICD-10-CM | POA: Diagnosis not present

## 2018-07-15 DIAGNOSIS — K625 Hemorrhage of anus and rectum: Secondary | ICD-10-CM | POA: Diagnosis not present

## 2018-07-15 DIAGNOSIS — R198 Other specified symptoms and signs involving the digestive system and abdomen: Secondary | ICD-10-CM | POA: Diagnosis not present

## 2018-07-15 DIAGNOSIS — Z1211 Encounter for screening for malignant neoplasm of colon: Secondary | ICD-10-CM | POA: Diagnosis not present

## 2018-07-15 DIAGNOSIS — R140 Abdominal distension (gaseous): Secondary | ICD-10-CM | POA: Diagnosis not present

## 2018-07-15 DIAGNOSIS — Z8619 Personal history of other infectious and parasitic diseases: Secondary | ICD-10-CM | POA: Diagnosis not present

## 2018-07-16 DIAGNOSIS — R69 Illness, unspecified: Secondary | ICD-10-CM | POA: Diagnosis not present

## 2018-07-19 DIAGNOSIS — H1011 Acute atopic conjunctivitis, right eye: Secondary | ICD-10-CM | POA: Diagnosis not present

## 2018-07-19 DIAGNOSIS — H1012 Acute atopic conjunctivitis, left eye: Secondary | ICD-10-CM | POA: Diagnosis not present

## 2018-07-20 ENCOUNTER — Telehealth: Payer: Self-pay | Admitting: *Deleted

## 2018-07-20 NOTE — Telephone Encounter (Signed)
"  I just spoke to someone and she sent me directly to you.  I called last week and this young lady scheduled me for surgery and for a regular appointment.  I don't know the time."  What date are you scheduled for surgery?  "I'm scheduled for May 13."  Whose your doctor?  "I don't know.  I'm a new patient, I've never been there."  If you're a new patient, we wouldn't have scheduled your for surgery without seeing you first.  "Okay, just never mind."  She hung up the phone.

## 2018-07-23 DIAGNOSIS — R69 Illness, unspecified: Secondary | ICD-10-CM | POA: Diagnosis not present

## 2018-07-27 DIAGNOSIS — G4731 Primary central sleep apnea: Secondary | ICD-10-CM | POA: Diagnosis not present

## 2018-07-30 DIAGNOSIS — R69 Illness, unspecified: Secondary | ICD-10-CM | POA: Diagnosis not present

## 2018-08-06 DIAGNOSIS — Z0183 Encounter for blood typing: Secondary | ICD-10-CM | POA: Diagnosis not present

## 2018-08-06 DIAGNOSIS — R69 Illness, unspecified: Secondary | ICD-10-CM | POA: Diagnosis not present

## 2018-08-06 DIAGNOSIS — Z119 Encounter for screening for infectious and parasitic diseases, unspecified: Secondary | ICD-10-CM | POA: Diagnosis not present

## 2018-08-06 DIAGNOSIS — Z118 Encounter for screening for other infectious and parasitic diseases: Secondary | ICD-10-CM | POA: Diagnosis not present

## 2018-08-06 DIAGNOSIS — Z130 Encounter for screening for diseases of the blood and blood-forming organs and certain disorders involving the immune mechanism: Secondary | ICD-10-CM | POA: Diagnosis not present

## 2018-08-06 DIAGNOSIS — Z1159 Encounter for screening for other viral diseases: Secondary | ICD-10-CM | POA: Diagnosis not present

## 2018-08-10 DIAGNOSIS — M79671 Pain in right foot: Secondary | ICD-10-CM | POA: Diagnosis not present

## 2018-08-10 DIAGNOSIS — M79672 Pain in left foot: Secondary | ICD-10-CM | POA: Diagnosis not present

## 2018-08-10 DIAGNOSIS — M21611 Bunion of right foot: Secondary | ICD-10-CM | POA: Diagnosis not present

## 2018-08-10 DIAGNOSIS — M21612 Bunion of left foot: Secondary | ICD-10-CM | POA: Diagnosis not present

## 2018-08-11 DIAGNOSIS — Z01812 Encounter for preprocedural laboratory examination: Secondary | ICD-10-CM | POA: Diagnosis not present

## 2018-08-11 DIAGNOSIS — Z79899 Other long term (current) drug therapy: Secondary | ICD-10-CM | POA: Diagnosis not present

## 2018-08-11 DIAGNOSIS — M21611 Bunion of right foot: Secondary | ICD-10-CM | POA: Diagnosis not present

## 2018-08-11 DIAGNOSIS — M25571 Pain in right ankle and joints of right foot: Secondary | ICD-10-CM | POA: Diagnosis not present

## 2018-08-11 DIAGNOSIS — M79671 Pain in right foot: Secondary | ICD-10-CM | POA: Diagnosis not present

## 2018-08-12 DIAGNOSIS — M222X1 Patellofemoral disorders, right knee: Secondary | ICD-10-CM | POA: Diagnosis not present

## 2018-08-12 DIAGNOSIS — M172 Bilateral post-traumatic osteoarthritis of knee: Secondary | ICD-10-CM | POA: Diagnosis not present

## 2018-08-12 DIAGNOSIS — M25861 Other specified joint disorders, right knee: Secondary | ICD-10-CM | POA: Diagnosis not present

## 2018-08-12 DIAGNOSIS — M222X2 Patellofemoral disorders, left knee: Secondary | ICD-10-CM | POA: Diagnosis not present

## 2018-08-12 DIAGNOSIS — M25862 Other specified joint disorders, left knee: Secondary | ICD-10-CM | POA: Diagnosis not present

## 2018-08-13 DIAGNOSIS — R69 Illness, unspecified: Secondary | ICD-10-CM | POA: Diagnosis not present

## 2018-08-13 DIAGNOSIS — Z0000 Encounter for general adult medical examination without abnormal findings: Secondary | ICD-10-CM | POA: Diagnosis not present

## 2018-08-13 DIAGNOSIS — Z131 Encounter for screening for diabetes mellitus: Secondary | ICD-10-CM | POA: Diagnosis not present

## 2018-08-13 DIAGNOSIS — G43909 Migraine, unspecified, not intractable, without status migrainosus: Secondary | ICD-10-CM | POA: Diagnosis not present

## 2018-08-13 DIAGNOSIS — Z136 Encounter for screening for cardiovascular disorders: Secondary | ICD-10-CM | POA: Diagnosis not present

## 2018-08-13 DIAGNOSIS — I119 Hypertensive heart disease without heart failure: Secondary | ICD-10-CM | POA: Diagnosis not present

## 2018-08-13 DIAGNOSIS — I10 Essential (primary) hypertension: Secondary | ICD-10-CM | POA: Diagnosis not present

## 2018-08-13 DIAGNOSIS — E039 Hypothyroidism, unspecified: Secondary | ICD-10-CM | POA: Diagnosis not present

## 2018-08-13 DIAGNOSIS — K219 Gastro-esophageal reflux disease without esophagitis: Secondary | ICD-10-CM | POA: Diagnosis not present

## 2018-08-13 DIAGNOSIS — E559 Vitamin D deficiency, unspecified: Secondary | ICD-10-CM | POA: Diagnosis not present

## 2018-08-18 DIAGNOSIS — M25571 Pain in right ankle and joints of right foot: Secondary | ICD-10-CM | POA: Diagnosis not present

## 2018-08-18 DIAGNOSIS — I10 Essential (primary) hypertension: Secondary | ICD-10-CM | POA: Diagnosis not present

## 2018-08-18 DIAGNOSIS — M2011 Hallux valgus (acquired), right foot: Secondary | ICD-10-CM | POA: Diagnosis not present

## 2018-08-18 DIAGNOSIS — M21611 Bunion of right foot: Secondary | ICD-10-CM | POA: Diagnosis not present

## 2018-08-20 DIAGNOSIS — R69 Illness, unspecified: Secondary | ICD-10-CM | POA: Diagnosis not present

## 2018-08-20 DIAGNOSIS — M2242 Chondromalacia patellae, left knee: Secondary | ICD-10-CM | POA: Diagnosis not present

## 2018-08-21 DIAGNOSIS — M12271 Villonodular synovitis (pigmented), right ankle and foot: Secondary | ICD-10-CM | POA: Diagnosis not present

## 2018-08-26 DIAGNOSIS — Z3169 Encounter for other general counseling and advice on procreation: Secondary | ICD-10-CM | POA: Diagnosis not present

## 2018-08-27 DIAGNOSIS — R69 Illness, unspecified: Secondary | ICD-10-CM | POA: Diagnosis not present

## 2018-09-03 DIAGNOSIS — R69 Illness, unspecified: Secondary | ICD-10-CM | POA: Diagnosis not present

## 2018-09-03 DIAGNOSIS — M12271 Villonodular synovitis (pigmented), right ankle and foot: Secondary | ICD-10-CM | POA: Diagnosis not present

## 2018-09-10 DIAGNOSIS — R69 Illness, unspecified: Secondary | ICD-10-CM | POA: Diagnosis not present

## 2018-09-11 DIAGNOSIS — M12271 Villonodular synovitis (pigmented), right ankle and foot: Secondary | ICD-10-CM | POA: Diagnosis not present

## 2018-09-24 DIAGNOSIS — E039 Hypothyroidism, unspecified: Secondary | ICD-10-CM | POA: Diagnosis not present

## 2018-09-24 DIAGNOSIS — I10 Essential (primary) hypertension: Secondary | ICD-10-CM | POA: Diagnosis not present

## 2018-09-24 DIAGNOSIS — G43909 Migraine, unspecified, not intractable, without status migrainosus: Secondary | ICD-10-CM | POA: Diagnosis not present

## 2018-09-24 DIAGNOSIS — I119 Hypertensive heart disease without heart failure: Secondary | ICD-10-CM | POA: Diagnosis not present

## 2018-09-24 DIAGNOSIS — K219 Gastro-esophageal reflux disease without esophagitis: Secondary | ICD-10-CM | POA: Diagnosis not present

## 2018-09-24 DIAGNOSIS — R69 Illness, unspecified: Secondary | ICD-10-CM | POA: Diagnosis not present

## 2018-09-24 DIAGNOSIS — E559 Vitamin D deficiency, unspecified: Secondary | ICD-10-CM | POA: Diagnosis not present

## 2018-10-01 DIAGNOSIS — M21611 Bunion of right foot: Secondary | ICD-10-CM | POA: Diagnosis not present

## 2018-10-01 DIAGNOSIS — R2681 Unsteadiness on feet: Secondary | ICD-10-CM | POA: Diagnosis not present

## 2018-10-01 DIAGNOSIS — M79671 Pain in right foot: Secondary | ICD-10-CM | POA: Diagnosis not present

## 2018-10-01 DIAGNOSIS — M12271 Villonodular synovitis (pigmented), right ankle and foot: Secondary | ICD-10-CM | POA: Diagnosis not present

## 2018-10-01 DIAGNOSIS — R262 Difficulty in walking, not elsewhere classified: Secondary | ICD-10-CM | POA: Diagnosis not present

## 2018-10-08 DIAGNOSIS — R262 Difficulty in walking, not elsewhere classified: Secondary | ICD-10-CM | POA: Diagnosis not present

## 2018-10-08 DIAGNOSIS — M21611 Bunion of right foot: Secondary | ICD-10-CM | POA: Diagnosis not present

## 2018-10-08 DIAGNOSIS — M12271 Villonodular synovitis (pigmented), right ankle and foot: Secondary | ICD-10-CM | POA: Diagnosis not present

## 2018-10-08 DIAGNOSIS — R2681 Unsteadiness on feet: Secondary | ICD-10-CM | POA: Diagnosis not present

## 2018-10-08 DIAGNOSIS — M79671 Pain in right foot: Secondary | ICD-10-CM | POA: Diagnosis not present

## 2018-10-15 DIAGNOSIS — M79671 Pain in right foot: Secondary | ICD-10-CM | POA: Diagnosis not present

## 2018-10-15 DIAGNOSIS — M12271 Villonodular synovitis (pigmented), right ankle and foot: Secondary | ICD-10-CM | POA: Diagnosis not present

## 2018-10-15 DIAGNOSIS — M21611 Bunion of right foot: Secondary | ICD-10-CM | POA: Diagnosis not present

## 2018-10-15 DIAGNOSIS — R2681 Unsteadiness on feet: Secondary | ICD-10-CM | POA: Diagnosis not present

## 2018-10-15 DIAGNOSIS — R262 Difficulty in walking, not elsewhere classified: Secondary | ICD-10-CM | POA: Diagnosis not present

## 2018-10-22 DIAGNOSIS — R262 Difficulty in walking, not elsewhere classified: Secondary | ICD-10-CM | POA: Diagnosis not present

## 2018-10-22 DIAGNOSIS — M79671 Pain in right foot: Secondary | ICD-10-CM | POA: Diagnosis not present

## 2018-10-22 DIAGNOSIS — M12271 Villonodular synovitis (pigmented), right ankle and foot: Secondary | ICD-10-CM | POA: Diagnosis not present

## 2018-10-22 DIAGNOSIS — M21611 Bunion of right foot: Secondary | ICD-10-CM | POA: Diagnosis not present

## 2018-10-22 DIAGNOSIS — R2681 Unsteadiness on feet: Secondary | ICD-10-CM | POA: Diagnosis not present

## 2018-10-22 DIAGNOSIS — R69 Illness, unspecified: Secondary | ICD-10-CM | POA: Diagnosis not present

## 2018-10-29 DIAGNOSIS — R2681 Unsteadiness on feet: Secondary | ICD-10-CM | POA: Diagnosis not present

## 2018-10-29 DIAGNOSIS — R262 Difficulty in walking, not elsewhere classified: Secondary | ICD-10-CM | POA: Diagnosis not present

## 2018-10-29 DIAGNOSIS — R69 Illness, unspecified: Secondary | ICD-10-CM | POA: Diagnosis not present

## 2018-10-29 DIAGNOSIS — M79671 Pain in right foot: Secondary | ICD-10-CM | POA: Diagnosis not present

## 2018-10-29 DIAGNOSIS — M21611 Bunion of right foot: Secondary | ICD-10-CM | POA: Diagnosis not present

## 2018-10-29 DIAGNOSIS — M12271 Villonodular synovitis (pigmented), right ankle and foot: Secondary | ICD-10-CM | POA: Diagnosis not present

## 2018-11-05 DIAGNOSIS — R69 Illness, unspecified: Secondary | ICD-10-CM | POA: Diagnosis not present

## 2018-11-06 DIAGNOSIS — R262 Difficulty in walking, not elsewhere classified: Secondary | ICD-10-CM | POA: Diagnosis not present

## 2018-11-06 DIAGNOSIS — M21611 Bunion of right foot: Secondary | ICD-10-CM | POA: Diagnosis not present

## 2018-11-06 DIAGNOSIS — M79671 Pain in right foot: Secondary | ICD-10-CM | POA: Diagnosis not present

## 2018-11-12 DIAGNOSIS — R69 Illness, unspecified: Secondary | ICD-10-CM | POA: Diagnosis not present

## 2018-11-19 DIAGNOSIS — M12271 Villonodular synovitis (pigmented), right ankle and foot: Secondary | ICD-10-CM | POA: Diagnosis not present

## 2018-11-19 DIAGNOSIS — M21611 Bunion of right foot: Secondary | ICD-10-CM | POA: Diagnosis not present

## 2018-11-19 DIAGNOSIS — R69 Illness, unspecified: Secondary | ICD-10-CM | POA: Diagnosis not present

## 2018-11-19 DIAGNOSIS — M79671 Pain in right foot: Secondary | ICD-10-CM | POA: Diagnosis not present

## 2018-11-19 DIAGNOSIS — R262 Difficulty in walking, not elsewhere classified: Secondary | ICD-10-CM | POA: Diagnosis not present

## 2018-11-26 DIAGNOSIS — M21611 Bunion of right foot: Secondary | ICD-10-CM | POA: Diagnosis not present

## 2018-11-26 DIAGNOSIS — R2681 Unsteadiness on feet: Secondary | ICD-10-CM | POA: Diagnosis not present

## 2018-11-26 DIAGNOSIS — R262 Difficulty in walking, not elsewhere classified: Secondary | ICD-10-CM | POA: Diagnosis not present

## 2018-11-26 DIAGNOSIS — M12271 Villonodular synovitis (pigmented), right ankle and foot: Secondary | ICD-10-CM | POA: Diagnosis not present

## 2018-11-26 DIAGNOSIS — R69 Illness, unspecified: Secondary | ICD-10-CM | POA: Diagnosis not present

## 2018-11-26 DIAGNOSIS — M79671 Pain in right foot: Secondary | ICD-10-CM | POA: Diagnosis not present

## 2018-12-03 DIAGNOSIS — R2681 Unsteadiness on feet: Secondary | ICD-10-CM | POA: Diagnosis not present

## 2018-12-03 DIAGNOSIS — R69 Illness, unspecified: Secondary | ICD-10-CM | POA: Diagnosis not present

## 2018-12-03 DIAGNOSIS — M21611 Bunion of right foot: Secondary | ICD-10-CM | POA: Diagnosis not present

## 2018-12-03 DIAGNOSIS — M12271 Villonodular synovitis (pigmented), right ankle and foot: Secondary | ICD-10-CM | POA: Diagnosis not present

## 2018-12-03 DIAGNOSIS — R262 Difficulty in walking, not elsewhere classified: Secondary | ICD-10-CM | POA: Diagnosis not present

## 2018-12-03 DIAGNOSIS — M79671 Pain in right foot: Secondary | ICD-10-CM | POA: Diagnosis not present

## 2018-12-10 DIAGNOSIS — R69 Illness, unspecified: Secondary | ICD-10-CM | POA: Diagnosis not present

## 2018-12-17 DIAGNOSIS — R69 Illness, unspecified: Secondary | ICD-10-CM | POA: Diagnosis not present

## 2018-12-17 DIAGNOSIS — T8484XA Pain due to internal orthopedic prosthetic devices, implants and grafts, initial encounter: Secondary | ICD-10-CM | POA: Diagnosis not present

## 2018-12-17 DIAGNOSIS — M12271 Villonodular synovitis (pigmented), right ankle and foot: Secondary | ICD-10-CM | POA: Diagnosis not present

## 2018-12-17 DIAGNOSIS — M25571 Pain in right ankle and joints of right foot: Secondary | ICD-10-CM | POA: Diagnosis not present

## 2019-01-14 DIAGNOSIS — Z0000 Encounter for general adult medical examination without abnormal findings: Secondary | ICD-10-CM | POA: Diagnosis not present

## 2019-02-01 ENCOUNTER — Other Ambulatory Visit: Payer: Self-pay

## 2019-02-01 ENCOUNTER — Telehealth: Payer: Self-pay | Admitting: *Deleted

## 2019-02-01 ENCOUNTER — Ambulatory Visit (INDEPENDENT_AMBULATORY_CARE_PROVIDER_SITE_OTHER): Payer: 59 | Admitting: Obstetrics and Gynecology

## 2019-02-01 ENCOUNTER — Encounter: Payer: Self-pay | Admitting: Obstetrics and Gynecology

## 2019-02-01 VITALS — BP 154/93 | HR 80 | Ht 67.5 in | Wt 233.0 lb

## 2019-02-01 DIAGNOSIS — Z01419 Encounter for gynecological examination (general) (routine) without abnormal findings: Secondary | ICD-10-CM | POA: Diagnosis not present
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
## 2019-02-01 NOTE — Telephone Encounter (Signed)
April Dyer left a message she was just at office to see Dr.Constant . States she wanted to make sure she gave the right information. States her last pap was 01/22/18 and was due this year but didin't have one today. States lat mammogram was December last year. Wants to know if she needs to make another appointment for pap smear.  Per chart review is [place] patient , will forward to that office. Wing Gfeller,RN

## 2019-02-02 ENCOUNTER — Telehealth: Payer: Self-pay | Admitting: General Practice

## 2019-02-02 NOTE — Telephone Encounter (Signed)
Patient called and left message stating April Dyer saw Dr Elly Modena yesterday as a new patient and records were being requested. April Dyer states do not worry about getting the records, April Dyer will not be returning as April Dyer wasn't happy with her care and how things went at the appt.

## 2019-02-12 ENCOUNTER — Ambulatory Visit (INDEPENDENT_AMBULATORY_CARE_PROVIDER_SITE_OTHER): Payer: 59

## 2019-02-12 ENCOUNTER — Other Ambulatory Visit: Payer: Self-pay

## 2019-02-12 ENCOUNTER — Ambulatory Visit (INDEPENDENT_AMBULATORY_CARE_PROVIDER_SITE_OTHER): Payer: 59 | Admitting: Podiatry

## 2019-02-12 ENCOUNTER — Encounter: Payer: Self-pay | Admitting: Podiatry

## 2019-02-12 DIAGNOSIS — R600 Localized edema: Secondary | ICD-10-CM | POA: Diagnosis not present

## 2019-02-12 DIAGNOSIS — Z472 Encounter for removal of internal fixation device: Secondary | ICD-10-CM

## 2019-02-12 DIAGNOSIS — M2011 Hallux valgus (acquired), right foot: Secondary | ICD-10-CM | POA: Diagnosis not present

## 2019-02-12 LAB — CBC WITH DIFFERENTIAL/PLATELET
Absolute Monocytes: 681 cells/uL (ref 200–950)
Basophils Absolute: 42 cells/uL (ref 0–200)
Basophils Relative: 0.5 %
Eosinophils Absolute: 149 cells/uL (ref 15–500)
Eosinophils Relative: 1.8 %
HCT: 36.3 % (ref 35.0–45.0)
Hemoglobin: 12.4 g/dL (ref 11.7–15.5)
Lymphs Abs: 1992 cells/uL (ref 850–3900)
MCH: 33 pg (ref 27.0–33.0)
MCHC: 34.2 g/dL (ref 32.0–36.0)
MCV: 96.5 fL (ref 80.0–100.0)
MPV: 11.1 fL (ref 7.5–12.5)
Monocytes Relative: 8.2 %
Neutro Abs: 5437 cells/uL (ref 1500–7800)
Neutrophils Relative %: 65.5 %
Platelets: 258 10*3/uL (ref 140–400)
RBC: 3.76 10*6/uL — ABNORMAL LOW (ref 3.80–5.10)
RDW: 12.9 % (ref 11.0–15.0)
Total Lymphocyte: 24 %
WBC: 8.3 10*3/uL (ref 3.8–10.8)

## 2019-02-12 LAB — SEDIMENTATION RATE: Sed Rate: 6 mm/h (ref 0–20)

## 2019-02-12 NOTE — Progress Notes (Signed)
   Subjective:    Patient ID: April Dyer, female    DOB: Aug 19, 1970, 48 y.o.   MRN: [id number]  HPI    Review of Systems  All other systems reviewed and are negative.      Objective:   Physical Exam        Assessment & Plan:

## 2019-02-12 NOTE — Progress Notes (Signed)
Subjective:   Patient ID: April Dyer, female   DOB: 48 y.o.   MRN: [id number]   HPI Patient presents stating she was operated on the end of May and she still gets a lot of swelling in her foot and she went to another doctor who is no longer seeing patients.  He wanted to take her pin out and states he thought that might solve her problem.  Patient does not smoke and likes to be active   Review of Systems  All other systems reviewed and are negative.       Objective:  Physical Exam Vitals signs and nursing note reviewed.  Constitutional:      Appearance: She is well-developed.  Pulmonary:     Effort: Pulmonary effort is normal.  Musculoskeletal: Normal range of motion.  Skin:    General: Skin is warm.  Neurological:     Mental Status: She is alert.     Neurovascular status intact muscle strength found to be adequate range of motion within normal limits.  Patient is noted to have good range of motion of the first MPJ previous incision of the first metatarsal and does not have crepitus of the joint.  There is moderate swelling of the first MPJ and into the midfoot with negative Bevelyn Buckles' sign noted.     Assessment:  Patient may have symptoms associated with the healing of the first MPJ or this may be a relatively normal healing process with the possibility for pin irritation     Plan:  H&P reviewed condition and x-rays.  I am getting go ahead and place him Unna boot on Ace wrap surgical shoe to try to reduce the swelling process and I then went ahead and I am going to send for CBC with differential and sed rate to rule out any kind of infective process even though there is no drainage or redness in the foot.  On good watch this and pin can be removed but I am concerned about why the swelling is still occurring and will get a focus on reducing that  X-ray indicates there is a previous osteotomy of the first metatarsal right which may have healed with slight secondary bone intent  but it does appear to be healed okay and the joint itself appears congruence pin is intact with possible irritation plantarly

## 2019-02-17 ENCOUNTER — Encounter: Payer: Self-pay | Admitting: Podiatry

## 2019-02-17 ENCOUNTER — Ambulatory Visit (INDEPENDENT_AMBULATORY_CARE_PROVIDER_SITE_OTHER): Payer: 59 | Admitting: Podiatry

## 2019-02-17 ENCOUNTER — Other Ambulatory Visit: Payer: Self-pay

## 2019-02-17 DIAGNOSIS — R600 Localized edema: Secondary | ICD-10-CM | POA: Diagnosis not present

## 2019-02-17 DIAGNOSIS — M779 Enthesopathy, unspecified: Secondary | ICD-10-CM | POA: Diagnosis not present

## 2019-02-17 NOTE — Progress Notes (Signed)
Subjective:   Patient ID: April Dyer, female   DOB: 48 y.o.   MRN: [id number]   HPI Patient states that the foot felt some better with swelling reduced but it continues to bother her in the big toe joint right and states the Unna boot seem to make a big difference   ROS      Objective:  Physical Exam  Neurovascular status intact negative Bevelyn Buckles' sign noted with forefoot swelling still noted right persistent which has improved some with Unna boot but still present with inflammation mostly now around the first MPJ right and into the lateral portion of the joint surface     Assessment:  5 months after surgery I am hoping we are dealing with an inflammatory process versus a bony process and at this point I did a careful injection of the lateral side of the joint 3 mg Dexasone Kenalog 5 mg Xylocaine and reapplied Unna boot to reduce swelling.  Reappoint 2 weeks and reevaluate how she is doing decide what else may be necessary     Plan:  Reviewed possibility for capsulitis associated with this along with the edema

## 2019-02-24 ENCOUNTER — Ambulatory Visit: Payer: 59 | Admitting: Podiatry

## 2019-03-04 ENCOUNTER — Encounter: Payer: Self-pay | Admitting: Podiatry

## 2019-03-04 ENCOUNTER — Ambulatory Visit (INDEPENDENT_AMBULATORY_CARE_PROVIDER_SITE_OTHER): Payer: 59 | Admitting: Podiatry

## 2019-03-04 ENCOUNTER — Other Ambulatory Visit: Payer: Self-pay

## 2019-03-04 DIAGNOSIS — R600 Localized edema: Secondary | ICD-10-CM

## 2019-03-04 DIAGNOSIS — M779 Enthesopathy, unspecified: Secondary | ICD-10-CM

## 2019-03-04 DIAGNOSIS — R69 Illness, unspecified: Secondary | ICD-10-CM | POA: Diagnosis not present

## 2019-03-04 DIAGNOSIS — M2011 Hallux valgus (acquired), right foot: Secondary | ICD-10-CM

## 2019-03-04 MED ORDER — DICLOFENAC SODIUM 75 MG PO TBEC: 75.0000 mg | DELAYED_RELEASE_TABLET | Freq: Two times a day (BID) | ORAL | 2 refills | Status: DC

## 2019-03-04 NOTE — Progress Notes (Signed)
Subjective:   Patient ID: April Dyer, female   DOB: 48 y.o.   MRN: [id number]   HPI Patient states swelling is getting better but I am still having pain in my right foot and it seems to be more underneath the joints of the little toes versus the big toe joint currently   ROS      Objective:  Physical Exam  Neurovascular status intact with inflammation around the first MPJ which seems to be improved with medication Unna boot with inflammation pain around the second and third MPJs right     Assessment:  Inflammatory capsulitis second and third MPJs right with what appears to be improved inflammation around the first MPJ right     Plan:  Sterile prep of each joint and I injected quarter cc dexamethasone half cc Xylocaine and discussed possible orthotics and we will see back again in 3 weeks see how she is responding and decide what else we may need to do to help her

## 2019-03-11 DIAGNOSIS — R69 Illness, unspecified: Secondary | ICD-10-CM | POA: Diagnosis not present

## 2019-03-22 DIAGNOSIS — Z20828 Contact with and (suspected) exposure to other viral communicable diseases: Secondary | ICD-10-CM | POA: Diagnosis not present

## 2019-03-22 DIAGNOSIS — Z03818 Encounter for observation for suspected exposure to other biological agents ruled out: Secondary | ICD-10-CM | POA: Diagnosis not present

## 2019-03-25 ENCOUNTER — Ambulatory Visit: Payer: Self-pay | Admitting: Podiatry

## 2019-04-01 ENCOUNTER — Ambulatory Visit: Payer: 59 | Admitting: Obstetrics and Gynecology

## 2019-04-01 DIAGNOSIS — G43719 Chronic migraine without aura, intractable, without status migrainosus: Secondary | ICD-10-CM | POA: Diagnosis not present

## 2019-04-01 DIAGNOSIS — G43901 Migraine, unspecified, not intractable, with status migrainosus: Secondary | ICD-10-CM | POA: Diagnosis not present

## 2019-04-01 DIAGNOSIS — Z049 Encounter for examination and observation for unspecified reason: Secondary | ICD-10-CM | POA: Diagnosis not present

## 2019-04-07 ENCOUNTER — Ambulatory Visit (INDEPENDENT_AMBULATORY_CARE_PROVIDER_SITE_OTHER): Payer: No Typology Code available for payment source | Admitting: Podiatry

## 2019-04-07 ENCOUNTER — Other Ambulatory Visit: Payer: Self-pay

## 2019-04-07 ENCOUNTER — Encounter: Payer: Self-pay | Admitting: Podiatry

## 2019-04-07 DIAGNOSIS — M779 Enthesopathy, unspecified: Secondary | ICD-10-CM | POA: Diagnosis not present

## 2019-04-07 DIAGNOSIS — M21612 Bunion of left foot: Secondary | ICD-10-CM | POA: Diagnosis not present

## 2019-04-07 NOTE — Progress Notes (Signed)
Subjective:   Patient ID: April Dyer, female   DOB: 49 y.o.   MRN: [id number]   HPI Patient states the right foot is improving and states that the injection made a difference.  Still gets some tingling and is concerned about the side of the big toe with patient having had bunion surgery by another physician last year.  Patient is concerned about the position of the big toe and is also concerned about the bunion left and at one point would like to get it corrected neuro   ROS      Objective:  Physical Exam  Vascular status intact with diminished discomfort of the midfoot and forefoot right with patient needing to wear more rigid bottom shoes.  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  I absolutely explained this may not give her complete relief but it would put her in a better functioning position and possibly we would end up having to do the right big toe at 1 point in future.  I evaluate explained all this to her and for the right foot she will utilize rigid bottom shoes and no further treatment except for compression and rigid bottom shoes.  Patient will be seen back to recheck

## 2019-04-09 ENCOUNTER — Telehealth: Payer: Self-pay | Admitting: Podiatry

## 2019-04-09 NOTE — Telephone Encounter (Signed)
Pt called back to schedule sx. Scheduled sx for Tuesday 02/09 and is scheduled to come in for her sx consult on Friday 01/29 at 7:30 am.

## 2019-04-09 NOTE — Telephone Encounter (Signed)
Left voicemail to get pt scheduled for sx with Dr. Paulla Dolly. Offered sx date of 02/02 and told her she would need to come back in for a sx consult appt to go over the surgery, have questions answered, and sign her consent forms. Asked pt to call me back.

## 2019-04-09 NOTE — Telephone Encounter (Signed)
I'm calling to schedule surgery with the wonderful Dr. Paulla Dolly. Please give me a call back. Thank you.

## 2019-04-19 DIAGNOSIS — R69 Illness, unspecified: Secondary | ICD-10-CM | POA: Diagnosis not present

## 2019-04-23 ENCOUNTER — Encounter: Payer: Self-pay | Admitting: Podiatry

## 2019-04-23 ENCOUNTER — Ambulatory Visit (INDEPENDENT_AMBULATORY_CARE_PROVIDER_SITE_OTHER): Payer: No Typology Code available for payment source

## 2019-04-23 ENCOUNTER — Other Ambulatory Visit: Payer: Self-pay

## 2019-04-23 ENCOUNTER — Ambulatory Visit (INDEPENDENT_AMBULATORY_CARE_PROVIDER_SITE_OTHER): Payer: No Typology Code available for payment source | Admitting: Podiatry

## 2019-04-23 ENCOUNTER — Telehealth: Payer: Self-pay | Admitting: Podiatry

## 2019-04-23 VITALS — Temp 97.9°F

## 2019-04-23 DIAGNOSIS — M2011 Hallux valgus (acquired), right foot: Secondary | ICD-10-CM

## 2019-04-23 DIAGNOSIS — M2012 Hallux valgus (acquired), left foot: Secondary | ICD-10-CM

## 2019-04-23 NOTE — Telephone Encounter (Signed)
DOS: 05/04/2019  SURGICAL PROCEDURES: Altamese [place] JJHE([id number]) and Derotational Aiken Osteotomy [id number]).  [employer] [place] Preferred Effective 08/24/2018 -  Deductible is $2,800 with $2,800 met and $0 remaining. Out of Pocket is $5,600 with $3,328.04 met and $2,271.96 remaining. CoInsurance is 80% / 20%.  Per Aly T no prior authorization or referral is required. Call ref# [id number].

## 2019-04-23 NOTE — Progress Notes (Signed)
Subjective:   Patient ID: April Dyer, female   DOB: 49 y.o.   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  Discussed distal osteotomy along with Akin osteotomy and I did explain that she has adductus foot structure and will not be able to give her a cosmetically perfect foot secondary to the underlying deformity of the entire foot.  She understands is willing to accept risk want surgery signed consent form understanding recovery will take 6 months to 1 year.  Dispensed new air fracture walker as hers is no longer effective and patient is scheduled for outpatient surgery and is encouraged to call with questions  X-rays indicate that the osteotomies right are doing well and that there is significant adductus deformity left with structural bunion deformity and frontal plane hallux deformity

## 2019-04-23 NOTE — Patient Instructions (Signed)
Pre-Operative Instructions  Congratulations, you have decided to take an important step towards improving your quality of life.  You can be assured that the doctors and staff at [facility] will be with you every step of the way.  Here are some important things you should know:  1. Plan to be at the surgery center/hospital at least 1 (one) hour prior to your scheduled time, unless otherwise directed by the surgical center/hospital staff.  You must have a responsible adult accompany you, remain during the surgery and drive you home.  Make sure you have directions to the surgical center/hospital to ensure you arrive on time. 2. If you are having surgery at [facility] or [facility], you will need a copy of your medical history and physical form from your family physician within one month prior to the date of surgery. We will give you a form for your primary physician to complete.  3. We make every effort to accommodate the date you request for surgery.  However, there are times where surgery dates or times have to be moved.  We will contact you as soon as possible if a change in schedule is required.   4. No aspirin/ibuprofen for one week before surgery.  If you are on aspirin, any non-steroidal anti-inflammatory medications (Mobic, Aleve, Ibuprofen) should not be taken seven (7) days prior to your surgery.  You make take Tylenol for pain prior to surgery.  5. Medications - If you are taking daily heart and blood pressure medications, seizure, reflux, allergy, asthma, anxiety, pain or diabetes medications, make sure you notify the surgery center/hospital before the day of surgery so they can tell you which medications you should take or avoid the day of surgery. 6. No food or drink after midnight the night before surgery unless directed otherwise by surgical center/hospital staff. 7. No alcoholic beverages 24-hours prior to surgery.  No smoking 24-hours prior or 24-hours after  surgery. 8. Wear loose pants or shorts. They should be loose enough to fit over bandages, boots, and casts. 9. Don't wear slip-on shoes. Sneakers are preferred. 10. Bring your boot with you to the surgery center/hospital.  Also bring crutches or a walker if your physician has prescribed it for you.  If you do not have this equipment, it will be provided for you after surgery. 11. If you have not been contacted by the surgery center/hospital by the day before your surgery, call to confirm the date and time of your surgery. 12. Leave-time from work may vary depending on the type of surgery you have.  Appropriate arrangements should be made prior to surgery with your employer. 13. Prescriptions will be provided immediately following surgery by your doctor.  Fill these as soon as possible after surgery and take the medication as directed. Pain medications will not be refilled on weekends and must be approved by the doctor. 14. Remove nail polish on the operative foot and avoid getting pedicures prior to surgery. 15. Wash the night before surgery.  The night before surgery wash the foot and leg well with water and the antibacterial soap provided. Be sure to pay special attention to beneath the toenails and in between the toes.  Wash for at least three (3) minutes. Rinse thoroughly with water and dry well with a towel.  Perform this wash unless told not to do so by your physician.  Enclosed: 1 Ice pack (please put in freezer the night before surgery)   1 Hibiclens skin cleaner     Pre-op instructions  If you have any questions regarding the instructions, please do not hesitate to call our office.  [place]: [address], [place], [place] [address] -- [phone]  [place]: [address] [place], [place] [address] -- [phone]  [place]: [address], [place], [place] [address] -- [phone]   Website: [email or web address] 

## 2019-04-29 DIAGNOSIS — R69 Illness, unspecified: Secondary | ICD-10-CM | POA: Diagnosis not present

## 2019-04-29 DIAGNOSIS — E288 Other ovarian dysfunction: Secondary | ICD-10-CM | POA: Diagnosis not present

## 2019-05-03 MED ORDER — OXYCODONE-ACETAMINOPHEN 10-325 MG PO TABS: 1.0000 | ORAL_TABLET | ORAL | 0 refills | Status: DC | PRN

## 2019-05-03 MED ORDER — ONDANSETRON HCL 4 MG PO TABS: 4.0000 mg | ORAL_TABLET | Freq: Three times a day (TID) | ORAL | 0 refills | Status: DC | PRN

## 2019-05-03 NOTE — Addendum Note (Signed)
Addended by: Wallene Huh on: 05/03/2019 04:45 PM   Modules accepted: Orders

## 2019-05-04 ENCOUNTER — Encounter: Payer: Self-pay | Admitting: Podiatry

## 2019-05-04 DIAGNOSIS — M2042 Other hammer toe(s) (acquired), left foot: Secondary | ICD-10-CM | POA: Diagnosis not present

## 2019-05-04 DIAGNOSIS — M2012 Hallux valgus (acquired), left foot: Secondary | ICD-10-CM | POA: Diagnosis not present

## 2019-05-06 DIAGNOSIS — R69 Illness, unspecified: Secondary | ICD-10-CM | POA: Diagnosis not present

## 2019-05-13 ENCOUNTER — Encounter: Payer: No Typology Code available for payment source | Admitting: Podiatry

## 2019-05-13 DIAGNOSIS — R69 Illness, unspecified: Secondary | ICD-10-CM | POA: Diagnosis not present

## 2019-05-18 ENCOUNTER — Encounter: Payer: Self-pay | Admitting: Podiatry

## 2019-05-18 ENCOUNTER — Ambulatory Visit (INDEPENDENT_AMBULATORY_CARE_PROVIDER_SITE_OTHER): Payer: 59 | Admitting: Podiatry

## 2019-05-18 ENCOUNTER — Ambulatory Visit (INDEPENDENT_AMBULATORY_CARE_PROVIDER_SITE_OTHER): Payer: 59

## 2019-05-18 ENCOUNTER — Other Ambulatory Visit: Payer: Self-pay

## 2019-05-18 DIAGNOSIS — Z9889 Other specified postprocedural states: Secondary | ICD-10-CM

## 2019-05-18 DIAGNOSIS — M2012 Hallux valgus (acquired), left foot: Secondary | ICD-10-CM

## 2019-05-19 DIAGNOSIS — Z1231 Encounter for screening mammogram for malignant neoplasm of breast: Secondary | ICD-10-CM | POA: Diagnosis not present

## 2019-05-20 DIAGNOSIS — G43909 Migraine, unspecified, not intractable, without status migrainosus: Secondary | ICD-10-CM | POA: Diagnosis not present

## 2019-05-20 DIAGNOSIS — E039 Hypothyroidism, unspecified: Secondary | ICD-10-CM | POA: Diagnosis not present

## 2019-05-20 DIAGNOSIS — K219 Gastro-esophageal reflux disease without esophagitis: Secondary | ICD-10-CM | POA: Diagnosis not present

## 2019-05-20 DIAGNOSIS — Z136 Encounter for screening for cardiovascular disorders: Secondary | ICD-10-CM | POA: Diagnosis not present

## 2019-05-20 DIAGNOSIS — I10 Essential (primary) hypertension: Secondary | ICD-10-CM | POA: Diagnosis not present

## 2019-05-20 DIAGNOSIS — R69 Illness, unspecified: Secondary | ICD-10-CM | POA: Diagnosis not present

## 2019-05-20 DIAGNOSIS — I119 Hypertensive heart disease without heart failure: Secondary | ICD-10-CM | POA: Diagnosis not present

## 2019-05-22 ENCOUNTER — Other Ambulatory Visit: Payer: Self-pay | Admitting: Podiatry

## 2019-05-24 NOTE — Progress Notes (Signed)
Subjective: April Dyer is a 49 y.o. is seen today in office s/p left bunionectomy preformed on 05/04/2019 with Dr. Paulla Dolly. They state their pain is old.  She has been wearing the offloading shoe and denies any recent injury or falls. Denies any systemic complaints such as fevers, chills, nausea, vomiting. No calf pain, chest pain, shortness of breath.   Objective: General: No acute distress, AAOx3  DP/PT pulses palpable 2/4, CRT < 3 sec to all digits.  Protective sensation intact. Motor function intact.  LEFT foot: Incision is well coapted without any evidence of dehiscence with sutures and is intact. There is no surrounding erythema, ascending cellulitis, fluctuance, crepitus, malodor, drainage/purulence. There is mild edema around the surgical site. There is minimal pain along the surgical site.  Toes in rectus position No other areas of tenderness to bilateral lower extremities.  No other open lesions or pre-ulcerative lesions.  No pain with calf compression, swelling, warmth, erythema.   Assessment and Plan:  Status post left uncinectomy, doing well with no complications   -Treatment options discussed including all alternatives, risks, and complications -X-rays obtained reviewed.  First metatarsal osteotomy with hardware intact without any complicating factors and no evidence of acute fracture -Antibiotic ointment and a bandage applied.  She can start to get the incision wet.  Dry thoroughly and apply a small amount of antibiotic ointment. -Related offloading shoe -Ice/elevation -Pain medication as needed. -Monitor for any clinical signs or symptoms of infection and DVT/PE and directed to call the office immediately should any occur or go to the ER. -Follow-up as scheduled with Dr. Paulla Dolly or sooner if any problems arise. In the meantime, encouraged to call the office with any questions, concerns, change in symptoms.   Celesta Gentile, DPM

## 2019-06-01 DIAGNOSIS — Z124 Encounter for screening for malignant neoplasm of cervix: Secondary | ICD-10-CM | POA: Diagnosis not present

## 2019-06-04 ENCOUNTER — Other Ambulatory Visit: Payer: Self-pay

## 2019-06-04 ENCOUNTER — Ambulatory Visit (INDEPENDENT_AMBULATORY_CARE_PROVIDER_SITE_OTHER): Payer: 59

## 2019-06-04 ENCOUNTER — Ambulatory Visit (INDEPENDENT_AMBULATORY_CARE_PROVIDER_SITE_OTHER): Payer: 59 | Admitting: Podiatry

## 2019-06-04 ENCOUNTER — Encounter: Payer: Self-pay | Admitting: Podiatry

## 2019-06-04 VITALS — Temp 97.2°F

## 2019-06-04 DIAGNOSIS — Z9889 Other specified postprocedural states: Secondary | ICD-10-CM

## 2019-06-04 DIAGNOSIS — M2012 Hallux valgus (acquired), left foot: Secondary | ICD-10-CM

## 2019-06-09 DIAGNOSIS — R69 Illness, unspecified: Secondary | ICD-10-CM | POA: Diagnosis not present

## 2019-06-10 DIAGNOSIS — R2689 Other abnormalities of gait and mobility: Secondary | ICD-10-CM | POA: Diagnosis not present

## 2019-06-10 DIAGNOSIS — M25675 Stiffness of left foot, not elsewhere classified: Secondary | ICD-10-CM | POA: Diagnosis not present

## 2019-06-10 DIAGNOSIS — M25572 Pain in left ankle and joints of left foot: Secondary | ICD-10-CM | POA: Diagnosis not present

## 2019-06-10 DIAGNOSIS — R262 Difficulty in walking, not elsewhere classified: Secondary | ICD-10-CM | POA: Diagnosis not present

## 2019-06-15 DIAGNOSIS — R2689 Other abnormalities of gait and mobility: Secondary | ICD-10-CM | POA: Diagnosis not present

## 2019-06-15 DIAGNOSIS — M25674 Stiffness of right foot, not elsewhere classified: Secondary | ICD-10-CM | POA: Diagnosis not present

## 2019-06-15 DIAGNOSIS — R262 Difficulty in walking, not elsewhere classified: Secondary | ICD-10-CM | POA: Diagnosis not present

## 2019-06-15 DIAGNOSIS — M25675 Stiffness of left foot, not elsewhere classified: Secondary | ICD-10-CM | POA: Diagnosis not present

## 2019-06-15 DIAGNOSIS — M25572 Pain in left ankle and joints of left foot: Secondary | ICD-10-CM | POA: Diagnosis not present

## 2019-06-15 DIAGNOSIS — M25571 Pain in right ankle and joints of right foot: Secondary | ICD-10-CM | POA: Diagnosis not present

## 2019-06-15 NOTE — Progress Notes (Signed)
Subjective:   Patient ID: April Dyer, female   DOB: 49 y.o.   MRN: [id number]   HPI Patient states overall doing well and feeling much better and I wish my other surgery went as well as this 1    ROS      Objective:  Physical Exam  Neurovascular status intact negative Bevelyn Buckles' sign noted with left foot healing well wound edges well coapted hallux in rectus position     Assessment:  Doing well post osteotomy first metatarsal left     Plan:  H&P x-ray reviewed and recommended the continuation of conservative care elevation compression and patient will be seen back to recheck.  I did note mild restriction of the joint surface so I did go ahead and refer to physical therapy for range of motion exercises  X-rays indicate osteotomies healing well fixation in place joint congruence

## 2019-06-17 DIAGNOSIS — R2689 Other abnormalities of gait and mobility: Secondary | ICD-10-CM | POA: Diagnosis not present

## 2019-06-17 DIAGNOSIS — M25675 Stiffness of left foot, not elsewhere classified: Secondary | ICD-10-CM | POA: Diagnosis not present

## 2019-06-17 DIAGNOSIS — M25571 Pain in right ankle and joints of right foot: Secondary | ICD-10-CM | POA: Diagnosis not present

## 2019-06-17 DIAGNOSIS — M25674 Stiffness of right foot, not elsewhere classified: Secondary | ICD-10-CM | POA: Diagnosis not present

## 2019-06-17 DIAGNOSIS — M25572 Pain in left ankle and joints of left foot: Secondary | ICD-10-CM | POA: Diagnosis not present

## 2019-06-17 DIAGNOSIS — R262 Difficulty in walking, not elsewhere classified: Secondary | ICD-10-CM | POA: Diagnosis not present

## 2019-06-21 DIAGNOSIS — R69 Illness, unspecified: Secondary | ICD-10-CM | POA: Diagnosis not present

## 2019-06-24 DIAGNOSIS — Z1211 Encounter for screening for malignant neoplasm of colon: Secondary | ICD-10-CM | POA: Diagnosis not present

## 2019-06-29 DIAGNOSIS — R69 Illness, unspecified: Secondary | ICD-10-CM | POA: Diagnosis not present

## 2019-06-30 DIAGNOSIS — Z719 Counseling, unspecified: Secondary | ICD-10-CM | POA: Diagnosis not present

## 2019-06-30 DIAGNOSIS — E289 Ovarian dysfunction, unspecified: Secondary | ICD-10-CM | POA: Diagnosis not present

## 2019-07-01 DIAGNOSIS — L680 Hirsutism: Secondary | ICD-10-CM | POA: Insufficient documentation

## 2019-07-07 DIAGNOSIS — I10 Essential (primary) hypertension: Secondary | ICD-10-CM | POA: Diagnosis not present

## 2019-07-07 DIAGNOSIS — Z13228 Encounter for screening for other metabolic disorders: Secondary | ICD-10-CM | POA: Diagnosis not present

## 2019-07-07 DIAGNOSIS — R69 Illness, unspecified: Secondary | ICD-10-CM | POA: Diagnosis not present

## 2019-07-07 DIAGNOSIS — E039 Hypothyroidism, unspecified: Secondary | ICD-10-CM | POA: Diagnosis not present

## 2019-07-07 DIAGNOSIS — Z6836 Body mass index (BMI) 36.0-36.9, adult: Secondary | ICD-10-CM | POA: Diagnosis not present

## 2019-07-07 DIAGNOSIS — E669 Obesity, unspecified: Secondary | ICD-10-CM | POA: Diagnosis not present

## 2019-07-08 ENCOUNTER — Ambulatory Visit (INDEPENDENT_AMBULATORY_CARE_PROVIDER_SITE_OTHER): Payer: 59 | Admitting: Podiatry

## 2019-07-08 ENCOUNTER — Ambulatory Visit (INDEPENDENT_AMBULATORY_CARE_PROVIDER_SITE_OTHER): Payer: 59

## 2019-07-08 ENCOUNTER — Other Ambulatory Visit: Payer: Self-pay

## 2019-07-08 ENCOUNTER — Encounter: Payer: Self-pay | Admitting: Podiatry

## 2019-07-08 VITALS — Temp 97.4°F

## 2019-07-08 DIAGNOSIS — Z9889 Other specified postprocedural states: Secondary | ICD-10-CM | POA: Diagnosis not present

## 2019-07-08 DIAGNOSIS — K219 Gastro-esophageal reflux disease without esophagitis: Secondary | ICD-10-CM | POA: Diagnosis not present

## 2019-07-08 DIAGNOSIS — M2012 Hallux valgus (acquired), left foot: Secondary | ICD-10-CM

## 2019-07-08 DIAGNOSIS — R69 Illness, unspecified: Secondary | ICD-10-CM | POA: Diagnosis not present

## 2019-07-08 DIAGNOSIS — E039 Hypothyroidism, unspecified: Secondary | ICD-10-CM | POA: Diagnosis not present

## 2019-07-08 DIAGNOSIS — R630 Anorexia: Secondary | ICD-10-CM | POA: Diagnosis not present

## 2019-07-08 DIAGNOSIS — I10 Essential (primary) hypertension: Secondary | ICD-10-CM | POA: Diagnosis not present

## 2019-07-08 DIAGNOSIS — I119 Hypertensive heart disease without heart failure: Secondary | ICD-10-CM | POA: Diagnosis not present

## 2019-07-08 DIAGNOSIS — G43909 Migraine, unspecified, not intractable, without status migrainosus: Secondary | ICD-10-CM | POA: Diagnosis not present

## 2019-07-08 NOTE — Progress Notes (Signed)
Subjective:   Patient ID: April Dyer, female   DOB: 49 y.o.   MRN: [id number]   HPI Patient states overall doing well but I have been active on my foot and I do get some swelling towards the mid and end of the day.  My other foot still swells some and has improved gradually   ROS      Objective:  Physical Exam  Neurovascular status intact negative Bevelyn Buckles' sign was noted with patient found to still have some forefoot edema left but she is wearing a normal shoe currently and has excellent range of motion with no crepitus of the joint good reduction of the deformity with incision site healing well     Assessment:  Overall doing well with mild swelling of the forefoot left     Plan:  H&P reviewed condition and recommended continuation of conservative treatment.  At this point I did discuss there is some secondary bone healing but it does appear uneventful and should heal and possibly distal pin which may be removed but will get a look at that as we move on and so far everything looks good but I still want her to be careful and not do any type of excessive activity and do not walk barefoot currently  X-rays indicate there is some stress on the osteotomy dorsal but it is holding at this position with slight distal displacement of the calf

## 2019-07-09 DIAGNOSIS — R69 Illness, unspecified: Secondary | ICD-10-CM | POA: Diagnosis not present

## 2019-07-14 DIAGNOSIS — R69 Illness, unspecified: Secondary | ICD-10-CM | POA: Diagnosis not present

## 2019-07-16 DIAGNOSIS — Z130 Encounter for screening for diseases of the blood and blood-forming organs and certain disorders involving the immune mechanism: Secondary | ICD-10-CM | POA: Diagnosis not present

## 2019-07-16 DIAGNOSIS — E289 Ovarian dysfunction, unspecified: Secondary | ICD-10-CM | POA: Diagnosis not present

## 2019-07-16 DIAGNOSIS — Z1389 Encounter for screening for other disorder: Secondary | ICD-10-CM | POA: Diagnosis not present

## 2019-07-16 DIAGNOSIS — N911 Secondary amenorrhea: Secondary | ICD-10-CM | POA: Diagnosis not present

## 2019-07-16 DIAGNOSIS — Z118 Encounter for screening for other infectious and parasitic diseases: Secondary | ICD-10-CM | POA: Diagnosis not present

## 2019-07-16 DIAGNOSIS — Z113 Encounter for screening for infections with a predominantly sexual mode of transmission: Secondary | ICD-10-CM | POA: Diagnosis not present

## 2019-07-16 DIAGNOSIS — R69 Illness, unspecified: Secondary | ICD-10-CM | POA: Diagnosis not present

## 2019-07-16 DIAGNOSIS — Z0183 Encounter for blood typing: Secondary | ICD-10-CM | POA: Diagnosis not present

## 2019-07-21 DIAGNOSIS — R69 Illness, unspecified: Secondary | ICD-10-CM | POA: Diagnosis not present

## 2019-07-26 DIAGNOSIS — N978 Female infertility of other origin: Secondary | ICD-10-CM | POA: Diagnosis not present

## 2019-07-26 DIAGNOSIS — Z3141 Encounter for fertility testing: Secondary | ICD-10-CM | POA: Diagnosis not present

## 2019-07-27 DIAGNOSIS — R2689 Other abnormalities of gait and mobility: Secondary | ICD-10-CM | POA: Diagnosis not present

## 2019-07-27 DIAGNOSIS — M25571 Pain in right ankle and joints of right foot: Secondary | ICD-10-CM | POA: Diagnosis not present

## 2019-07-27 DIAGNOSIS — R262 Difficulty in walking, not elsewhere classified: Secondary | ICD-10-CM | POA: Diagnosis not present

## 2019-07-27 DIAGNOSIS — M25572 Pain in left ankle and joints of left foot: Secondary | ICD-10-CM | POA: Diagnosis not present

## 2019-07-27 DIAGNOSIS — M25675 Stiffness of left foot, not elsewhere classified: Secondary | ICD-10-CM | POA: Diagnosis not present

## 2019-07-27 DIAGNOSIS — M25674 Stiffness of right foot, not elsewhere classified: Secondary | ICD-10-CM | POA: Diagnosis not present

## 2019-07-30 DIAGNOSIS — R2689 Other abnormalities of gait and mobility: Secondary | ICD-10-CM | POA: Diagnosis not present

## 2019-07-30 DIAGNOSIS — R262 Difficulty in walking, not elsewhere classified: Secondary | ICD-10-CM | POA: Diagnosis not present

## 2019-07-30 DIAGNOSIS — M25675 Stiffness of left foot, not elsewhere classified: Secondary | ICD-10-CM | POA: Diagnosis not present

## 2019-07-30 DIAGNOSIS — R69 Illness, unspecified: Secondary | ICD-10-CM | POA: Diagnosis not present

## 2019-07-30 DIAGNOSIS — M25674 Stiffness of right foot, not elsewhere classified: Secondary | ICD-10-CM | POA: Diagnosis not present

## 2019-07-30 DIAGNOSIS — M25572 Pain in left ankle and joints of left foot: Secondary | ICD-10-CM | POA: Diagnosis not present

## 2019-07-30 DIAGNOSIS — M25571 Pain in right ankle and joints of right foot: Secondary | ICD-10-CM | POA: Diagnosis not present

## 2019-08-02 DIAGNOSIS — Z6835 Body mass index (BMI) 35.0-35.9, adult: Secondary | ICD-10-CM | POA: Diagnosis not present

## 2019-08-02 DIAGNOSIS — E669 Obesity, unspecified: Secondary | ICD-10-CM | POA: Diagnosis not present

## 2019-08-02 DIAGNOSIS — I10 Essential (primary) hypertension: Secondary | ICD-10-CM | POA: Diagnosis not present

## 2019-08-02 DIAGNOSIS — E039 Hypothyroidism, unspecified: Secondary | ICD-10-CM | POA: Diagnosis not present

## 2019-08-02 DIAGNOSIS — O09529 Supervision of elderly multigravida, unspecified trimester: Secondary | ICD-10-CM | POA: Diagnosis not present

## 2019-08-12 ENCOUNTER — Other Ambulatory Visit: Payer: Self-pay

## 2019-08-12 ENCOUNTER — Ambulatory Visit (INDEPENDENT_AMBULATORY_CARE_PROVIDER_SITE_OTHER): Payer: 59

## 2019-08-12 ENCOUNTER — Encounter: Payer: Self-pay | Admitting: Podiatry

## 2019-08-12 ENCOUNTER — Ambulatory Visit (INDEPENDENT_AMBULATORY_CARE_PROVIDER_SITE_OTHER): Payer: 59 | Admitting: Podiatry

## 2019-08-12 VITALS — Temp 97.3°F

## 2019-08-12 DIAGNOSIS — M2012 Hallux valgus (acquired), left foot: Secondary | ICD-10-CM

## 2019-08-12 DIAGNOSIS — R69 Illness, unspecified: Secondary | ICD-10-CM | POA: Diagnosis not present

## 2019-08-16 NOTE — Progress Notes (Signed)
Subjective:   Patient ID: April Dyer, female   DOB: 50 y.o.   MRN: [id number]   HPI Patient presents stating I am doing well with swelling if I am on my foot too long but overall this foot is done better than the other one and I am satisfied with correction at this time   ROS      Objective:  Physical Exam  Neurovascular status intact negative Bevelyn Buckles' sign noted with patient's left foot healing well with good range of motion with patient having had some secondary bone movement but it appears to have stabilized with incision site healing well wound edges well coapted     Assessment:  Overall seems to be doing well post osteotomy first metatarsal left with patient having had movement postoperatively but it appears to be consolidated and healing uneventfully currently     Plan:  H&P reviewed x-rays and at this point I have recommended the continuation of range of motion the continuation of anti-inflammatories physical therapy and support.  Patient will be seen back for me to recheck again in the next 6 weeks but overall I am very pleased  X-rays indicate there has been secondary bone healing around the first MPJ left but it appears relatively stable at this point

## 2019-09-03 DIAGNOSIS — R69 Illness, unspecified: Secondary | ICD-10-CM | POA: Diagnosis not present

## 2019-09-17 DIAGNOSIS — R69 Illness, unspecified: Secondary | ICD-10-CM | POA: Diagnosis not present

## 2019-09-24 DIAGNOSIS — R69 Illness, unspecified: Secondary | ICD-10-CM | POA: Diagnosis not present

## 2019-10-01 DIAGNOSIS — I119 Hypertensive heart disease without heart failure: Secondary | ICD-10-CM | POA: Diagnosis not present

## 2019-10-01 DIAGNOSIS — I10 Essential (primary) hypertension: Secondary | ICD-10-CM | POA: Diagnosis not present

## 2019-10-07 ENCOUNTER — Ambulatory Visit (INDEPENDENT_AMBULATORY_CARE_PROVIDER_SITE_OTHER): Payer: No Typology Code available for payment source

## 2019-10-07 ENCOUNTER — Telehealth: Payer: Self-pay | Admitting: *Deleted

## 2019-10-07 ENCOUNTER — Ambulatory Visit (INDEPENDENT_AMBULATORY_CARE_PROVIDER_SITE_OTHER): Payer: No Typology Code available for payment source | Admitting: Podiatry

## 2019-10-07 ENCOUNTER — Other Ambulatory Visit: Payer: Self-pay

## 2019-10-07 ENCOUNTER — Encounter: Payer: Self-pay | Admitting: Podiatry

## 2019-10-07 VITALS — Temp 97.3°F

## 2019-10-07 DIAGNOSIS — M05771 Rheumatoid arthritis with rheumatoid factor of right ankle and foot without organ or systems involvement: Secondary | ICD-10-CM | POA: Diagnosis not present

## 2019-10-07 DIAGNOSIS — M2011 Hallux valgus (acquired), right foot: Secondary | ICD-10-CM | POA: Diagnosis not present

## 2019-10-07 DIAGNOSIS — M05772 Rheumatoid arthritis with rheumatoid factor of left ankle and foot without organ or systems involvement: Secondary | ICD-10-CM | POA: Diagnosis not present

## 2019-10-07 DIAGNOSIS — M2012 Hallux valgus (acquired), left foot: Secondary | ICD-10-CM

## 2019-10-07 DIAGNOSIS — R600 Localized edema: Secondary | ICD-10-CM

## 2019-10-07 DIAGNOSIS — M779 Enthesopathy, unspecified: Secondary | ICD-10-CM

## 2019-10-07 MED ORDER — PREDNISONE 10 MG PO TABS
ORAL_TABLET | ORAL | 0 refills | Status: DC
Start: 2019-10-07 — End: 2020-01-28

## 2019-10-07 NOTE — Telephone Encounter (Signed)
Faxed orders to [facility]. 

## 2019-10-08 LAB — CBC WITH DIFFERENTIAL/PLATELET
Absolute Monocytes: 632 cells/uL (ref 200–950)
Basophils Absolute: 39 cells/uL (ref 0–200)
Basophils Relative: 0.5 %
Eosinophils Absolute: 148 cells/uL (ref 15–500)
Eosinophils Relative: 1.9 %
HCT: 38.2 % (ref 35.0–45.0)
Hemoglobin: 12.8 g/dL (ref 11.7–15.5)
Lymphs Abs: 2083 cells/uL (ref 850–3900)
MCH: 31.6 pg (ref 27.0–33.0)
MCHC: 33.5 g/dL (ref 32.0–36.0)
MCV: 94.3 fL (ref 80.0–100.0)
MPV: 11.2 fL (ref 7.5–12.5)
Monocytes Relative: 8.1 %
Neutro Abs: 4898 cells/uL (ref 1500–7800)
Neutrophils Relative %: 62.8 %
Platelets: 246 10*3/uL (ref 140–400)
RBC: 4.05 10*6/uL (ref 3.80–5.10)
RDW: 13.3 % (ref 11.0–15.0)
Total Lymphocyte: 26.7 %
WBC: 7.8 10*3/uL (ref 3.8–10.8)

## 2019-10-08 LAB — BASIC METABOLIC PANEL
BUN: 19 mg/dL (ref 7–25)
CO2: 23 mmol/L (ref 20–32)
Calcium: 9.1 mg/dL (ref 8.6–10.4)
Chloride: 110 mmol/L (ref 98–110)
Creat: 0.99 mg/dL (ref 0.50–1.05)
Glucose, Bld: 86 mg/dL (ref 65–99)
Potassium: 3.9 mmol/L (ref 3.5–5.3)
Sodium: 142 mmol/L (ref 135–146)

## 2019-10-08 LAB — C-REACTIVE PROTEIN: CRP: 3.1 mg/L (ref <8.0)

## 2019-10-08 LAB — ANA, IFA COMPREHENSIVE PANEL
Anti Nuclear Antibody (ANA): NEGATIVE
ENA SM Ab Ser-aCnc: <1 AI
SM/RNP: <1 AI
SSA (Ro) (ENA) Antibody, IgG: <1 AI
SSB (La) (ENA) Antibody, IgG: <1 AI
Scleroderma (Scl-70) (ENA) Antibody, IgG: 3.2 AI — AB
ds DNA Ab: <1 IU/mL

## 2019-10-08 LAB — URIC ACID: Uric Acid, Serum: 5.6 mg/dL (ref 2.5–7.0)

## 2019-10-08 LAB — RHEUMATOID FACTOR: Rheumatoid fact SerPl-aCnc: <14 IU/mL (ref <14)

## 2019-10-08 LAB — SEDIMENTATION RATE: Sed Rate: 2 mm/h (ref 0–20)

## 2019-10-08 NOTE — Progress Notes (Signed)
Subjective:   Patient ID: April Dyer, female   DOB: 49 y.o.   MRN: [id number]   HPI Patient presents stating over the last couple weeks my feet have started to swell again and both of them have been swollen.  Patient does not remember any reason and states that it seems she was slowly improving but then this started up again   ROS      Objective:  Physical Exam  Neurovascular status unchanged with a negative Bevelyn Buckles' sign but quite a bit of edema in the forefoot midfoot bilateral nonpitting in nature.  The incision sites on the first metatarsal bilateral have healed well the range of motion of the first MPJ is good and the pathology appears to be more diffuse than it does appear to be directly in this area     Assessment:  Edema that has appeared again and is hard to identify as to the etiology of it with well-healed surgical sites bilateral     Plan:  H&P x-ray reviewed and at this point I am sending to rule out DVTs and I am sending for blood work to try to understand if there is any inflammatory condition that could be causing this.  I did place on a Sterapred DS Dosepak to try to reduce the inflammatory component advised on compression elevation and I want to see her back again in the next couple weeks  X-rays did indicate that the distal pad of the left has moved in a distal direction but I do not think that is part of his current pathologies she is experiencing and the bone does appear to be healing well bilateral with good alignment noted and good correction of underlying deformity

## 2019-10-11 ENCOUNTER — Other Ambulatory Visit: Payer: Self-pay

## 2019-10-11 ENCOUNTER — Ambulatory Visit (HOSPITAL_COMMUNITY)
Admission: RE | Admit: 2019-10-11 | Discharge: 2019-10-11 | Disposition: A | Discharge status: Home / Self Care | Payer: No Typology Code available for payment source | Source: Ambulatory Visit | Attending: Internal Medicine | Admitting: Internal Medicine

## 2019-10-11 DIAGNOSIS — R600 Localized edema: Secondary | ICD-10-CM | POA: Diagnosis not present

## 2019-10-14 DIAGNOSIS — E039 Hypothyroidism, unspecified: Secondary | ICD-10-CM | POA: Diagnosis not present

## 2019-10-14 DIAGNOSIS — K219 Gastro-esophageal reflux disease without esophagitis: Secondary | ICD-10-CM | POA: Diagnosis not present

## 2019-10-14 DIAGNOSIS — R69 Illness, unspecified: Secondary | ICD-10-CM | POA: Diagnosis not present

## 2019-10-14 DIAGNOSIS — R630 Anorexia: Secondary | ICD-10-CM | POA: Diagnosis not present

## 2019-10-14 DIAGNOSIS — I119 Hypertensive heart disease without heart failure: Secondary | ICD-10-CM | POA: Diagnosis not present

## 2019-10-14 DIAGNOSIS — G43909 Migraine, unspecified, not intractable, without status migrainosus: Secondary | ICD-10-CM | POA: Diagnosis not present

## 2019-10-14 DIAGNOSIS — I10 Essential (primary) hypertension: Secondary | ICD-10-CM | POA: Diagnosis not present

## 2019-11-01 DIAGNOSIS — Z20828 Contact with and (suspected) exposure to other viral communicable diseases: Secondary | ICD-10-CM | POA: Diagnosis not present

## 2019-11-23 DIAGNOSIS — E039 Hypothyroidism, unspecified: Secondary | ICD-10-CM | POA: Diagnosis not present

## 2019-11-23 DIAGNOSIS — R630 Anorexia: Secondary | ICD-10-CM | POA: Diagnosis not present

## 2019-11-23 DIAGNOSIS — I119 Hypertensive heart disease without heart failure: Secondary | ICD-10-CM | POA: Diagnosis not present

## 2019-11-23 DIAGNOSIS — K219 Gastro-esophageal reflux disease without esophagitis: Secondary | ICD-10-CM | POA: Diagnosis not present

## 2019-11-23 DIAGNOSIS — R69 Illness, unspecified: Secondary | ICD-10-CM | POA: Diagnosis not present

## 2019-11-23 DIAGNOSIS — G43909 Migraine, unspecified, not intractable, without status migrainosus: Secondary | ICD-10-CM | POA: Diagnosis not present

## 2019-11-23 DIAGNOSIS — R1114 Bilious vomiting: Secondary | ICD-10-CM | POA: Diagnosis not present

## 2019-11-23 DIAGNOSIS — I10 Essential (primary) hypertension: Secondary | ICD-10-CM | POA: Diagnosis not present

## 2019-11-26 ENCOUNTER — Telehealth: Payer: Self-pay | Admitting: Gastroenterology

## 2019-11-26 DIAGNOSIS — Z20822 Contact with and (suspected) exposure to covid-19: Secondary | ICD-10-CM | POA: Diagnosis not present

## 2019-11-26 NOTE — Telephone Encounter (Signed)
Hi Dr. Silverio Decamp,  We received a  Referral from PCP for patient to be seen for nausea, and vomiting.  Patient is currently with [facility] and would like to transfer care over to [facility] because she is unhappy with treatment.   Please advise on approval for scheduling.   Thank you

## 2019-11-30 DIAGNOSIS — I10 Essential (primary) hypertension: Secondary | ICD-10-CM | POA: Diagnosis not present

## 2019-11-30 DIAGNOSIS — R69 Illness, unspecified: Secondary | ICD-10-CM | POA: Diagnosis not present

## 2019-11-30 DIAGNOSIS — N289 Disorder of kidney and ureter, unspecified: Secondary | ICD-10-CM | POA: Diagnosis not present

## 2019-11-30 DIAGNOSIS — E039 Hypothyroidism, unspecified: Secondary | ICD-10-CM | POA: Diagnosis not present

## 2019-11-30 DIAGNOSIS — I119 Hypertensive heart disease without heart failure: Secondary | ICD-10-CM | POA: Diagnosis not present

## 2019-11-30 DIAGNOSIS — R0602 Shortness of breath: Secondary | ICD-10-CM | POA: Diagnosis not present

## 2019-11-30 DIAGNOSIS — K219 Gastro-esophageal reflux disease without esophagitis: Secondary | ICD-10-CM | POA: Diagnosis not present

## 2019-11-30 DIAGNOSIS — G43909 Migraine, unspecified, not intractable, without status migrainosus: Secondary | ICD-10-CM | POA: Diagnosis not present

## 2019-12-03 DIAGNOSIS — R0602 Shortness of breath: Secondary | ICD-10-CM | POA: Diagnosis not present

## 2019-12-03 DIAGNOSIS — E039 Hypothyroidism, unspecified: Secondary | ICD-10-CM | POA: Diagnosis not present

## 2019-12-03 DIAGNOSIS — I119 Hypertensive heart disease without heart failure: Secondary | ICD-10-CM | POA: Diagnosis not present

## 2019-12-03 DIAGNOSIS — G43909 Migraine, unspecified, not intractable, without status migrainosus: Secondary | ICD-10-CM | POA: Diagnosis not present

## 2019-12-03 DIAGNOSIS — N289 Disorder of kidney and ureter, unspecified: Secondary | ICD-10-CM | POA: Diagnosis not present

## 2019-12-03 DIAGNOSIS — K219 Gastro-esophageal reflux disease without esophagitis: Secondary | ICD-10-CM | POA: Diagnosis not present

## 2019-12-03 DIAGNOSIS — R69 Illness, unspecified: Secondary | ICD-10-CM | POA: Diagnosis not present

## 2019-12-03 DIAGNOSIS — I10 Essential (primary) hypertension: Secondary | ICD-10-CM | POA: Diagnosis not present

## 2019-12-14 DIAGNOSIS — R69 Illness, unspecified: Secondary | ICD-10-CM | POA: Diagnosis not present

## 2019-12-23 DIAGNOSIS — N979 Female infertility, unspecified: Secondary | ICD-10-CM | POA: Diagnosis not present

## 2020-01-04 DIAGNOSIS — Z3183 Encounter for assisted reproductive fertility procedure cycle: Secondary | ICD-10-CM | POA: Diagnosis not present

## 2020-01-13 DIAGNOSIS — G43909 Migraine, unspecified, not intractable, without status migrainosus: Secondary | ICD-10-CM | POA: Diagnosis not present

## 2020-01-13 DIAGNOSIS — E039 Hypothyroidism, unspecified: Secondary | ICD-10-CM | POA: Diagnosis not present

## 2020-01-13 DIAGNOSIS — K219 Gastro-esophageal reflux disease without esophagitis: Secondary | ICD-10-CM | POA: Diagnosis not present

## 2020-01-13 DIAGNOSIS — I119 Hypertensive heart disease without heart failure: Secondary | ICD-10-CM | POA: Diagnosis not present

## 2020-01-13 DIAGNOSIS — R69 Illness, unspecified: Secondary | ICD-10-CM | POA: Diagnosis not present

## 2020-01-13 DIAGNOSIS — N289 Disorder of kidney and ureter, unspecified: Secondary | ICD-10-CM | POA: Diagnosis not present

## 2020-01-13 DIAGNOSIS — I10 Essential (primary) hypertension: Secondary | ICD-10-CM | POA: Diagnosis not present

## 2020-01-17 DIAGNOSIS — Z3201 Encounter for pregnancy test, result positive: Secondary | ICD-10-CM | POA: Diagnosis not present

## 2020-01-19 DIAGNOSIS — Z3201 Encounter for pregnancy test, result positive: Secondary | ICD-10-CM | POA: Diagnosis not present

## 2020-01-20 DIAGNOSIS — J040 Acute laryngitis: Secondary | ICD-10-CM | POA: Diagnosis not present

## 2020-01-20 DIAGNOSIS — R42 Dizziness and giddiness: Secondary | ICD-10-CM | POA: Diagnosis not present

## 2020-01-20 DIAGNOSIS — I10 Essential (primary) hypertension: Secondary | ICD-10-CM | POA: Diagnosis not present

## 2020-01-20 DIAGNOSIS — G43909 Migraine, unspecified, not intractable, without status migrainosus: Secondary | ICD-10-CM | POA: Diagnosis not present

## 2020-01-20 DIAGNOSIS — I119 Hypertensive heart disease without heart failure: Secondary | ICD-10-CM | POA: Diagnosis not present

## 2020-01-20 DIAGNOSIS — R0602 Shortness of breath: Secondary | ICD-10-CM | POA: Diagnosis not present

## 2020-01-20 DIAGNOSIS — N289 Disorder of kidney and ureter, unspecified: Secondary | ICD-10-CM | POA: Diagnosis not present

## 2020-01-20 DIAGNOSIS — E039 Hypothyroidism, unspecified: Secondary | ICD-10-CM | POA: Diagnosis not present

## 2020-01-20 DIAGNOSIS — K219 Gastro-esophageal reflux disease without esophagitis: Secondary | ICD-10-CM | POA: Diagnosis not present

## 2020-01-20 DIAGNOSIS — R69 Illness, unspecified: Secondary | ICD-10-CM | POA: Diagnosis not present

## 2020-01-21 DIAGNOSIS — O0901 Supervision of pregnancy with history of infertility, first trimester: Secondary | ICD-10-CM | POA: Diagnosis not present

## 2020-01-27 DIAGNOSIS — K219 Gastro-esophageal reflux disease without esophagitis: Secondary | ICD-10-CM | POA: Diagnosis not present

## 2020-01-27 DIAGNOSIS — J3489 Other specified disorders of nose and nasal sinuses: Secondary | ICD-10-CM | POA: Diagnosis not present

## 2020-01-27 DIAGNOSIS — R69 Illness, unspecified: Secondary | ICD-10-CM | POA: Diagnosis not present

## 2020-01-27 DIAGNOSIS — J343 Hypertrophy of nasal turbinates: Secondary | ICD-10-CM | POA: Diagnosis not present

## 2020-01-27 DIAGNOSIS — Z8616 Personal history of COVID-19: Secondary | ICD-10-CM | POA: Diagnosis not present

## 2020-01-28 ENCOUNTER — Ambulatory Visit (INDEPENDENT_AMBULATORY_CARE_PROVIDER_SITE_OTHER): Payer: No Typology Code available for payment source | Admitting: Critical Care Medicine

## 2020-01-28 ENCOUNTER — Encounter: Payer: Self-pay | Admitting: Critical Care Medicine

## 2020-01-28 ENCOUNTER — Other Ambulatory Visit: Payer: Self-pay

## 2020-01-28 VITALS — BP 128/82 | HR 80 | Ht 68.0 in | Wt 241.0 lb

## 2020-01-28 DIAGNOSIS — Z8616 Personal history of COVID-19: Secondary | ICD-10-CM | POA: Diagnosis not present

## 2020-01-28 DIAGNOSIS — R0602 Shortness of breath: Secondary | ICD-10-CM

## 2020-01-28 DIAGNOSIS — R059 Cough, unspecified: Secondary | ICD-10-CM

## 2020-01-28 MED ORDER — FLUTICASONE PROPIONATE 50 MCG/ACT NA SUSP
2.0000 | Freq: Every day | NASAL | 11 refills | Status: DC
Start: 2020-01-28 — End: 2020-06-13

## 2020-01-28 MED ORDER — PROAIR HFA 108 (90 BASE) MCG/ACT IN AERS: 1.0000 | INHALATION_SPRAY | RESPIRATORY_TRACT | 11 refills | Status: DC | PRN

## 2020-01-28 MED ORDER — BUDESONIDE-FORMOTEROL FUMARATE 160-4.5 MCG/ACT IN AERO
2.0000 | INHALATION_SPRAY | Freq: Two times a day (BID) | RESPIRATORY_TRACT | 11 refills | Status: DC
Start: 2020-01-28 — End: 2021-01-03
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
## 2020-01-28 NOTE — Patient Instructions (Addendum)
Thank you for visiting Dr. Carlis Abbott at [facility]. We recommend the following: Orders Placed This Encounter  Procedures  . Pulmonary function test   Orders Placed This Encounter  Procedures  . Pulmonary function test    Standing Status:   Future    Standing Expiration Date:   01/27/2021    Order Specific Question:   Where should this test be performed?    Answer:   [facility]    Order Specific Question:   Full PFT: includes the following: basic spirometry, spirometry pre & post bronchodilator, diffusion capacity (DLCO), lung volumes    Answer:   Full PFT    Use the flonase once per day. Use albuterol 1-2 puffs before using Symbicort 2 times per day. When you use your inhalers, try to hold your breath about 10 seconds to let the medicines deposit down in your lungs.  Meds ordered this encounter  Medications  . budesonide-formoterol (SYMBICORT) 160-4.5 MCG/ACT inhaler    Sig: Inhale 2 puffs into the lungs 2 (two) times daily.    Dispense:  1 each    Refill:  11  . PROAIR HFA 108 (90 Base) MCG/ACT inhaler    Sig: Inhale 1-2 puffs into the lungs every 4 (four) hours as needed for wheezing or shortness of breath.    Dispense:  1 each    Refill:  11  . fluticasone (FLONASE) 50 MCG/ACT nasal spray    Sig: Place 2 sprays into both nostrils daily.    Dispense:  16 g    Refill:  11    Return in about 1 month (around 02/27/2020). with Dr. Silas Flood (30 minutes visit).    Please do your part to reduce the spread of COVID-19.

## 2020-01-29 ENCOUNTER — Inpatient Hospital Stay (HOSPITAL_COMMUNITY): Payer: No Typology Code available for payment source

## 2020-01-29 ENCOUNTER — Encounter (HOSPITAL_COMMUNITY): Payer: Self-pay | Admitting: Obstetrics & Gynecology

## 2020-01-29 ENCOUNTER — Other Ambulatory Visit: Payer: Self-pay

## 2020-01-29 ENCOUNTER — Inpatient Hospital Stay (HOSPITAL_COMMUNITY)
Admission: AD | Admit: 2020-01-29 | Discharge: 2020-01-30 | Disposition: A | Discharge status: Home / Self Care | Payer: No Typology Code available for payment source | Attending: Obstetrics & Gynecology | Admitting: Obstetrics & Gynecology

## 2020-01-29 DIAGNOSIS — Z3A01 Less than 8 weeks gestation of pregnancy: Secondary | ICD-10-CM | POA: Insufficient documentation

## 2020-01-29 DIAGNOSIS — Z3490 Encounter for supervision of normal pregnancy, unspecified, unspecified trimester: Secondary | ICD-10-CM

## 2020-01-29 DIAGNOSIS — O208 Other hemorrhage in early pregnancy: Secondary | ICD-10-CM | POA: Diagnosis not present

## 2020-01-29 DIAGNOSIS — D259 Leiomyoma of uterus, unspecified: Secondary | ICD-10-CM | POA: Diagnosis not present

## 2020-01-29 DIAGNOSIS — O3411 Maternal care for benign tumor of corpus uteri, first trimester: Secondary | ICD-10-CM | POA: Diagnosis not present

## 2020-01-29 DIAGNOSIS — O468X1 Other antepartum hemorrhage, first trimester: Secondary | ICD-10-CM | POA: Diagnosis not present

## 2020-01-29 DIAGNOSIS — Z8673 Personal history of transient ischemic attack (TIA), and cerebral infarction without residual deficits: Secondary | ICD-10-CM | POA: Insufficient documentation

## 2020-01-29 DIAGNOSIS — O209 Hemorrhage in early pregnancy, unspecified: Secondary | ICD-10-CM

## 2020-01-29 LAB — CBC
HCT: 37.6 % (ref 36.0–46.0)
Hemoglobin: 12.6 g/dL (ref 12.0–15.0)
MCH: 32.4 pg (ref 26.0–34.0)
MCHC: 33.5 g/dL (ref 30.0–36.0)
MCV: 96.7 fL (ref 80.0–100.0)
Platelets: 240 10*3/uL (ref 150–400)
RBC: 3.89 MIL/uL (ref 3.87–5.11)
RDW: 13.2 % (ref 11.5–15.5)
WBC: 11.2 10*3/uL — ABNORMAL HIGH (ref 4.0–10.5)
nRBC: 0 % (ref 0.0–0.2)

## 2020-01-29 LAB — HCG, QUANTITATIVE, PREGNANCY: hCG, Beta Chain, Quant, S: 26970 m[IU]/mL — ABNORMAL HIGH (ref <5)

## 2020-01-29 LAB — POCT PREGNANCY, URINE: Preg Test, Ur: POSITIVE — AB

## 2020-01-29 NOTE — MAU Note (Signed)
IVF done 01/04/2020. Started bleeding tonight passed a quarter sized clot about an hour ago and has continued to bleed.Denies any pain or cramping at this time and no recent intercourse reported.

## 2020-01-30 DIAGNOSIS — O208 Other hemorrhage in early pregnancy: Secondary | ICD-10-CM | POA: Diagnosis not present

## 2020-01-30 DIAGNOSIS — D259 Leiomyoma of uterus, unspecified: Secondary | ICD-10-CM | POA: Diagnosis not present

## 2020-01-30 DIAGNOSIS — O3411 Maternal care for benign tumor of corpus uteri, first trimester: Secondary | ICD-10-CM | POA: Diagnosis not present

## 2020-01-30 DIAGNOSIS — Z3A01 Less than 8 weeks gestation of pregnancy: Secondary | ICD-10-CM

## 2020-01-30 DIAGNOSIS — O468X1 Other antepartum hemorrhage, first trimester: Secondary | ICD-10-CM
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
## 2020-01-30 NOTE — Discharge Instructions (Signed)

## 2020-01-31 DIAGNOSIS — Z1329 Encounter for screening for other suspected endocrine disorder: Secondary | ICD-10-CM | POA: Diagnosis not present

## 2020-01-31 DIAGNOSIS — O0901 Supervision of pregnancy with history of infertility, first trimester: Secondary | ICD-10-CM | POA: Diagnosis not present

## 2020-02-03 DIAGNOSIS — J385 Laryngeal spasm: Secondary | ICD-10-CM | POA: Diagnosis not present

## 2020-02-03 DIAGNOSIS — R490 Dysphonia: Secondary | ICD-10-CM | POA: Diagnosis not present

## 2020-02-03 DIAGNOSIS — Z8616 Personal history of COVID-19: Secondary | ICD-10-CM | POA: Diagnosis not present

## 2020-02-03 DIAGNOSIS — K219 Gastro-esophageal reflux disease without esophagitis: Secondary | ICD-10-CM | POA: Diagnosis not present

## 2020-02-03 DIAGNOSIS — R053 Chronic cough: Secondary | ICD-10-CM | POA: Diagnosis not present

## 2020-02-03 DIAGNOSIS — Z3A01 Less than 8 weeks gestation of pregnancy: Secondary | ICD-10-CM | POA: Diagnosis not present

## 2020-02-04 DIAGNOSIS — R69 Illness, unspecified: Secondary | ICD-10-CM | POA: Diagnosis not present

## 2020-02-07 DIAGNOSIS — J387 Other diseases of larynx: Secondary | ICD-10-CM | POA: Insufficient documentation

## 2020-02-07 DIAGNOSIS — U099 Post covid-19 condition, unspecified: Secondary | ICD-10-CM | POA: Insufficient documentation

## 2020-02-07 DIAGNOSIS — R490 Dysphonia: Secondary | ICD-10-CM | POA: Insufficient documentation

## 2020-02-07 DIAGNOSIS — J385 Laryngeal spasm: Secondary | ICD-10-CM | POA: Insufficient documentation

## 2020-02-07 DIAGNOSIS — J383 Other diseases of vocal cords: Secondary | ICD-10-CM | POA: Insufficient documentation

## 2020-02-10 DIAGNOSIS — R69 Illness, unspecified: Secondary | ICD-10-CM | POA: Diagnosis not present

## 2020-02-14 DIAGNOSIS — O0901 Supervision of pregnancy with history of infertility, first trimester: Secondary | ICD-10-CM | POA: Diagnosis not present

## 2020-02-16 ENCOUNTER — Telehealth: Payer: Self-pay | Admitting: Critical Care Medicine

## 2020-02-16 DIAGNOSIS — R69 Illness, unspecified: Secondary | ICD-10-CM | POA: Diagnosis not present

## 2020-02-16 NOTE — Telephone Encounter (Signed)
Rec'd request for medical records and restrictions. Form given to Jackson Latino to deliver to Dr. Carlis Abbott at [facility]. -pr

## 2020-02-21 NOTE — Telephone Encounter (Signed)
Rec'd signed form - Faxed to [employer] at [phone]. -pr

## 2020-02-22 DIAGNOSIS — O0901 Supervision of pregnancy with history of infertility, first trimester: Secondary | ICD-10-CM | POA: Diagnosis not present

## 2020-02-24 DIAGNOSIS — I119 Hypertensive heart disease without heart failure: Secondary | ICD-10-CM | POA: Diagnosis not present

## 2020-02-24 DIAGNOSIS — N289 Disorder of kidney and ureter, unspecified: Secondary | ICD-10-CM | POA: Diagnosis not present

## 2020-02-24 DIAGNOSIS — E669 Obesity, unspecified: Secondary | ICD-10-CM | POA: Diagnosis not present

## 2020-02-24 DIAGNOSIS — I10 Essential (primary) hypertension: Secondary | ICD-10-CM | POA: Diagnosis not present

## 2020-02-24 DIAGNOSIS — Z713 Dietary counseling and surveillance: Secondary | ICD-10-CM | POA: Diagnosis not present

## 2020-02-24 DIAGNOSIS — E039 Hypothyroidism, unspecified: Secondary | ICD-10-CM | POA: Diagnosis not present

## 2020-02-24 DIAGNOSIS — K219 Gastro-esophageal reflux disease without esophagitis: Secondary | ICD-10-CM | POA: Diagnosis not present

## 2020-02-24 DIAGNOSIS — R0602 Shortness of breath: Secondary | ICD-10-CM | POA: Diagnosis not present

## 2020-02-24 DIAGNOSIS — R69 Illness, unspecified: Secondary | ICD-10-CM | POA: Diagnosis not present

## 2020-02-24 DIAGNOSIS — G43909 Migraine, unspecified, not intractable, without status migrainosus: Secondary | ICD-10-CM | POA: Diagnosis not present

## 2020-02-24 DIAGNOSIS — Z6835 Body mass index (BMI) 35.0-35.9, adult: Secondary | ICD-10-CM | POA: Diagnosis not present

## 2020-02-24 DIAGNOSIS — E6609 Other obesity due to excess calories: Secondary | ICD-10-CM | POA: Diagnosis not present

## 2020-02-24 DIAGNOSIS — J040 Acute laryngitis: Secondary | ICD-10-CM | POA: Diagnosis not present

## 2020-02-29 DIAGNOSIS — B9721 SARS-associated coronavirus as the cause of diseases classified elsewhere: Secondary | ICD-10-CM | POA: Diagnosis not present

## 2020-02-29 DIAGNOSIS — R42 Dizziness and giddiness: Secondary | ICD-10-CM | POA: Diagnosis not present

## 2020-02-29 DIAGNOSIS — G4489 Other headache syndrome: Secondary | ICD-10-CM | POA: Diagnosis not present

## 2020-03-01 ENCOUNTER — Ambulatory Visit (INDEPENDENT_AMBULATORY_CARE_PROVIDER_SITE_OTHER): Payer: No Typology Code available for payment source | Admitting: Pulmonary Disease

## 2020-03-01 ENCOUNTER — Ambulatory Visit: Payer: No Typology Code available for payment source | Admitting: Critical Care Medicine

## 2020-03-01 ENCOUNTER — Other Ambulatory Visit: Payer: Self-pay

## 2020-03-01 ENCOUNTER — Encounter: Payer: Self-pay | Admitting: Pulmonary Disease

## 2020-03-01 VITALS — BP 140/80 | HR 88 | Temp 98.2°F | Ht 68.5 in | Wt 234.0 lb

## 2020-03-01 DIAGNOSIS — R0602 Shortness of breath: Secondary | ICD-10-CM

## 2020-03-01 DIAGNOSIS — R0683 Snoring: Secondary | ICD-10-CM | POA: Diagnosis not present

## 2020-03-01 DIAGNOSIS — G4733 Obstructive sleep apnea (adult) (pediatric): Secondary | ICD-10-CM | POA: Diagnosis not present

## 2020-03-01 DIAGNOSIS — Z8616 Personal history of COVID-19: Secondary | ICD-10-CM

## 2020-03-01 DIAGNOSIS — R0600 Dyspnea, unspecified: Secondary | ICD-10-CM | POA: Insufficient documentation
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
## 2020-03-01 NOTE — Assessment & Plan Note (Addendum)
Appears to be chronic problem post Covid infection, previously she only had mild intermittent asthma and hardly needed albuterol inhaler.  She is only using Symbicort intermittently and I asked her to use this on a maintenance basis twice daily.  I am not convinced this is asthma however.  Symptoms seem to predate pregnancy so I do not feel that this is pregnancy related worsening of asthma.  Low risk for VTE. She was visibly short of breath even while talking, on ambulation oxygen saturation does not seem to drop and heart rate remains stable with mild tachycardia.  Vocal cord dysfunction is a possibility especially given hoarseness, she has been evaluated by ENT already , only mild edema of the vocal cords was noted. No radiologic imaging has been performed so far due to her pregnancy PFTs are planned for later on today and we will review Anxiety is definitely a possibility, she seems to have significant stressors but appears to be motivated for this pregnancy

## 2020-03-01 NOTE — Assessment & Plan Note (Signed)
With her recurrent problems of daytime fatigue, snoring and weight gain I think it is reasonable to proceed with home sleep test to see if she has significant sleep disordered breathing especially in the light of pregnancy and age.  Previous home sleep test was negative however she states that she did not sleep at all during that study and its possible that this was a false negative study

## 2020-03-01 NOTE — Patient Instructions (Signed)
  Ambulatory saturation. Follow-up PFTs today. Take Symbicort 2 puffs twice daily, rinse mouth after use Okay to take albuterol on as-needed basis.  Schedule home sleep test

## 2020-03-01 NOTE — Progress Notes (Signed)
Full PFT completed today ? ?

## 2020-03-02 DIAGNOSIS — R69 Illness, unspecified: Secondary | ICD-10-CM | POA: Diagnosis not present

## 2020-03-06 LAB — PULMONARY FUNCTION TEST
DL/VA % pred: 114 %
DL/VA: 4.8 ml/min/mmHg/L
DLCO cor % pred: 90 %
DLCO cor: 21.69 ml/min/mmHg
DLCO unc % pred: 88 %
DLCO unc: 21.14 ml/min/mmHg
FEF 25-75 Post: 2.66 L/sec
FEF 25-75 Pre: 2.55 L/sec
FEF2575-%Change-Post: 4 %
FEF2575-%Pred-Post: 97 %
FEF2575-%Pred-Pre: 92 %
FEV1-%Change-Post: -1 %
FEV1-%Pred-Post: 97 %
FEV1-%Pred-Pre: 99 %
FEV1-Post: 2.64 L
FEV1-Pre: 2.67 L
FEV1FVC-%Change-Post: 2 %
FEV1FVC-%Pred-Pre: 97 %
FEV6-%Change-Post: -3 %
FEV6-%Pred-Post: 98 %
FEV6-%Pred-Pre: 101 %
FEV6-Post: 3.22 L
FEV6-Pre: 3.32 L
FEV6FVC-%Change-Post: 0 %
FEV6FVC-%Pred-Post: 101 %
FEV6FVC-%Pred-Pre: 101 %
FVC-%Change-Post: -3 %
FVC-%Pred-Post: 96 %
FVC-%Pred-Pre: 99 %
FVC-Post: 3.25 L
FVC-Pre: 3.35 L
Post FEV1/FVC ratio: 81 %
Post FEV6/FVC ratio: 99 %
Pre FEV1/FVC ratio: 80 %
Pre FEV6/FVC Ratio: 99 %
RV % pred: 93 %
RV: 1.85 L
TLC % pred: 87 %
TLC: 4.96 L

## 2020-03-09 DIAGNOSIS — R69 Illness, unspecified: Secondary | ICD-10-CM | POA: Diagnosis not present

## 2020-03-13 DIAGNOSIS — O09522 Supervision of elderly multigravida, second trimester: Secondary | ICD-10-CM | POA: Diagnosis not present

## 2020-03-16 DIAGNOSIS — R69 Illness, unspecified: Secondary | ICD-10-CM | POA: Diagnosis not present

## 2020-03-23 DIAGNOSIS — J385 Laryngeal spasm: Secondary | ICD-10-CM | POA: Diagnosis not present

## 2020-03-23 DIAGNOSIS — Z331 Pregnant state, incidental: Secondary | ICD-10-CM | POA: Diagnosis not present

## 2020-03-23 DIAGNOSIS — R053 Chronic cough: Secondary | ICD-10-CM | POA: Diagnosis not present

## 2020-03-23 DIAGNOSIS — U099 Post covid-19 condition, unspecified: Secondary | ICD-10-CM | POA: Diagnosis not present

## 2020-03-23 DIAGNOSIS — R490 Dysphonia: Secondary | ICD-10-CM | POA: Diagnosis not present

## 2020-03-23 DIAGNOSIS — J383 Other diseases of vocal cords: Secondary | ICD-10-CM | POA: Diagnosis not present

## 2020-03-24 DIAGNOSIS — O162 Unspecified maternal hypertension, second trimester: Secondary | ICD-10-CM | POA: Diagnosis not present

## 2020-03-24 DIAGNOSIS — O09529 Supervision of elderly multigravida, unspecified trimester: Secondary | ICD-10-CM | POA: Diagnosis not present

## 2020-03-24 DIAGNOSIS — E039 Hypothyroidism, unspecified: Secondary | ICD-10-CM | POA: Diagnosis not present

## 2020-03-24 DIAGNOSIS — O99211 Obesity complicating pregnancy, first trimester: Secondary | ICD-10-CM | POA: Diagnosis not present

## 2020-03-24 DIAGNOSIS — G4730 Sleep apnea, unspecified: Secondary | ICD-10-CM | POA: Diagnosis not present

## 2020-03-24 DIAGNOSIS — O99282 Endocrine, nutritional and metabolic diseases complicating pregnancy, second trimester: Secondary | ICD-10-CM | POA: Diagnosis not present

## 2020-03-24 DIAGNOSIS — U071 COVID-19: Secondary | ICD-10-CM | POA: Diagnosis not present

## 2020-03-24 DIAGNOSIS — O4690 Antepartum hemorrhage, unspecified, unspecified trimester: Secondary | ICD-10-CM | POA: Diagnosis not present

## 2020-03-24 DIAGNOSIS — O3429 Maternal care due to uterine scar from other previous surgery: Secondary | ICD-10-CM | POA: Diagnosis not present

## 2020-03-24 DIAGNOSIS — O09819 Supervision of pregnancy resulting from assisted reproductive technology, unspecified trimester: Secondary | ICD-10-CM | POA: Diagnosis not present

## 2020-03-24 LAB — OB RESULTS CONSOLE HEPATITIS B SURFACE ANTIGEN: Hepatitis B Surface Ag: NEGATIVE

## 2020-03-24 LAB — OB RESULTS CONSOLE RUBELLA ANTIBODY, IGM: Rubella: IMMUNE

## 2020-03-24 LAB — OB RESULTS CONSOLE ABO/RH: RH Type: POSITIVE

## 2020-03-24 LAB — OB RESULTS CONSOLE HIV ANTIBODY (ROUTINE TESTING): HIV: NONREACTIVE

## 2020-03-24 LAB — OB RESULTS CONSOLE ANTIBODY SCREEN: Antibody Screen: NEGATIVE

## 2020-03-24 LAB — OB RESULTS CONSOLE RPR: RPR: NONREACTIVE

## 2020-03-27 DIAGNOSIS — J040 Acute laryngitis: Secondary | ICD-10-CM | POA: Diagnosis not present

## 2020-03-27 DIAGNOSIS — R0602 Shortness of breath: Secondary | ICD-10-CM | POA: Diagnosis not present

## 2020-03-27 DIAGNOSIS — E039 Hypothyroidism, unspecified: Secondary | ICD-10-CM | POA: Diagnosis not present

## 2020-03-27 DIAGNOSIS — N289 Disorder of kidney and ureter, unspecified: Secondary | ICD-10-CM | POA: Diagnosis not present

## 2020-03-27 DIAGNOSIS — I10 Essential (primary) hypertension: Secondary | ICD-10-CM | POA: Diagnosis not present

## 2020-03-27 DIAGNOSIS — K219 Gastro-esophageal reflux disease without esophagitis: Secondary | ICD-10-CM | POA: Diagnosis not present

## 2020-03-27 DIAGNOSIS — G43909 Migraine, unspecified, not intractable, without status migrainosus: Secondary | ICD-10-CM | POA: Diagnosis not present

## 2020-03-27 DIAGNOSIS — I119 Hypertensive heart disease without heart failure: Secondary | ICD-10-CM | POA: Diagnosis not present

## 2020-03-27 DIAGNOSIS — R69 Illness, unspecified: Secondary | ICD-10-CM | POA: Diagnosis not present

## 2020-03-30 ENCOUNTER — Ambulatory Visit: Payer: No Typology Code available for payment source

## 2020-03-30 ENCOUNTER — Encounter: Payer: Self-pay | Admitting: Adult Health

## 2020-03-30 ENCOUNTER — Other Ambulatory Visit: Payer: Self-pay

## 2020-03-30 ENCOUNTER — Ambulatory Visit (INDEPENDENT_AMBULATORY_CARE_PROVIDER_SITE_OTHER): Payer: No Typology Code available for payment source | Admitting: Adult Health

## 2020-03-30 VITALS — BP 138/82 | HR 85 | Temp 97.4°F | Ht 68.0 in | Wt 232.8 lb

## 2020-03-30 DIAGNOSIS — G4733 Obstructive sleep apnea (adult) (pediatric): Secondary | ICD-10-CM

## 2020-03-30 DIAGNOSIS — R400 Somnolence: Secondary | ICD-10-CM | POA: Insufficient documentation

## 2020-03-30 DIAGNOSIS — R0600 Dyspnea, unspecified: Secondary | ICD-10-CM | POA: Diagnosis not present

## 2020-03-30 DIAGNOSIS — R053 Chronic cough: Secondary | ICD-10-CM

## 2020-03-30 DIAGNOSIS — R69 Illness, unspecified: Secondary | ICD-10-CM | POA: Diagnosis not present
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
## 2020-03-30 NOTE — Patient Instructions (Addendum)
Saline nasal rinses and gel As needed .  Flonase nasal As needed   Continue Symbicort 2 puffs Twice daily , rinse after use.  Obtain results of previous chest xray.  Voice rest , sips of water . No MINTS.  Avoid eating 2-3 hrs prior to bedtime.   2 D echo  Home sleep study .  Follow up with Dr. Vassie Loll or Kalayna Noy NP in 6 weeks and As needed   Please contact office for sooner follow up if symptoms do not improve or worsen or seek emergency care

## 2020-03-30 NOTE — Assessment & Plan Note (Addendum)
Ongoing dyspnea with associated decreased activity tolerance chronic fatigue, low energy and stamina.  These all developed post COVID. O2 saturations are 99%.  Pulmonary function testing was reviewed and normal with no airflow obstruction or restriction.  We will obtain previous chest x-ray results.  Pending those results we will decide if any further imaging is indicated.  Would like to hold off on additional x-rays at this time as she is pregnant. Will set up for a 2D echo to rule out possible underlying cardiac etiology as she is pregnant and previous COVID No signs of volume overload on exam.  Patient does have hypertension but appears to be controlled.  No previous history of preeclampsia. Lab work in November 2021 showed no evidence of anemia We will continue with treatment for underlying asthma advised to continue with activity as tolerated and advance as able.  Plan  Patient Instructions  Saline nasal rinses and gel As needed .  Flonase nasal As needed   Continue Symbicort 2 puffs Twice daily , rinse after use.  Obtain results of previous chest xray.  Voice rest , sips of water . No MINTS.  Avoid eating 2-3 hrs prior to bedtime.   2 D echo  Home sleep study .  Follow up with Dr. Vassie Loll or Shanna Strength NP in 6 weeks and As needed   Please contact office for sooner follow up if symptoms do not improve or worsen or seek emergency care

## 2020-03-30 NOTE — Assessment & Plan Note (Signed)
Post viral chronic cough.  Patient had ongoing cough since COVID-19.  Carries a diagnosis of asthma.  Slight improvement with decreased cough on Symbicort.  We will continue on current regimen.  Advised on inhaler oral care.  Pulmonary function testing showed normal lung function. Continue on cough control regimen with Delsym or Robitussin-DM as needed Continue with speech therapy follow-up

## 2020-03-30 NOTE — Assessment & Plan Note (Signed)
Hypersomnolence, restless sleep and fatigue.  Patient has risk factors for underlying sleep apnea.  Home sleep study is pending.  We have set up for home sleep study to be completed tonight.  We will follow-up on results.

## 2020-04-04 ENCOUNTER — Telehealth: Payer: Self-pay | Admitting: Pulmonary Disease

## 2020-04-04 DIAGNOSIS — G4710 Hypersomnia, unspecified: Secondary | ICD-10-CM | POA: Diagnosis not present

## 2020-04-04 NOTE — Telephone Encounter (Signed)
Called and spoke with pt letting her know the results of the HST and she verbalized understanding. Nothing further needed.

## 2020-04-04 NOTE — Telephone Encounter (Signed)
HST did not show significant OSA

## 2020-04-04 NOTE — Telephone Encounter (Signed)
Tried to call patient with HST results -  Dr. Elsworth Soho  Patient w/ HST results showing no significant sleep apnea.  Line disconnected.  Please let patient know no OSA on HST  Cont w/ w/up as planned and will discuss in detail on return ov.

## 2020-04-05 DIAGNOSIS — R69 Illness, unspecified: Secondary | ICD-10-CM | POA: Diagnosis not present

## 2020-04-07 DIAGNOSIS — R69 Illness, unspecified: Secondary | ICD-10-CM | POA: Diagnosis not present

## 2020-04-12 ENCOUNTER — Encounter: Payer: No Typology Code available for payment source | Attending: Obstetrics and Gynecology | Admitting: Registered"

## 2020-04-12 ENCOUNTER — Encounter: Payer: Self-pay | Admitting: Registered"

## 2020-04-12 ENCOUNTER — Ambulatory Visit: Payer: No Typology Code available for payment source | Admitting: Registered"

## 2020-04-12 ENCOUNTER — Telehealth: Payer: Self-pay | Admitting: Pulmonary Disease

## 2020-04-12 DIAGNOSIS — R69 Illness, unspecified: Secondary | ICD-10-CM | POA: Diagnosis not present

## 2020-04-12 DIAGNOSIS — O99212 Obesity complicating pregnancy, second trimester: Secondary | ICD-10-CM

## 2020-04-12 NOTE — Telephone Encounter (Signed)
ATC x1.  LM to return call. 

## 2020-04-12 NOTE — Progress Notes (Signed)
Medical Nutrition Therapy  Appointment Start time:  1500  Appointment End time:  [phone]  Primary concerns today: lost appetite with COVID, concerned she is starving her baby  Referral diagnosis: O99.212 Preferred learning style: no preference indicated Learning readiness: ready  NUTRITION ASSESSMENT   Anthropometrics  04/12/20   Pt states she now weighs 223 lbs Wt Readings from Last 3 Encounters:  03/30/20 232 lb 12.8 oz (105.6 kg)  03/01/20 234 lb (106.1 kg)  01/29/20 238 lb (108 kg)    Clinical Medical Hx: GERD, hypothyrodism,  Medications: reviewed Labs: reviewed Notable Signs/Symptoms: weight loss, appetite loss, difficulty digesting fiber and protien  Lifestyle & Dietary Hx Patient states she had COVID in August and although she tests negative now, still feeling the effects with difficulty breathing, fatigue and loss of appetite.  Patient reports she experiences early satiety and some food such as brown rice cause excessive bloating. Pt reports most meat is difficult to digest, but able to eat eggs, some ground beef and occasionally crab or lobster.  Pt states many foods taste too sweet, such as honey nut chex, which used to be her favorite cereal. Patient can drink small amount of 2%.  Pt states she is on leave from [employer] but they have been contacting her asking her to return to work.  Estimated daily fluid intake: Supplements: vitamins Sleep: 3 hrs, uses tylenol and delysm med to help relieve breathing and help sleep Stress / self-care: high Current average weekly physical activity: none  24-Hr Dietary Recall First Meal: pineapple OR fried egg with avocado Snack:  Second Meal:  Snack:  Third Meal: rice, okra, stewed tomatoes Snack:  Beverages: sweet tea, lemonade, 3 c water  Estimated Energy Needs Calories: 1900  NUTRITION DIAGNOSIS  NI-1.4 Inadequate energy intake As related to not meeting increased energy needs during pregnancy.  As evidenced by  dietary recall and weight loss during pregnancy.  NUTRITION INTERVENTION  Nutrition education (E-1) on the following topics:  . Importance of nutrient dense foods when eating small amount of foods . Foods that are more caloric dense to help with weight gain . Strategies to eat more often during the day  Handouts Provided Include   none  Learning Style & Readiness for Change Teaching method utilized: Visual & Auditory  Demonstrated degree of understanding via: Teach Back  Barriers to learning/adherence to lifestyle change: may be difficult with reduced appetite, fatigue  Goals Established by Pt Stash foods around the house so it will be easy for you to eat when you feel like you can. Some high calorie, nutrient dense foods to consider that it sounds like you can tolerate:   Season foods with oil. MCT oil is an easy to digest oil if you have trouble with other types of fat  Perfect Protein Bar, made with peanut butter, >300 calories in one bar. They are found in the refrigerated section but you can leave it out for a week https://perfectsnacks.com/collections/bars  Pinto beans seasoned with fat  Protein drinks, clear may be more tolerable  When your appetite comes back consider adding back more dark green vegetables into your diet.   MONITORING & EVALUATION Dietary intake, weekly physical activity, and weight in prn.  Next Steps  Patient is to work on goals. RD will check-in with patient next week to see if a follow-up will be needed.

## 2020-04-12 NOTE — Patient Instructions (Addendum)
Stash foods around the house so it will be easy for you to eat when you feel like you can. Some high calorie, nutrient dense foods to consider that it sounds like you can tolerate:   Season foods with oil. MCT oil is an easy to digest oil if you have trouble with other types of fat  Perfect Protein Bar, made with peanut butter, >300 calories in one bar. They are found in the refrigerated section but you can leave it out for a week https://perfectsnacks.com/collections/bars  Pinto beans seasoned with fat  Protein drinks, clear may be more tolerable  When your appetite comes back consider adding back more dark green vegetables into your diet.

## 2020-04-13 DIAGNOSIS — Z361 Encounter for antenatal screening for raised alphafetoprotein level: Secondary | ICD-10-CM | POA: Diagnosis not present

## 2020-04-14 DIAGNOSIS — O99212 Obesity complicating pregnancy, second trimester: Secondary | ICD-10-CM | POA: Insufficient documentation

## 2020-04-17 ENCOUNTER — Ambulatory Visit (HOSPITAL_COMMUNITY)
Admission: RE | Admit: 2020-04-17 | Discharge: 2020-04-17 | Disposition: A | Discharge status: Home / Self Care | Payer: No Typology Code available for payment source | Source: Ambulatory Visit | Attending: Adult Health | Admitting: Adult Health

## 2020-04-17 ENCOUNTER — Other Ambulatory Visit: Payer: Self-pay

## 2020-04-17 DIAGNOSIS — Z8616 Personal history of COVID-19: Secondary | ICD-10-CM | POA: Insufficient documentation

## 2020-04-17 DIAGNOSIS — R0600 Dyspnea, unspecified: Secondary | ICD-10-CM

## 2020-04-17 DIAGNOSIS — I119 Hypertensive heart disease without heart failure: Secondary | ICD-10-CM | POA: Diagnosis not present

## 2020-04-17 LAB — ECHOCARDIOGRAM COMPLETE
Area-P 1/2: 3.42 cm2
S' Lateral: 2.7 cm

## 2020-04-17 NOTE — Telephone Encounter (Signed)
What is this about.? Were any tests ordered ? TP saw last

## 2020-04-17 NOTE — Telephone Encounter (Signed)
LMTCB and this is the 2nd msg so will close encounter.

## 2020-04-17 NOTE — Progress Notes (Signed)
Echocardiogram 2D Echocardiogram has been performed.  Oneal Deputy Fallyn Munnerlyn 04/17/2020, 8:26 AM

## 2020-04-18 NOTE — Progress Notes (Signed)
Called and scheduled appointment with Dr Elsworth Soho for Wednesday, 04/26/2020 at 10:30am at the [facility] office. Patient agreeable to time, date and location. Will route to T. Parrett and Dr Elsworth Soho as Juluis Rainier.

## 2020-04-19 DIAGNOSIS — R519 Headache, unspecified: Secondary | ICD-10-CM | POA: Diagnosis not present

## 2020-04-19 DIAGNOSIS — R5383 Other fatigue: Secondary | ICD-10-CM | POA: Diagnosis not present

## 2020-04-19 DIAGNOSIS — G4459 Other complicated headache syndrome: Secondary | ICD-10-CM | POA: Diagnosis not present

## 2020-04-19 DIAGNOSIS — J341 Cyst and mucocele of nose and nasal sinus: Secondary | ICD-10-CM | POA: Diagnosis not present

## 2020-04-19 DIAGNOSIS — R42 Dizziness and giddiness: Secondary | ICD-10-CM | POA: Diagnosis not present

## 2020-04-25 DIAGNOSIS — O09529 Supervision of elderly multigravida, unspecified trimester: Secondary | ICD-10-CM | POA: Diagnosis not present

## 2020-04-25 DIAGNOSIS — O99212 Obesity complicating pregnancy, second trimester: Secondary | ICD-10-CM | POA: Diagnosis not present

## 2020-04-25 DIAGNOSIS — O99282 Endocrine, nutritional and metabolic diseases complicating pregnancy, second trimester: Secondary | ICD-10-CM | POA: Diagnosis not present

## 2020-04-25 DIAGNOSIS — O162 Unspecified maternal hypertension, second trimester: Secondary | ICD-10-CM | POA: Diagnosis not present

## 2020-04-25 DIAGNOSIS — O3429 Maternal care due to uterine scar from other previous surgery: Secondary | ICD-10-CM | POA: Diagnosis not present

## 2020-04-25 DIAGNOSIS — O09819 Supervision of pregnancy resulting from assisted reproductive technology, unspecified trimester: Secondary | ICD-10-CM | POA: Diagnosis not present

## 2020-04-25 DIAGNOSIS — Z3689 Encounter for other specified antenatal screening: Secondary | ICD-10-CM | POA: Diagnosis not present

## 2020-04-26 ENCOUNTER — Other Ambulatory Visit: Payer: Self-pay

## 2020-04-26 ENCOUNTER — Encounter: Payer: Self-pay | Admitting: Pulmonary Disease

## 2020-04-26 ENCOUNTER — Ambulatory Visit (INDEPENDENT_AMBULATORY_CARE_PROVIDER_SITE_OTHER): Payer: No Typology Code available for payment source | Admitting: Pulmonary Disease

## 2020-04-26 DIAGNOSIS — R0600 Dyspnea, unspecified: Secondary | ICD-10-CM

## 2020-04-26 DIAGNOSIS — I10 Essential (primary) hypertension: Secondary | ICD-10-CM | POA: Diagnosis not present

## 2020-04-26 DIAGNOSIS — R400 Somnolence: Secondary | ICD-10-CM

## 2020-04-26 DIAGNOSIS — R0609 Other forms of dyspnea: Secondary | ICD-10-CM

## 2020-04-26 NOTE — Assessment & Plan Note (Signed)
Not convinced this is true asthma, PFTs normal. Predates pregnancy. Continue Symbicort. Could be related to deconditioning or post Covid/long Covid syndrome. She needs to improve her exercise tolerance have asked her to obtain a pulse oximeter & aim. for heart rate of 126 which would be 80% of peak heart rate

## 2020-04-26 NOTE — Assessment & Plan Note (Signed)
Mild concentric LVH on echo. Needs aggressive blood pressure control has been started on labetalol already and this will be monitored by OB

## 2020-04-26 NOTE — Assessment & Plan Note (Signed)
Husband who has OSA objectively reports loud snoring and witnessed apneas.  Even though HST negative, she would benefit from CPAP therapy.  We will repeat HST, unfortunately in lab sleep testing may not be possible. I have asked him to set his auto CPAP machine to 5 to 12 cm and obtain AirFit N30 nasal mask

## 2020-04-26 NOTE — Patient Instructions (Signed)
   Agree with speech therapy for vocal cord dysfunction.  Stay on Symbicort. Please obtain pulse oximeter , with exercise/walking we would aim for heart rate no more than 125 -no rest when the heart rate reaches this level.  Repeat home sleep testing. Okay to trial auto CPAP 5 to 12 cm with nasal airfit N 30 mask
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
## 2020-04-28 DIAGNOSIS — R69 Illness, unspecified: Secondary | ICD-10-CM | POA: Diagnosis not present

## 2020-05-01 DIAGNOSIS — R69 Illness, unspecified: Secondary | ICD-10-CM | POA: Diagnosis not present

## 2020-05-05 DIAGNOSIS — R69 Illness, unspecified: Secondary | ICD-10-CM | POA: Diagnosis not present

## 2020-05-08 ENCOUNTER — Ambulatory Visit: Payer: No Typology Code available for payment source | Admitting: Gastroenterology

## 2020-05-08 DIAGNOSIS — R42 Dizziness and giddiness: Secondary | ICD-10-CM | POA: Diagnosis not present

## 2020-05-08 DIAGNOSIS — G4489 Other headache syndrome: Secondary | ICD-10-CM | POA: Diagnosis not present

## 2020-05-09 DIAGNOSIS — Z362 Encounter for other antenatal screening follow-up: Secondary | ICD-10-CM | POA: Diagnosis not present

## 2020-05-09 DIAGNOSIS — O09529 Supervision of elderly multigravida, unspecified trimester: Secondary | ICD-10-CM | POA: Diagnosis not present

## 2020-05-09 DIAGNOSIS — O99282 Endocrine, nutritional and metabolic diseases complicating pregnancy, second trimester: Secondary | ICD-10-CM | POA: Diagnosis not present

## 2020-05-09 DIAGNOSIS — O3429 Maternal care due to uterine scar from other previous surgery: Secondary | ICD-10-CM | POA: Diagnosis not present

## 2020-05-09 DIAGNOSIS — O99212 Obesity complicating pregnancy, second trimester: Secondary | ICD-10-CM | POA: Diagnosis not present

## 2020-05-09 DIAGNOSIS — O162 Unspecified maternal hypertension, second trimester: Secondary | ICD-10-CM | POA: Diagnosis not present

## 2020-05-09 DIAGNOSIS — O09819 Supervision of pregnancy resulting from assisted reproductive technology, unspecified trimester: Secondary | ICD-10-CM | POA: Diagnosis not present

## 2020-05-10 DIAGNOSIS — R69 Illness, unspecified: Secondary | ICD-10-CM | POA: Diagnosis not present

## 2020-05-11 ENCOUNTER — Other Ambulatory Visit: Payer: Self-pay

## 2020-05-11 ENCOUNTER — Encounter: Payer: Self-pay | Admitting: Adult Health

## 2020-05-11 ENCOUNTER — Ambulatory Visit (INDEPENDENT_AMBULATORY_CARE_PROVIDER_SITE_OTHER): Payer: No Typology Code available for payment source | Admitting: Adult Health

## 2020-05-11 VITALS — BP 130/70 | HR 95 | Temp 97.2°F | Ht 68.0 in | Wt 235.0 lb

## 2020-05-11 DIAGNOSIS — R0683 Snoring: Secondary | ICD-10-CM | POA: Diagnosis not present

## 2020-05-11 DIAGNOSIS — R400 Somnolence: Secondary | ICD-10-CM | POA: Diagnosis not present

## 2020-05-11 DIAGNOSIS — R0600 Dyspnea, unspecified: Secondary | ICD-10-CM | POA: Diagnosis not present

## 2020-05-11 DIAGNOSIS — G4733 Obstructive sleep apnea (adult) (pediatric): Secondary | ICD-10-CM | POA: Diagnosis not present

## 2020-05-11 NOTE — Assessment & Plan Note (Signed)
Dyspnea post COVID-19 questionable etiology.  Work-up has has been unrevealing.  Suspect is multifactorial with combination of pregnancy, deconditioning.  2D echo shows only mild LVH.  No evidence of volume overload. PFT showed no significant airflow obstruction.  May have a component of some mild intermittent asthma can continue on Symbicort.  Plan  Patient Instructions  Set up for split night sleep study .  Voice therapy as planned.  Activity as tolerated.  Saline nasal rinses and gel As needed .  Continue Symbicort 2 puffs Twice daily , rinse after use.  Voice rest , sips of water . No MINTS.  Avoid eating 2-3 hrs prior to bedtime.   Dream wear nasal mask .  Follow up with Dr. Elsworth Soho or Junah Yam NP in 2 months and As needed   Please contact office for sooner follow up if symptoms do not improve or worsen or seek emergency care

## 2020-05-11 NOTE — Assessment & Plan Note (Signed)
Ongoing daytime sleepiness restless sleep and snoring highly suspicious for sleep apnea.  Patient will proceed with an in lab sleep study.  Home sleep study did not have adequate data for analysis.
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
## 2020-05-11 NOTE — Patient Instructions (Addendum)
Set up for split night sleep study .  Voice therapy as planned.  Activity as tolerated.  Saline nasal rinses and gel As needed .  Continue Symbicort 2 puffs Twice daily , rinse after use.  Voice rest , sips of water . No MINTS.  Avoid eating 2-3 hrs prior to bedtime.   Dream wear nasal mask .  Follow up with Dr. Elsworth Soho or Shonica Weier NP in 2 months and As needed   Please contact office for sooner follow up if symptoms do not improve or worsen or seek emergency care

## 2020-05-12 DIAGNOSIS — R69 Illness, unspecified: Secondary | ICD-10-CM | POA: Diagnosis not present

## 2020-05-17 DIAGNOSIS — U099 Post covid-19 condition, unspecified: Secondary | ICD-10-CM | POA: Diagnosis not present

## 2020-05-17 DIAGNOSIS — J383 Other diseases of vocal cords: Secondary | ICD-10-CM | POA: Diagnosis not present

## 2020-05-17 DIAGNOSIS — R053 Chronic cough: Secondary | ICD-10-CM | POA: Diagnosis not present

## 2020-05-17 DIAGNOSIS — R490 Dysphonia: Secondary | ICD-10-CM | POA: Diagnosis not present

## 2020-05-18 ENCOUNTER — Telehealth: Payer: Self-pay | Admitting: Adult Health

## 2020-05-18 DIAGNOSIS — O99810 Abnormal glucose complicating pregnancy: Secondary | ICD-10-CM | POA: Diagnosis not present

## 2020-05-18 NOTE — Telephone Encounter (Signed)
Called and spoke with patient. I checked with her to see if she was familiar with the information above. She stated that she was familiar with April Dyer and she is ok with [place] faxing a copy of her last 2 OV notes.   Will fax copy of notes.   Nothing further needed at time of call.

## 2020-05-19 DIAGNOSIS — R69 Illness, unspecified: Secondary | ICD-10-CM | POA: Diagnosis not present

## 2020-05-23 DIAGNOSIS — U099 Post covid-19 condition, unspecified: Secondary | ICD-10-CM | POA: Diagnosis not present

## 2020-05-23 DIAGNOSIS — R053 Chronic cough: Secondary | ICD-10-CM | POA: Diagnosis not present

## 2020-05-23 DIAGNOSIS — J383 Other diseases of vocal cords: Secondary | ICD-10-CM | POA: Diagnosis not present

## 2020-05-23 DIAGNOSIS — R490 Dysphonia: Secondary | ICD-10-CM | POA: Diagnosis not present

## 2020-05-23 DIAGNOSIS — R69 Illness, unspecified: Secondary | ICD-10-CM | POA: Diagnosis not present

## 2020-05-30 DIAGNOSIS — R69 Illness, unspecified: Secondary | ICD-10-CM | POA: Diagnosis not present

## 2020-06-05 DIAGNOSIS — R69 Illness, unspecified: Secondary | ICD-10-CM | POA: Diagnosis not present

## 2020-06-06 DIAGNOSIS — R490 Dysphonia: Secondary | ICD-10-CM | POA: Diagnosis not present

## 2020-06-06 DIAGNOSIS — R053 Chronic cough: Secondary | ICD-10-CM | POA: Diagnosis not present

## 2020-06-06 DIAGNOSIS — U099 Post covid-19 condition, unspecified: Secondary | ICD-10-CM | POA: Diagnosis not present

## 2020-06-06 DIAGNOSIS — J383 Other diseases of vocal cords: Secondary | ICD-10-CM | POA: Diagnosis not present

## 2020-06-07 DIAGNOSIS — R69 Illness, unspecified: Secondary | ICD-10-CM | POA: Diagnosis not present

## 2020-06-07 DIAGNOSIS — O162 Unspecified maternal hypertension, second trimester: Secondary | ICD-10-CM | POA: Diagnosis not present

## 2020-06-07 DIAGNOSIS — O3429 Maternal care due to uterine scar from other previous surgery: Secondary | ICD-10-CM | POA: Diagnosis not present

## 2020-06-07 DIAGNOSIS — O24419 Gestational diabetes mellitus in pregnancy, unspecified control: Secondary | ICD-10-CM | POA: Diagnosis not present

## 2020-06-07 DIAGNOSIS — O09529 Supervision of elderly multigravida, unspecified trimester: Secondary | ICD-10-CM | POA: Diagnosis not present

## 2020-06-07 DIAGNOSIS — O09819 Supervision of pregnancy resulting from assisted reproductive technology, unspecified trimester: Secondary | ICD-10-CM | POA: Diagnosis not present

## 2020-06-07 DIAGNOSIS — O99282 Endocrine, nutritional and metabolic diseases complicating pregnancy, second trimester: Secondary | ICD-10-CM | POA: Diagnosis not present

## 2020-06-09 DIAGNOSIS — R69 Illness, unspecified: Secondary | ICD-10-CM | POA: Diagnosis not present

## 2020-06-12 ENCOUNTER — Other Ambulatory Visit: Payer: Self-pay | Admitting: Obstetrics and Gynecology

## 2020-06-12 ENCOUNTER — Telehealth: Payer: Self-pay | Admitting: Pulmonary Disease

## 2020-06-12 DIAGNOSIS — R69 Illness, unspecified: Secondary | ICD-10-CM | POA: Diagnosis not present

## 2020-06-12 DIAGNOSIS — O09522 Supervision of elderly multigravida, second trimester: Secondary | ICD-10-CM

## 2020-06-12 DIAGNOSIS — Z363 Encounter for antenatal screening for malformations: Secondary | ICD-10-CM

## 2020-06-12 DIAGNOSIS — Z3A25 25 weeks gestation of pregnancy: Secondary | ICD-10-CM

## 2020-06-12 DIAGNOSIS — O10913 Unspecified pre-existing hypertension complicating pregnancy, third trimester: Secondary | ICD-10-CM

## 2020-06-12 NOTE — Telephone Encounter (Addendum)
Pt is scheduled for sleep study on 4/5.  I called & she states she is having a lot of health issues and OB does not want her to wait until 4/5 and she called [facility] & they said they could get her in this week.  Pt states she would prefer NOT to go to [facility].  I told her our docs can't read study there.  [facility] has documented no PA required.  I told pt I would call our lab & see if anything open prior to 4/5.  She states she is willing to go to [facility].  I told her would check with our lab & will call her back.  I called Sleep Lab to see if anything available sooner and had to leave vm.

## 2020-06-13 ENCOUNTER — Ambulatory Visit: Payer: No Typology Code available for payment source | Admitting: *Deleted

## 2020-06-13 ENCOUNTER — Other Ambulatory Visit: Payer: Self-pay | Admitting: *Deleted

## 2020-06-13 ENCOUNTER — Other Ambulatory Visit: Payer: Self-pay

## 2020-06-13 ENCOUNTER — Ambulatory Visit: Payer: No Typology Code available for payment source | Attending: Obstetrics and Gynecology

## 2020-06-13 ENCOUNTER — Encounter: Payer: Self-pay | Admitting: *Deleted

## 2020-06-13 VITALS — BP 130/78 | HR 86

## 2020-06-13 DIAGNOSIS — O99282 Endocrine, nutritional and metabolic diseases complicating pregnancy, second trimester: Secondary | ICD-10-CM | POA: Insufficient documentation

## 2020-06-13 DIAGNOSIS — O09522 Supervision of elderly multigravida, second trimester: Secondary | ICD-10-CM

## 2020-06-13 DIAGNOSIS — O99212 Obesity complicating pregnancy, second trimester: Secondary | ICD-10-CM | POA: Insufficient documentation

## 2020-06-13 DIAGNOSIS — Z3A25 25 weeks gestation of pregnancy: Secondary | ICD-10-CM | POA: Insufficient documentation

## 2020-06-13 DIAGNOSIS — H52223 Regular astigmatism, bilateral: Secondary | ICD-10-CM | POA: Diagnosis not present

## 2020-06-13 DIAGNOSIS — E669 Obesity, unspecified: Secondary | ICD-10-CM | POA: Diagnosis not present

## 2020-06-13 DIAGNOSIS — D259 Leiomyoma of uterus, unspecified: Secondary | ICD-10-CM | POA: Diagnosis not present

## 2020-06-13 DIAGNOSIS — O24410 Gestational diabetes mellitus in pregnancy, diet controlled: Secondary | ICD-10-CM | POA: Diagnosis not present

## 2020-06-13 DIAGNOSIS — I10 Essential (primary) hypertension: Secondary | ICD-10-CM

## 2020-06-13 DIAGNOSIS — O3410 Maternal care for benign tumor of corpus uteri, unspecified trimester: Secondary | ICD-10-CM | POA: Insufficient documentation

## 2020-06-13 DIAGNOSIS — O10913 Unspecified pre-existing hypertension complicating pregnancy, third trimester: Secondary | ICD-10-CM

## 2020-06-13 DIAGNOSIS — O10919 Unspecified pre-existing hypertension complicating pregnancy, unspecified trimester: Secondary | ICD-10-CM

## 2020-06-13 DIAGNOSIS — E039 Hypothyroidism, unspecified: Secondary | ICD-10-CM | POA: Diagnosis not present

## 2020-06-13 DIAGNOSIS — O09812 Supervision of pregnancy resulting from assisted reproductive technology, second trimester: Secondary | ICD-10-CM | POA: Insufficient documentation

## 2020-06-13 DIAGNOSIS — O3429 Maternal care due to uterine scar from other previous surgery: Secondary | ICD-10-CM | POA: Insufficient documentation

## 2020-06-13 DIAGNOSIS — Z363 Encounter for antenatal screening for malformations: Secondary | ICD-10-CM | POA: Diagnosis not present

## 2020-06-13 DIAGNOSIS — O10912 Unspecified pre-existing hypertension complicating pregnancy, second trimester: Secondary | ICD-10-CM | POA: Insufficient documentation

## 2020-06-13 NOTE — Telephone Encounter (Signed)
Pt has been scheduled for 3/24 at [facility].  Spoke to her and gave her appt info.  Nothing further needed.

## 2020-06-14 ENCOUNTER — Encounter: Payer: Self-pay | Admitting: Registered"

## 2020-06-14 ENCOUNTER — Encounter: Payer: No Typology Code available for payment source | Attending: Obstetrics and Gynecology | Admitting: Registered"

## 2020-06-14 DIAGNOSIS — O24419 Gestational diabetes mellitus in pregnancy, unspecified control: Secondary | ICD-10-CM | POA: Insufficient documentation

## 2020-06-14 NOTE — Progress Notes (Signed)
Patient was seen on 06/14/20 for Gestational Diabetes self-management class at [facility]. The following learning objectives were met by the patient during this course:   States the definition of Gestational Diabetes  States why dietary management is important in controlling blood glucose  Describes the effects each nutrient has on blood glucose levels  Demonstrates ability to create a balanced meal plan  Demonstrates carbohydrate counting   States when to check blood glucose levels  Demonstrates proper blood glucose monitoring techniques  States the effect of stress and exercise on blood glucose levels  States the importance of limiting caffeine and abstaining from alcohol and smoking  Blood glucose monitor given: none. Patient has Accu-chek but does not have a prescription to get more supplies  Patient instructed to monitor glucose levels: FBS: 60 - <95; 1 hour: <140; 2 hour: <120  Patient received handouts:  Nutrition Diabetes and Pregnancy, including carb counting list  Patient will be seen for follow-up as needed.

## 2020-06-15 ENCOUNTER — Ambulatory Visit (HOSPITAL_BASED_OUTPATIENT_CLINIC_OR_DEPARTMENT_OTHER): Payer: No Typology Code available for payment source | Attending: Adult Health | Admitting: Pulmonary Disease

## 2020-06-15 ENCOUNTER — Other Ambulatory Visit: Payer: Self-pay

## 2020-06-15 DIAGNOSIS — R0683 Snoring: Secondary | ICD-10-CM | POA: Diagnosis present

## 2020-06-15 DIAGNOSIS — G4761 Periodic limb movement disorder: Secondary | ICD-10-CM | POA: Insufficient documentation

## 2020-06-15 DIAGNOSIS — G4733 Obstructive sleep apnea (adult) (pediatric): Secondary | ICD-10-CM | POA: Insufficient documentation

## 2020-06-15 DIAGNOSIS — R400 Somnolence: Secondary | ICD-10-CM

## 2020-06-16 DIAGNOSIS — R69 Illness, unspecified: Secondary | ICD-10-CM | POA: Diagnosis not present

## 2020-06-19 DIAGNOSIS — R69 Illness, unspecified: Secondary | ICD-10-CM | POA: Diagnosis not present

## 2020-06-20 ENCOUNTER — Telehealth: Payer: Self-pay | Admitting: Pulmonary Disease

## 2020-06-20 DIAGNOSIS — R0683 Snoring: Secondary | ICD-10-CM | POA: Diagnosis not present

## 2020-06-20 DIAGNOSIS — R400 Somnolence: Secondary | ICD-10-CM

## 2020-06-20 NOTE — Telephone Encounter (Signed)
No sig OSA noted on NPSG Some leg movements which may be expected during pregnancy

## 2020-06-20 NOTE — Procedures (Signed)
Patient Name: April Dyer, April Dyer Date: 06/15/2020 Gender: Female D.O.B: [birth year] Age (years): 71 Referring Provider: Lynelle Smoke Parrett Height (inches): 68 Interpreting Physician: Kara Mead MD, ABSM Weight (lbs): 234 RPSGT: Jorge Ny BMI: 36 MRN: [id number] Neck Size: 16.50 <br> <br> CLINICAL INFORMATION Sleep Study Type: NPSG    Indication for sleep study: Congestive Heart Failure, Diabetes, Fatigue, Hypertension, Morning Headaches, Obesity, OSA, Snoring    Epworth Sleepiness Score: 0    SLEEP STUDY TECHNIQUE As per the AASM Manual for the Scoring of Sleep and Associated Events v2.3 (April 2016) with a hypopnea requiring 4% desaturations.  The channels recorded and monitored were frontal, central and occipital EEG, electrooculogram (EOG), submentalis EMG (chin), nasal and oral airflow, thoracic and abdominal wall motion, anterior tibialis EMG, snore microphone, electrocardiogram, and pulse oximetry.  MEDICATIONS Medications self-administered by patient taken the night of the study : N/A  SLEEP ARCHITECTURE The study was initiated at 10:51:44 PM and ended at 5:04:36 AM.  Sleep onset time was 32.3 minutes and the sleep efficiency was 63.7%%. The total sleep time was 237.5 minutes.  Stage REM latency was 59.5 minutes.  The patient spent 10.1%% of the night in stage N1 sleep, 76.0%% in stage N2 sleep, 0.0%% in stage N3 and 13.9% in REM.  Alpha intrusion was absent.  Supine sleep was 0.00%.  RESPIRATORY PARAMETERS The overall apnea/hypopnea index (AHI) was 1.0 per hour. There were 0 total apneas, including 0 obstructive, 0 central and 0 mixed apneas. There were 4 hypopneas and 1 RERAs.  The AHI during Stage REM sleep was 3.6 per hour.  AHI while supine was N/A per hour.  The mean oxygen saturation was 94.5%. The minimum SpO2 during sleep was 90.0%.  moderate snoring was noted during this study.  CARDIAC DATA The 2 lead EKG demonstrated sinus  rhythm. The mean heart rate was 80.5 beats per minute. Other EKG findings include: PVCs. LEG MOVEMENT DATA The total PLMS were 0 with a resulting PLMS index of 0.0. Associated arousal with leg movement index was 5.8 .  IMPRESSIONS - No significant obstructive sleep apnea occurred during this study (AHI = 1.0/h). - The patient had minimal or no oxygen desaturation during the study (Min O2 = 90.0%) - The patient snored with moderate snoring volume. - EKG findings include PVCs. - Several limb movements were noted. Clinically significant periodic limb movements did not occur during sleep. Associated arousals were mildly significant 5.8/h   DIAGNOSIS - Periodic Limb Movement During Sleep (G47.61)   RECOMMENDATIONS - Reassess Periodic Leg Movements of Sleep after pregnancy. Evaluate for iron deficiency - Avoid alcohol, sedatives and other CNS depressants that may worsen sleep apnea and disrupt normal sleep architecture. - Sleep hygiene should be reviewed to assess factors that may improve sleep quality. - Weight management and regular exercise should be initiated or continued if appropriate.   Kara Mead MD Board Certified in [place]

## 2020-06-21 DIAGNOSIS — O24419 Gestational diabetes mellitus in pregnancy, unspecified control: Secondary | ICD-10-CM | POA: Diagnosis not present

## 2020-06-21 DIAGNOSIS — O99212 Obesity complicating pregnancy, second trimester: Secondary | ICD-10-CM | POA: Diagnosis not present

## 2020-06-21 DIAGNOSIS — O3429 Maternal care due to uterine scar from other previous surgery: Secondary | ICD-10-CM | POA: Diagnosis not present

## 2020-06-21 DIAGNOSIS — O162 Unspecified maternal hypertension, second trimester: Secondary | ICD-10-CM | POA: Diagnosis not present

## 2020-06-21 DIAGNOSIS — O09819 Supervision of pregnancy resulting from assisted reproductive technology, unspecified trimester: Secondary | ICD-10-CM | POA: Diagnosis not present

## 2020-06-21 DIAGNOSIS — O09529 Supervision of elderly multigravida, unspecified trimester: Secondary | ICD-10-CM | POA: Diagnosis not present

## 2020-06-21 DIAGNOSIS — O99282 Endocrine, nutritional and metabolic diseases complicating pregnancy, second trimester: Secondary | ICD-10-CM | POA: Diagnosis not present

## 2020-06-21 NOTE — Telephone Encounter (Signed)
Called and went over NPSG results per Dr Elsworth Soho with patient. All questions answered and patient expressed full understanding. Confirmed upcoming scheduled office visit on 07/17/2020 at [facility]. Nothing further needed at this time.

## 2020-06-27 ENCOUNTER — Encounter (HOSPITAL_BASED_OUTPATIENT_CLINIC_OR_DEPARTMENT_OTHER): Payer: No Typology Code available for payment source | Admitting: Pulmonary Disease

## 2020-06-28 ENCOUNTER — Encounter (HOSPITAL_COMMUNITY): Payer: Self-pay | Admitting: Obstetrics and Gynecology

## 2020-06-28 ENCOUNTER — Observation Stay (HOSPITAL_COMMUNITY)
Admission: AD | Admit: 2020-06-28 | Discharge: 2020-06-29 | Disposition: A | Discharge status: Home / Self Care | Payer: No Typology Code available for payment source | Attending: Obstetrics and Gynecology | Admitting: Obstetrics and Gynecology

## 2020-06-28 ENCOUNTER — Other Ambulatory Visit: Payer: Self-pay

## 2020-06-28 DIAGNOSIS — E039 Hypothyroidism, unspecified: Secondary | ICD-10-CM

## 2020-06-28 DIAGNOSIS — O99283 Endocrine, nutritional and metabolic diseases complicating pregnancy, third trimester: Secondary | ICD-10-CM | POA: Diagnosis not present

## 2020-06-28 DIAGNOSIS — J45909 Unspecified asthma, uncomplicated: Secondary | ICD-10-CM | POA: Diagnosis not present

## 2020-06-28 DIAGNOSIS — Z20822 Contact with and (suspected) exposure to covid-19: Secondary | ICD-10-CM | POA: Diagnosis not present

## 2020-06-28 DIAGNOSIS — O09813 Supervision of pregnancy resulting from assisted reproductive technology, third trimester: Secondary | ICD-10-CM | POA: Insufficient documentation

## 2020-06-28 DIAGNOSIS — O10013 Pre-existing essential hypertension complicating pregnancy, third trimester: Principal | ICD-10-CM | POA: Insufficient documentation

## 2020-06-28 DIAGNOSIS — Z3A27 27 weeks gestation of pregnancy: Secondary | ICD-10-CM | POA: Insufficient documentation

## 2020-06-28 DIAGNOSIS — O99282 Endocrine, nutritional and metabolic diseases complicating pregnancy, second trimester: Secondary | ICD-10-CM

## 2020-06-28 DIAGNOSIS — O99513 Diseases of the respiratory system complicating pregnancy, third trimester: Secondary | ICD-10-CM | POA: Diagnosis not present

## 2020-06-28 DIAGNOSIS — O24410 Gestational diabetes mellitus in pregnancy, diet controlled: Secondary | ICD-10-CM

## 2020-06-28 DIAGNOSIS — O10912 Unspecified pre-existing hypertension complicating pregnancy, second trimester: Secondary | ICD-10-CM

## 2020-06-28 DIAGNOSIS — O24419 Gestational diabetes mellitus in pregnancy, unspecified control: Secondary | ICD-10-CM | POA: Insufficient documentation

## 2020-06-28 DIAGNOSIS — O10913 Unspecified pre-existing hypertension complicating pregnancy, third trimester: Secondary | ICD-10-CM | POA: Diagnosis present

## 2020-06-28 LAB — COMPREHENSIVE METABOLIC PANEL
ALT: 11 U/L (ref 0–44)
AST: 20 U/L (ref 15–41)
Albumin: 3 g/dL — ABNORMAL LOW (ref 3.5–5.0)
Alkaline Phosphatase: 77 U/L (ref 38–126)
Anion gap: 7 (ref 5–15)
BUN: 6 mg/dL (ref 6–20)
CO2: 21 mmol/L — ABNORMAL LOW (ref 22–32)
Calcium: 8.7 mg/dL — ABNORMAL LOW (ref 8.9–10.3)
Chloride: 110 mmol/L (ref 98–111)
Creatinine, Ser: 0.64 mg/dL (ref 0.44–1.00)
GFR, Estimated: >60 mL/min (ref >60)
Glucose, Bld: 75 mg/dL (ref 70–99)
Potassium: 3.7 mmol/L (ref 3.5–5.1)
Sodium: 138 mmol/L (ref 135–145)
Total Bilirubin: 0.5 mg/dL (ref 0.3–1.2)
Total Protein: 5.9 g/dL — ABNORMAL LOW (ref 6.5–8.1)

## 2020-06-28 LAB — RESP PANEL BY RT-PCR (FLU A&B, COVID) ARPGX2
Influenza A by PCR: NEGATIVE
Influenza B by PCR: NEGATIVE
SARS Coronavirus 2 by RT PCR: NEGATIVE

## 2020-06-28 LAB — CBC
HCT: 35.3 % — ABNORMAL LOW (ref 36.0–46.0)
Hemoglobin: 12 g/dL (ref 12.0–15.0)
MCH: 33.7 pg (ref 26.0–34.0)
MCHC: 34 g/dL (ref 30.0–36.0)
MCV: 99.2 fL (ref 80.0–100.0)
Platelets: 238 10*3/uL (ref 150–400)
RBC: 3.56 MIL/uL — ABNORMAL LOW (ref 3.87–5.11)
RDW: 13.9 % (ref 11.5–15.5)
WBC: 11.7 10*3/uL — ABNORMAL HIGH (ref 4.0–10.5)
nRBC: 0 % (ref 0.0–0.2)

## 2020-06-28 LAB — TYPE AND SCREEN
ABO/RH(D): O POS
Antibody Screen: NEGATIVE

## 2020-06-28 LAB — PROTEIN / CREATININE RATIO, URINE
Creatinine, Urine: 275.62 mg/dL
Protein Creatinine Ratio: 0.16 mg/mg{Cre} — ABNORMAL HIGH (ref 0.00–0.15)
Total Protein, Urine: 43 mg/dL

## 2020-06-28 LAB — GLUCOSE, CAPILLARY
Glucose-Capillary: 76 mg/dL (ref 70–99)
Glucose-Capillary: 79 mg/dL (ref 70–99)

## 2020-06-28 LAB — URIC ACID: Uric Acid, Serum: 4 mg/dL (ref 2.5–7.1)

## 2020-06-28 MED ORDER — HYDRALAZINE HCL 20 MG/ML IJ SOLN: 10.0000 mg | INTRAMUSCULAR | Status: DC | PRN

## 2020-06-28 MED ORDER — LABETALOL HCL 200 MG PO TABS
400.0000 mg | ORAL_TABLET | Freq: Two times a day (BID) | ORAL | Status: DC
  Administered 2020-06-28 – 2020-06-29 (×2): 400 mg via ORAL
  Filled 2020-06-28 (×2): qty 2

## 2020-06-28 MED ORDER — SODIUM CHLORIDE 0.9% FLUSH: 3.0000 mL | INTRAVENOUS | Status: DC | PRN

## 2020-06-28 MED ORDER — CALCIUM CARBONATE ANTACID 500 MG PO CHEW: 2.0000 | CHEWABLE_TABLET | ORAL | Status: DC | PRN

## 2020-06-28 MED ORDER — LABETALOL HCL 5 MG/ML IV SOLN: 20.0000 mg | INTRAVENOUS | Status: DC | PRN

## 2020-06-28 MED ORDER — LEVOTHYROXINE SODIUM 150 MCG PO TABS
150.0000 ug | ORAL_TABLET | Freq: Every day | ORAL | Status: DC
  Administered 2020-06-29: 150 ug via ORAL
  Filled 2020-06-28 (×2): qty 1

## 2020-06-28 MED ORDER — ZOLPIDEM TARTRATE 5 MG PO TABS: 5.0000 mg | ORAL_TABLET | Freq: Every evening | ORAL | Status: DC | PRN

## 2020-06-28 MED ORDER — ACETAMINOPHEN 325 MG PO TABS
650.0000 mg | ORAL_TABLET | ORAL | Status: DC | PRN
  Administered 2020-06-28 – 2020-06-29 (×3): 650 mg via ORAL
  Filled 2020-06-28 (×3): qty 2

## 2020-06-28 MED ORDER — DOCUSATE SODIUM 100 MG PO CAPS
100.0000 mg | ORAL_CAPSULE | Freq: Every day | ORAL | Status: DC
  Administered 2020-06-29: 100 mg via ORAL
  Filled 2020-06-28: qty 1

## 2020-06-28 MED ORDER — SODIUM CHLORIDE 0.9 % IV SOLN: 250.0000 mL | INTRAVENOUS | Status: DC | PRN

## 2020-06-28 MED ORDER — PRENATAL MULTIVITAMIN CH
1.0000 | ORAL_TABLET | Freq: Every day | ORAL | Status: DC
  Administered 2020-06-29: 1 via ORAL
  Filled 2020-06-28: qty 1

## 2020-06-28 MED ORDER — LABETALOL HCL 5 MG/ML IV SOLN: 40.0000 mg | INTRAVENOUS | Status: DC | PRN

## 2020-06-28 MED ORDER — LABETALOL HCL 5 MG/ML IV SOLN: 80.0000 mg | INTRAVENOUS | Status: DC | PRN

## 2020-06-28 MED ORDER — LABETALOL HCL 5 MG/ML IV SOLN
20.0000 mg | Freq: Once | INTRAVENOUS | Status: AC
  Administered 2020-06-28: 20 mg via INTRAVENOUS
  Filled 2020-06-28: qty 4

## 2020-06-28 MED ORDER — SODIUM CHLORIDE 0.9% FLUSH
3.0000 mL | Freq: Two times a day (BID) | INTRAVENOUS | Status: DC
  Administered 2020-06-28 – 2020-06-29 (×2): 3 mL via INTRAVENOUS
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
## 2020-06-28 NOTE — MAU Note (Signed)
Sent from MD office for BP evaluation. Currently reports H/A and visual disturbances (seeing spots), but denies epigastric pain.  Denies VB or LOF.  Endorses +FM.
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
## 2020-06-29 DIAGNOSIS — R69 Illness, unspecified: Secondary | ICD-10-CM | POA: Diagnosis not present

## 2020-06-29 LAB — GLUCOSE, CAPILLARY
Glucose-Capillary: 116 mg/dL — ABNORMAL HIGH (ref 70–99)
Glucose-Capillary: 134 mg/dL — ABNORMAL HIGH (ref 70–99)
Glucose-Capillary: 80 mg/dL (ref 70–99)

## 2020-06-29 MED ORDER — ONDANSETRON HCL 4 MG PO TABS
4.0000 mg | ORAL_TABLET | Freq: Three times a day (TID) | ORAL | Status: DC | PRN
  Administered 2020-06-29 (×2): 4 mg via ORAL
  Filled 2020-06-29 (×2): qty 1

## 2020-06-29 NOTE — Plan of Care (Signed)
  Problem: Education: Goal: Knowledge of disease or condition will improve Outcome: Adequate for Discharge Goal: Knowledge of the prescribed therapeutic regimen will improve Outcome: Adequate for Discharge Goal: Individualized Educational Video(s) Outcome: Adequate for Discharge   Problem: Clinical Measurements: Goal: Complications related to the disease process, condition or treatment will be avoided or minimized Outcome: Adequate for Discharge   Problem: Education: Goal: Knowledge of disease or condition will improve Outcome: Adequate for Discharge Goal: Knowledge of the prescribed therapeutic regimen will improve Outcome: Adequate for Discharge   Problem: Fluid Volume: Goal: Peripheral tissue perfusion will improve Outcome: Adequate for Discharge   Problem: Clinical Measurements: Goal: Complications related to disease process, condition or treatment will be avoided or minimized Outcome: Adequate for Discharge   Problem: Education: Goal: Knowledge of General Education information will improve Description: Including pain rating scale, medication(s)/side effects and non-pharmacologic comfort measures Outcome: Adequate for Discharge   Problem: Health Behavior/Discharge Planning: Goal: Ability to manage health-related needs will improve Outcome: Adequate for Discharge   Problem: Clinical Measurements: Goal: Ability to maintain clinical measurements within normal limits will improve Outcome: Adequate for Discharge Goal: Will remain free from infection Outcome: Adequate for Discharge Goal: Diagnostic test results will improve Outcome: Adequate for Discharge Goal: Respiratory complications will improve Outcome: Adequate for Discharge Goal: Cardiovascular complication will be avoided Outcome: Adequate for Discharge   Problem: Activity: Goal: Risk for activity intolerance will decrease Outcome: Adequate for Discharge   Problem: Nutrition: Goal: Adequate nutrition will be  maintained Outcome: Adequate for Discharge   Problem: Coping: Goal: Level of anxiety will decrease Outcome: Adequate for Discharge   Problem: Elimination: Goal: Will not experience complications related to bowel motility Outcome: Adequate for Discharge Goal: Will not experience complications related to urinary retention Outcome: Adequate for Discharge   Problem: Pain Managment: Goal: General experience of comfort will improve Outcome: Adequate for Discharge   Problem: Safety: Goal: Ability to remain free from injury will improve Outcome: Adequate for Discharge   Problem: Skin Integrity: Goal: Risk for impaired skin integrity will decrease Outcome: Adequate for Discharge

## 2020-06-29 NOTE — Progress Notes (Signed)
Pt d/c instructions given, all questions answered, pt verbalized understanding. Pt is alert and oriented x4. Pt is ambulatory and states pain is tolerable. Note given for work.

## 2020-06-29 NOTE — Discharge Summary (Signed)
Physician Discharge Summary  Patient ID: April Dyer MRN: [id number] DOB/AGE: 1970-10-25 50 y.o.  Admit date: 06/28/2020 Discharge date: 06/29/2020  Admission Diagnoses: chronic HTN exacerbation in 2nd trimester, AMA, IVF pregnancy, IUP @ 27 6/7 wk, Class A1 GDM  Discharge Diagnoses: same Active Problems:   Chronic hypertension with exacerbation during pregnancy in second trimester Chronic H/a  Discharged Condition: stable  Hospital Course:  pt was placed in observation after severe range BP was noted in MAU for which she was given IV labetalol. Her oral labetalol dose was increased And the patient was monitored overnight with improvement in her BP on new regimen. Pt has significant home stresses that are contributing to her BP issue Which was discussed again.   Consults: None  Significant Diagnostic Studies: labs:  CBC Latest Ref Rng & Units 06/28/2020 01/29/2020 10/07/2019  WBC 4.0 - 10.5 K/uL 11.7(H) 11.2(H) 7.8  Hemoglobin 12.0 - 15.0 g/dL 12.0 12.6 12.8  Hematocrit 36.0 - 46.0 % 35.3(L) 37.6 38.2  Platelets 150 - 400 K/uL 238 240 246    CMP Latest Ref Rng & Units 06/28/2020 10/07/2019 05/05/2018  Glucose 70 - 99 mg/dL 75 86 81  BUN 6 - 20 mg/dL 6 19 10   Creatinine 0.44 - 1.00 mg/dL 0.64 0.99 0.76  Sodium 135 - 145 mmol/L 138 142 138  Potassium 3.5 - 5.1 mmol/L 3.7 3.9 3.3(L)  Chloride 98 - 111 mmol/L 110 110 107  CO2 22 - 32 mmol/L 21(L) 23 24  Calcium 8.9 - 10.3 mg/dL 8.7(L) 9.1 8.8(L)  Total Protein 6.5 - 8.1 g/dL 5.9(L) - 7.3  Total Bilirubin 0.3 - 1.2 mg/dL 0.5 - 0.5  Alkaline Phos 38 - 126 U/L 77 - 79  AST 15 - 41 U/L 20 - 20  ALT 0 - 44 U/L 11 - 20     Treatments: increase labetalol dose  Discharge Exam: Blood pressure 120/67, pulse 82, temperature 98.6 F (37 C), temperature source Oral, resp. rate 18, height 5\' 8"  (1.727 m), weight 104 kg, SpO2 100 %. General appearance: alert, cooperative, and no distress Resp: clear to auscultation  bilaterally Cardio: regular rate and rhythm, S1, S2 normal, no murmur, click, rub or gallop GI: gravid soft obese Pelvic: deferred Extremities: extremities normal, atraumatic, no cyanosis  trace edema Skin: Skin color, texture, turgor normal. No rashes or lesions or normal  Disposition: Discharge disposition: 01-Home or Self Care       Discharge Instructions     Diet - low sodium heart healthy   Complete by: As directed    Discharge instructions   Complete by: As directed    Preeclampsia warning signs, daily kick ct,   Increase activity slowly   Complete by: As directed       Allergies as of 06/29/2020   No Known Allergies      Medication List     TAKE these medications    acetaminophen 500 MG tablet Commonly known as: TYLENOL Take 1,000 mg by mouth every 6 (six) hours as needed for headache.   aspirin 81 MG chewable tablet Chew 81 mg by mouth daily.   budesonide-formoterol 160-4.5 MCG/ACT inhaler Commonly known as: Symbicort Inhale 2 puffs into the lungs 2 (two) times daily.   chlorhexidine 0.12 % solution Commonly known as: PERIDEX Use as directed 15 mLs in the mouth or throat in the morning, at noon, and at bedtime.   labetalol 200 MG tablet Commonly known as: NORMODYNE Take 400 mg by mouth 2 (two) times daily.  levothyroxine 175 MCG tablet Commonly known as: SYNTHROID Take 175 mcg by mouth daily before breakfast.   ondansetron 4 MG tablet Commonly known as: ZOFRAN Take 4 mg by mouth every 8 (eight) hours as needed for nausea or vomiting.   prenatal multivitamin Tabs tablet Take 1 tablet by mouth daily at 12 noon.   ProAir HFA 108 (90 Base) MCG/ACT inhaler Generic drug: albuterol Inhale 1-2 puffs into the lungs every 4 (four) hours as needed for wheezing or shortness of breath.   VITAMIN B-12 PO Take 1 tablet by mouth daily.   Vitamin D (Ergocalciferol) 1.25 MG (50000 UNIT) Caps capsule Commonly known as: DRISDOL Take 50,000 Units by mouth  once a week. Thursday        Follow-up Information     Servando Salina, MD Follow up in 1 week(s).   Specialty: Obstetrics and Gynecology Contact information: [address] [place] [address] [phone]               Letter to decrease work hours to  6 hr/d given  Signed: Marvene Staff 06/29/2020, 7:09 PM
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
## 2020-07-03 DIAGNOSIS — R69 Illness, unspecified: Secondary | ICD-10-CM | POA: Diagnosis not present

## 2020-07-05 DIAGNOSIS — O99283 Endocrine, nutritional and metabolic diseases complicating pregnancy, third trimester: Secondary | ICD-10-CM | POA: Diagnosis not present

## 2020-07-07 DIAGNOSIS — R69 Illness, unspecified: Secondary | ICD-10-CM | POA: Diagnosis not present

## 2020-07-10 DIAGNOSIS — R69 Illness, unspecified: Secondary | ICD-10-CM | POA: Diagnosis not present

## 2020-07-13 ENCOUNTER — Other Ambulatory Visit: Payer: Self-pay | Admitting: *Deleted

## 2020-07-13 ENCOUNTER — Other Ambulatory Visit: Payer: Self-pay

## 2020-07-13 ENCOUNTER — Encounter: Payer: Self-pay | Admitting: *Deleted

## 2020-07-13 ENCOUNTER — Ambulatory Visit: Payer: No Typology Code available for payment source | Admitting: Adult Health

## 2020-07-13 ENCOUNTER — Ambulatory Visit: Payer: No Typology Code available for payment source | Attending: Obstetrics

## 2020-07-13 ENCOUNTER — Ambulatory Visit: Payer: No Typology Code available for payment source | Admitting: *Deleted

## 2020-07-13 VITALS — BP 138/78 | HR 91

## 2020-07-13 DIAGNOSIS — I10 Essential (primary) hypertension: Secondary | ICD-10-CM | POA: Diagnosis not present

## 2020-07-13 DIAGNOSIS — O3429 Maternal care due to uterine scar from other previous surgery: Secondary | ICD-10-CM

## 2020-07-13 DIAGNOSIS — O3413 Maternal care for benign tumor of corpus uteri, third trimester: Secondary | ICD-10-CM

## 2020-07-13 DIAGNOSIS — O10013 Pre-existing essential hypertension complicating pregnancy, third trimester: Secondary | ICD-10-CM | POA: Diagnosis not present

## 2020-07-13 DIAGNOSIS — O99283 Endocrine, nutritional and metabolic diseases complicating pregnancy, third trimester: Secondary | ICD-10-CM

## 2020-07-13 DIAGNOSIS — E039 Hypothyroidism, unspecified: Secondary | ICD-10-CM

## 2020-07-13 DIAGNOSIS — Z3A30 30 weeks gestation of pregnancy: Secondary | ICD-10-CM

## 2020-07-13 DIAGNOSIS — O99213 Obesity complicating pregnancy, third trimester: Secondary | ICD-10-CM

## 2020-07-13 DIAGNOSIS — O09523 Supervision of elderly multigravida, third trimester: Secondary | ICD-10-CM | POA: Diagnosis not present

## 2020-07-13 DIAGNOSIS — O09813 Supervision of pregnancy resulting from assisted reproductive technology, third trimester: Secondary | ICD-10-CM

## 2020-07-13 DIAGNOSIS — O10919 Unspecified pre-existing hypertension complicating pregnancy, unspecified trimester: Secondary | ICD-10-CM | POA: Diagnosis not present

## 2020-07-13 DIAGNOSIS — D259 Leiomyoma of uterus, unspecified: Secondary | ICD-10-CM | POA: Diagnosis not present

## 2020-07-13 DIAGNOSIS — E669 Obesity, unspecified: Secondary | ICD-10-CM | POA: Diagnosis not present

## 2020-07-13 DIAGNOSIS — O09529 Supervision of elderly multigravida, unspecified trimester: Secondary | ICD-10-CM

## 2020-07-14 DIAGNOSIS — R69 Illness, unspecified: Secondary | ICD-10-CM | POA: Diagnosis not present

## 2020-07-17 ENCOUNTER — Ambulatory Visit: Payer: No Typology Code available for payment source | Admitting: Adult Health

## 2020-07-17 DIAGNOSIS — R69 Illness, unspecified: Secondary | ICD-10-CM | POA: Diagnosis not present

## 2020-07-21 ENCOUNTER — Ambulatory Visit: Payer: No Typology Code available for payment source | Admitting: Adult Health

## 2020-07-21 NOTE — Progress Notes (Deleted)
Patient no-showed today's appointment; provider notified for review of record.

## 2020-07-25 ENCOUNTER — Other Ambulatory Visit: Payer: Self-pay

## 2020-07-25 ENCOUNTER — Ambulatory Visit (INDEPENDENT_AMBULATORY_CARE_PROVIDER_SITE_OTHER): Payer: No Typology Code available for payment source | Admitting: Adult Health

## 2020-07-25 ENCOUNTER — Encounter: Payer: Self-pay | Admitting: Adult Health

## 2020-07-25 DIAGNOSIS — R053 Chronic cough: Secondary | ICD-10-CM
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
## 2020-07-25 NOTE — Patient Instructions (Signed)
Voice therapy as planned.  Activity as tolerated.  Saline nasal rinses and gel As needed .  Restart Symbicort 1 puffs Twice daily , rinse after use.  Voice rest , sips of water . No MINTS.  Avoid eating 2-3 hrs prior to bedtime.   Albuterol inhaler As needed   Please obtain copy of chest xray report and send to [place].  Follow up with Dr. Elsworth Soho or Muslima Toppins NP in 6 weeks and As needed   Please contact office for sooner follow up if symptoms do not improve or worsen or seek emergency care

## 2020-07-27 ENCOUNTER — Inpatient Hospital Stay (HOSPITAL_BASED_OUTPATIENT_CLINIC_OR_DEPARTMENT_OTHER): Payer: No Typology Code available for payment source

## 2020-07-27 ENCOUNTER — Inpatient Hospital Stay (HOSPITAL_COMMUNITY)
Admission: AD | Admit: 2020-07-27 | Discharge: 2020-07-27 | Disposition: A | Discharge status: Home / Self Care | Payer: No Typology Code available for payment source | Attending: Obstetrics and Gynecology | Admitting: Obstetrics and Gynecology

## 2020-07-27 ENCOUNTER — Encounter (HOSPITAL_COMMUNITY): Payer: Self-pay | Admitting: Obstetrics and Gynecology

## 2020-07-27 ENCOUNTER — Other Ambulatory Visit: Payer: Self-pay

## 2020-07-27 DIAGNOSIS — Z3483 Encounter for supervision of other normal pregnancy, third trimester: Secondary | ICD-10-CM | POA: Diagnosis not present

## 2020-07-27 DIAGNOSIS — Z3A32 32 weeks gestation of pregnancy: Secondary | ICD-10-CM | POA: Insufficient documentation

## 2020-07-27 DIAGNOSIS — O99213 Obesity complicating pregnancy, third trimester: Secondary | ICD-10-CM | POA: Insufficient documentation

## 2020-07-27 DIAGNOSIS — O09523 Supervision of elderly multigravida, third trimester: Secondary | ICD-10-CM | POA: Insufficient documentation

## 2020-07-27 DIAGNOSIS — O99283 Endocrine, nutritional and metabolic diseases complicating pregnancy, third trimester: Secondary | ICD-10-CM | POA: Diagnosis not present

## 2020-07-27 DIAGNOSIS — J45909 Unspecified asthma, uncomplicated: Secondary | ICD-10-CM | POA: Insufficient documentation

## 2020-07-27 DIAGNOSIS — Z79890 Hormone replacement therapy (postmenopausal): Secondary | ICD-10-CM | POA: Insufficient documentation

## 2020-07-27 DIAGNOSIS — O10913 Unspecified pre-existing hypertension complicating pregnancy, third trimester: Secondary | ICD-10-CM | POA: Insufficient documentation

## 2020-07-27 DIAGNOSIS — E039 Hypothyroidism, unspecified: Secondary | ICD-10-CM | POA: Diagnosis not present

## 2020-07-27 DIAGNOSIS — O24419 Gestational diabetes mellitus in pregnancy, unspecified control: Secondary | ICD-10-CM | POA: Diagnosis not present

## 2020-07-27 DIAGNOSIS — Z7951 Long term (current) use of inhaled steroids: Secondary | ICD-10-CM | POA: Insufficient documentation

## 2020-07-27 DIAGNOSIS — O163 Unspecified maternal hypertension, third trimester: Secondary | ICD-10-CM | POA: Diagnosis not present

## 2020-07-27 DIAGNOSIS — Z79899 Other long term (current) drug therapy: Secondary | ICD-10-CM | POA: Insufficient documentation

## 2020-07-27 DIAGNOSIS — O403XX0 Polyhydramnios, third trimester, not applicable or unspecified: Secondary | ICD-10-CM | POA: Diagnosis not present

## 2020-07-27 DIAGNOSIS — O09529 Supervision of elderly multigravida, unspecified trimester: Secondary | ICD-10-CM | POA: Diagnosis not present

## 2020-07-27 DIAGNOSIS — O288 Other abnormal findings on antenatal screening of mother: Secondary | ICD-10-CM

## 2020-07-27 DIAGNOSIS — O99513 Diseases of the respiratory system complicating pregnancy, third trimester: Secondary | ICD-10-CM | POA: Diagnosis not present

## 2020-07-27 DIAGNOSIS — O09819 Supervision of pregnancy resulting from assisted reproductive technology, unspecified trimester: Secondary | ICD-10-CM | POA: Diagnosis not present

## 2020-07-27 DIAGNOSIS — O3429 Maternal care due to uterine scar from other previous surgery: Secondary | ICD-10-CM | POA: Diagnosis not present

## 2020-07-27 DIAGNOSIS — O24410 Gestational diabetes mellitus in pregnancy, diet controlled: Secondary | ICD-10-CM | POA: Insufficient documentation

## 2020-07-27 NOTE — MAU Note (Signed)
Pt sent from MD office secondary non reactive NST and needs EFM and BPP.  Denies VB or LOF.  Endorses +FM.

## 2020-07-27 NOTE — Assessment & Plan Note (Signed)
Chronic dyspnea after COVID-19 infection in August 2021 questionable etiology.  Work-up has been unrevealing thus far.  Pulmonary function testing shows normal lung function with no airflow obstruction or restriction.  2D echo with preserved EF.  And only mild LVH.  Walk test in the office with no desaturations and maintenance O2 saturations at 98% on room air. Chest x-ray results have been requested. May have a component of some mild intermittent asthma.  I have continued on Symbicort. Once delivery in June will need further work-up if dyspnea continues  Plan  Patient Instructions  Voice therapy as planned.  Activity as tolerated.  Saline nasal rinses and gel As needed .  Restart Symbicort 1 puffs Twice daily , rinse after use.  Voice rest , sips of water . No MINTS.  Avoid eating 2-3 hrs prior to bedtime.   Albuterol inhaler As needed   Please obtain copy of chest xray report and send to [place].  Follow up with Dr. Elsworth Soho or Desree Leap NP in 6 weeks and As needed   Please contact office for sooner follow up if symptoms do not improve or worsen or seek emergency care

## 2020-07-27 NOTE — Assessment & Plan Note (Signed)
Cough has improved.  Continue on current regimen.
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
## 2020-07-27 NOTE — Discharge Instructions (Signed)
Fetal Movement Counts Patient Name: ________________________________________________ Patient Due Date: ____________________  What is a fetal movement count? A fetal movement count is the number of times that you feel your baby move during a certain amount of time. This may also be called a fetal kick count. A fetal movement count is recommended for every pregnant woman. You may be asked to start counting fetal movements as early as week 28 of your pregnancy. Pay attention to when your baby is most active. You may notice your baby's sleep and wake cycles. You may also notice things that make your baby move more. You should do a fetal movement count:  When your baby is normally most active.  At the same time each day. A good time to count movements is while you are resting, after having something to eat and drink. How do I count fetal movements? 1. Find a quiet, comfortable area. Sit, or lie down on your side. 2. Write down the date, the start time and stop time, and the number of movements that you felt between those two times. Take this information with you to your health care visits. 3. Write down your start time when you feel the first movement. 4. Count kicks, flutters, swishes, rolls, and jabs. You should feel at least 10 movements. 5. You may stop counting after you have felt 10 movements, or if you have been counting for 2 hours. Write down the stop time. 6. If you do not feel 10 movements in 2 hours, contact your health care provider for further instructions. Your health care provider may want to do additional tests to assess your baby's well-being. Contact a health care provider if:  You feel fewer than 10 movements in 2 hours.  Your baby is not moving like he or she usually does. Date: ____________ Start time: ____________ Stop time: ____________ Movements: ____________ Date: ____________ Start time: ____________ Stop time: ____________ Movements: ____________ Date: ____________  Start time: ____________ Stop time: ____________ Movements: ____________ Date: ____________ Start time: ____________ Stop time: ____________ Movements: ____________ Date: ____________ Start time: ____________ Stop time: ____________ Movements: ____________ Date: ____________ Start time: ____________ Stop time: ____________ Movements: ____________ Date: ____________ Start time: ____________ Stop time: ____________ Movements: ____________ Date: ____________ Start time: ____________ Stop time: ____________ Movements: ____________ Date: ____________ Start time: ____________ Stop time: ____________ Movements: ____________ This information is not intended to replace advice given to you by your health care provider. Make sure you discuss any questions you have with your health care provider. Document Revised: 10/29/2018 Document Reviewed: 10/29/2018 Elsevier Patient Education  2021 [place]. https://www.diabeteseducator.org/docs/default-source/living-with-diabetes/conquering-the-grocery-store-v1.pdf?sfvrsn=4">  Carbohydrate Counting for Diabetes Mellitus, Adult Carbohydrate counting is a method of keeping track of how many carbohydrates you eat. Eating carbohydrates naturally increases the amount of sugar (glucose) in the blood. Counting how many carbohydrates you eat improves your blood glucose control, which helps you manage your diabetes. It is important to know how many carbohydrates you can safely have in each meal. This is different for every person. A dietitian can help you make a meal plan and calculate how many carbohydrates you should have at each meal and snack. What foods contain carbohydrates? Carbohydrates are found in the following foods:  Grains, such as breads and cereals.  Dried beans and soy products.  Starchy vegetables, such as potatoes, peas, and corn.  Fruit and fruit juices.  Milk and yogurt.  Sweets and snack foods, such as cake, cookies, candy, chips, and soft  drinks.   How do I count carbohydrates  in foods? There are two ways to count carbohydrates in food. You can read food labels or learn standard serving sizes of foods. You can use either of the methods or a combination of both. Using the Nutrition Facts label The Nutrition Facts list is included on the labels of almost all packaged foods and beverages in the [place] It includes:  The serving size.  Information about nutrients in each serving, including the grams (g) of carbohydrate per serving. To use the Nutrition Facts:  Decide how many servings you will have.  Multiply the number of servings by the number of carbohydrates per serving.  The resulting number is the total amount of carbohydrates that you will be having. Learning the standard serving sizes of foods When you eat carbohydrate foods that are not packaged or do not include Nutrition Facts on the label, you need to measure the servings in order to count the amount of carbohydrates.  Measure the foods that you will eat with a food scale or measuring cup, if needed.  Decide how many standard-size servings you will eat.  Multiply the number of servings by 15. For foods that contain carbohydrates, one serving equals 15 g of carbohydrates. ? For example, if you eat 2 cups or 10 oz (300 g) of strawberries, you will have eaten 2 servings and 30 g of carbohydrates (2 servings x 15 g = 30 g).  For foods that have more than one food mixed, such as soups and casseroles, you must count the carbohydrates in each food that is included. The following list contains standard serving sizes of common carbohydrate-rich foods. Each of these servings has about 15 g of carbohydrates:  1 slice of bread.  1 six-inch (15 cm) tortilla.  ? cup or 2 oz (53 g) cooked rice or pasta.   cup or 3 oz (85 g) cooked or canned, drained and rinsed beans or lentils.   cup or 3 oz (85 g) starchy vegetable, such as peas, corn, or squash.   cup or 4 oz (120 g)  hot cereal.   cup or 3 oz (85 g) boiled or mashed potatoes, or  or 3 oz (85 g) of a large baked potato.   cup or 4 fl oz (118 mL) fruit juice.  1 cup or 8 fl oz (237 mL) milk.  1 small or 4 oz (106 g) apple.   or 2 oz (63 g) of a medium banana.  1 cup or 5 oz (150 g) strawberries.  3 cups or 1 oz (24 g) popped popcorn. What is an example of carbohydrate counting? To calculate the number of carbohydrates in this sample meal, follow the steps shown below. Sample meal  3 oz (85 g) chicken breast.  ? cup or 4 oz (106 g) brown rice.   cup or 3 oz (85 g) corn.  1 cup or 8 fl oz (237 mL) milk.  1 cup or 5 oz (150 g) strawberries with sugar-free whipped topping. Carbohydrate calculation 1. Identify the foods that contain carbohydrates: ? Rice. ? Corn. ? Milk. ? Strawberries. 2. Calculate how many servings you have of each food: ? 2 servings rice. ? 1 serving corn. ? 1 serving milk. ? 1 serving strawberries. 3. Multiply each number of servings by 15 g: ? 2 servings rice x 15 g = 30 g. ? 1 serving corn x 15 g = 15 g. ? 1 serving milk x 15 g = 15 g. ? 1 serving strawberries x 15 g =  15 g. 4. Add together all of the amounts to find the total grams of carbohydrates eaten: ? 30 g + 15 g + 15 g + 15 g = 75 g of carbohydrates total. What are tips for following this plan? Shopping  Develop a meal plan and then make a shopping list.  Buy fresh and frozen vegetables, fresh and frozen fruit, dairy, eggs, beans, lentils, and whole grains.  Look at food labels. Choose foods that have more fiber and less sugar.  Avoid processed foods and foods with added sugars. Meal planning  Aim to have the same amount of carbohydrates at each meal and for each snack time.  Plan to have regular, balanced meals and snacks. Where to find more information  American Diabetes Association: www.diabetes.org  Centers for Disease Control and Prevention: http://www.wolf.info/ Summary  Carbohydrate  counting is a method of keeping track of how many carbohydrates you eat.  Eating carbohydrates naturally increases the amount of sugar (glucose) in the blood.  Counting how many carbohydrates you eat improves your blood glucose control, which helps you manage your diabetes.  A dietitian can help you make a meal plan and calculate how many carbohydrates you should have at each meal and snack. This information is not intended to replace advice given to you by your health care provider. Make sure you discuss any questions you have with your health care provider. Document Revised: 03/11/2019 Document Reviewed: 03/12/2019 Elsevier Patient Education  2021 [employer].

## 2020-07-28 DIAGNOSIS — Z3A32 32 weeks gestation of pregnancy: Secondary | ICD-10-CM

## 2020-07-28 DIAGNOSIS — O3429 Maternal care due to uterine scar from other previous surgery: Secondary | ICD-10-CM

## 2020-07-28 DIAGNOSIS — O3413 Maternal care for benign tumor of corpus uteri, third trimester: Secondary | ICD-10-CM

## 2020-07-28 DIAGNOSIS — O09523 Supervision of elderly multigravida, third trimester: Secondary | ICD-10-CM

## 2020-07-28 DIAGNOSIS — O99213 Obesity complicating pregnancy, third trimester: Secondary | ICD-10-CM

## 2020-07-28 DIAGNOSIS — O09813 Supervision of pregnancy resulting from assisted reproductive technology, third trimester: Secondary | ICD-10-CM

## 2020-07-28 DIAGNOSIS — O10013 Pre-existing essential hypertension complicating pregnancy, third trimester: Secondary | ICD-10-CM

## 2020-07-28 DIAGNOSIS — D259 Leiomyoma of uterus, unspecified: Secondary | ICD-10-CM

## 2020-07-28 DIAGNOSIS — E039 Hypothyroidism, unspecified: Secondary | ICD-10-CM

## 2020-07-28 DIAGNOSIS — E669 Obesity, unspecified: Secondary | ICD-10-CM

## 2020-07-28 DIAGNOSIS — O288 Other abnormal findings on antenatal screening of mother: Secondary | ICD-10-CM | POA: Diagnosis not present

## 2020-07-28 DIAGNOSIS — O99283 Endocrine, nutritional and metabolic diseases complicating pregnancy, third trimester: Secondary | ICD-10-CM

## 2020-08-02 ENCOUNTER — Encounter (HOSPITAL_COMMUNITY): Payer: Self-pay | Admitting: Obstetrics and Gynecology

## 2020-08-02 ENCOUNTER — Inpatient Hospital Stay (HOSPITAL_BASED_OUTPATIENT_CLINIC_OR_DEPARTMENT_OTHER): Payer: No Typology Code available for payment source

## 2020-08-02 ENCOUNTER — Inpatient Hospital Stay (HOSPITAL_COMMUNITY)
Admission: AD | Admit: 2020-08-02 | Discharge: 2020-08-02 | Disposition: A | Discharge status: Home / Self Care | Payer: No Typology Code available for payment source | Attending: Obstetrics and Gynecology | Admitting: Obstetrics and Gynecology

## 2020-08-02 DIAGNOSIS — O368330 Maternal care for abnormalities of the fetal heart rate or rhythm, third trimester, not applicable or unspecified: Secondary | ICD-10-CM | POA: Insufficient documentation

## 2020-08-02 DIAGNOSIS — O10913 Unspecified pre-existing hypertension complicating pregnancy, third trimester: Secondary | ICD-10-CM | POA: Diagnosis not present

## 2020-08-02 DIAGNOSIS — E669 Obesity, unspecified: Secondary | ICD-10-CM | POA: Insufficient documentation

## 2020-08-02 DIAGNOSIS — Z3483 Encounter for supervision of other normal pregnancy, third trimester: Secondary | ICD-10-CM | POA: Diagnosis not present

## 2020-08-02 DIAGNOSIS — O09529 Supervision of elderly multigravida, unspecified trimester: Secondary | ICD-10-CM | POA: Diagnosis not present

## 2020-08-02 DIAGNOSIS — O09813 Supervision of pregnancy resulting from assisted reproductive technology, third trimester: Secondary | ICD-10-CM | POA: Diagnosis not present

## 2020-08-02 DIAGNOSIS — O288 Other abnormal findings on antenatal screening of mother: Secondary | ICD-10-CM

## 2020-08-02 DIAGNOSIS — Z7982 Long term (current) use of aspirin: Secondary | ICD-10-CM | POA: Insufficient documentation

## 2020-08-02 DIAGNOSIS — Z79899 Other long term (current) drug therapy: Secondary | ICD-10-CM | POA: Insufficient documentation

## 2020-08-02 DIAGNOSIS — O403XX0 Polyhydramnios, third trimester, not applicable or unspecified: Secondary | ICD-10-CM | POA: Diagnosis not present

## 2020-08-02 DIAGNOSIS — O99283 Endocrine, nutritional and metabolic diseases complicating pregnancy, third trimester: Secondary | ICD-10-CM | POA: Diagnosis not present

## 2020-08-02 DIAGNOSIS — O10919 Unspecified pre-existing hypertension complicating pregnancy, unspecified trimester: Secondary | ICD-10-CM

## 2020-08-02 DIAGNOSIS — O163 Unspecified maternal hypertension, third trimester: Secondary | ICD-10-CM | POA: Diagnosis not present

## 2020-08-02 DIAGNOSIS — Z3A33 33 weeks gestation of pregnancy: Secondary | ICD-10-CM | POA: Diagnosis not present

## 2020-08-02 DIAGNOSIS — O99213 Obesity complicating pregnancy, third trimester: Secondary | ICD-10-CM | POA: Diagnosis not present

## 2020-08-02 DIAGNOSIS — O24410 Gestational diabetes mellitus in pregnancy, diet controlled: Secondary | ICD-10-CM | POA: Diagnosis not present

## 2020-08-02 DIAGNOSIS — E039 Hypothyroidism, unspecified: Secondary | ICD-10-CM | POA: Diagnosis not present

## 2020-08-02 DIAGNOSIS — O09523 Supervision of elderly multigravida, third trimester: Secondary | ICD-10-CM | POA: Diagnosis not present

## 2020-08-02 DIAGNOSIS — O3413 Maternal care for benign tumor of corpus uteri, third trimester: Secondary | ICD-10-CM | POA: Insufficient documentation

## 2020-08-02 DIAGNOSIS — D259 Leiomyoma of uterus, unspecified: Secondary | ICD-10-CM | POA: Diagnosis not present

## 2020-08-02 DIAGNOSIS — O3429 Maternal care due to uterine scar from other previous surgery: Secondary | ICD-10-CM | POA: Diagnosis not present

## 2020-08-02 DIAGNOSIS — Z7951 Long term (current) use of inhaled steroids: Secondary | ICD-10-CM | POA: Insufficient documentation

## 2020-08-02 DIAGNOSIS — O24419 Gestational diabetes mellitus in pregnancy, unspecified control: Secondary | ICD-10-CM | POA: Diagnosis not present

## 2020-08-02 MED ORDER — LABETALOL HCL 100 MG PO TABS
400.0000 mg | ORAL_TABLET | Freq: Once | ORAL | Status: AC
  Administered 2020-08-02: 400 mg via ORAL
  Filled 2020-08-02: qty 4

## 2020-08-02 NOTE — MAU Note (Signed)
PT sent from office for BPP per Dr. Garwin Brothers. Reports she failed her NST.  Denies cntrx, LOF, VB. +FM
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
## 2020-08-02 NOTE — Discharge Instructions (Signed)
Hypertension During Pregnancy Hypertension is also called high blood pressure. High blood pressure means that the force of the blood moving in your body is high enough to cause problems for you and your baby. Different types of high blood pressure can happen during pregnancy. The types are:  High blood pressure before you got pregnant. This is called chronic hypertension.  This can continue during your pregnancy. Your doctor will want to keep checking your blood pressure. You may need medicine to control your blood pressure while you are pregnant. You will need follow-up visits after you have your baby.  High blood pressure that goes up during pregnancy when it was normal before. This is called gestational hypertension. It will often get better after you have your baby, but your doctor will need to watch your blood pressure to make sure that it is getting better.  You may develop high blood pressure after giving birth. This is called postpartum hypertension. This often occurs within 48 hours after childbirth but may occur up to 6 weeks after giving birth. Very high blood pressure during pregnancy is an emergency that needs treatment right away. How does this affect me? If you have high blood pressure during pregnancy, you have a higher chance of developing high blood pressure:  As you get older.  If you get pregnant again. In some cases, high blood pressure during pregnancy can cause:  Stroke.  Heart attack.  Damage to the kidneys, lungs, or liver.  Preeclampsia.  HELLP syndrome.  Seizures.  Problems with the placenta. How does this affect my baby? Your baby may:  Be born early.  Not weigh as much as he or she should.  Not handle labor well, leading to a C-section. This condition may also result in a baby's death before birth (stillbirth). What are the risks?  Having high blood pressure during a past pregnancy.  Being overweight.  Being age 35 or older.  Being pregnant  for the first time.  Being pregnant with more than one baby.  Becoming pregnant using fertility methods, such as IVF.  Having other problems, such as diabetes or kidney disease. What can I do to lower my risk?  Keep a healthy weight.  Eat a healthy diet.  Follow what your doctor tells you about treating any medical problems that you had before you got pregnant. It is very important to go to all of your doctor visits. Your doctor will check your blood pressure and make sure that your pregnancy is progressing as it should. Treatment should start early if a problem is found.   How is this treated? Treatment for high blood pressure during pregnancy can vary. It depends on the type of high blood pressure you have and how serious it is.  If you were taking medicine for your blood pressure before you got pregnant, talk with your doctor. You may need to change the medicine during pregnancy if it is not safe for your baby.  If your blood pressure goes up during pregnancy, your doctor may order medicine to treat this.  If you are at risk for preeclampsia, your doctor may tell you to take a low-dose aspirin while you are pregnant.  If you have very high blood pressure, you may need to stay in the hospital so you and your baby can be watched closely. You may also need to take medicine to lower your blood pressure.  In some cases, if your condition gets worse, you may need to have your baby early.   Follow these instructions at home: Eating and drinking  Drink enough fluid to keep your pee (urine) pale yellow.  Avoid caffeine.   Lifestyle  Do not smoke or use any products that contain nicotine or tobacco. If you need help quitting, ask your doctor.  Do not use alcohol or drugs.  Avoid stress.  Rest and get plenty of sleep.  Regular exercise can help. Ask your doctor what kinds of exercise are best for you. General instructions  Take over-the-counter and prescription medicines only as  told by your doctor.  Keep all prenatal and follow-up visits. Contact a doctor if:  You have symptoms that your doctor told you to watch for, such as: ? Headaches. ? A feeling like you may vomit (nausea). ? Vomiting. ? Belly (abdominal) pain. ? Feeling dizzy or light-headed. Get help right away if:  You have symptoms of serious problems, such as: ? Very bad belly pain that does not get better with treatment. ? A very bad headache that does not get better. ? Blurry vision. ? Double vision. ? Vomiting that does not get better. ? Sudden, fast weight gain. ? Sudden swelling in your hands, ankles, or face. ? Bleeding from your vagina. ? Blood in your pee. ? Shortness of breath. ? Chest pain. ? Weakness on one side of your body. ? Trouble talking.  Your baby is not moving as much as usual. These symptoms may be an emergency. Get help right away. Call your local emergency services (911 in the [place]  Do not wait to see if the symptoms will go away.  Do not drive yourself to the hospital. Summary  High blood pressure is also called hypertension.  High blood pressure means that the force of the blood moving in your body is high enough to cause problems for you and your baby.  Get help right away if you have symptoms of serious problems due to high blood pressure.  Keep all prenatal and follow-up visits. This information is not intended to replace advice given to you by your health care provider. Make sure you discuss any questions you have with your health care provider. Document Revised: 12/02/2019 Document Reviewed: 12/02/2019 Elsevier Patient Education  2021 [employer]  

## 2020-08-03 DIAGNOSIS — O09813 Supervision of pregnancy resulting from assisted reproductive technology, third trimester: Secondary | ICD-10-CM | POA: Diagnosis not present

## 2020-08-03 DIAGNOSIS — O3429 Maternal care due to uterine scar from other previous surgery: Secondary | ICD-10-CM

## 2020-08-03 DIAGNOSIS — O10013 Pre-existing essential hypertension complicating pregnancy, third trimester: Secondary | ICD-10-CM

## 2020-08-03 DIAGNOSIS — O289 Unspecified abnormal findings on antenatal screening of mother: Secondary | ICD-10-CM

## 2020-08-03 DIAGNOSIS — O322XX0 Maternal care for transverse and oblique lie, not applicable or unspecified: Secondary | ICD-10-CM

## 2020-08-03 DIAGNOSIS — Z3A33 33 weeks gestation of pregnancy: Secondary | ICD-10-CM

## 2020-08-03 DIAGNOSIS — O99213 Obesity complicating pregnancy, third trimester: Secondary | ICD-10-CM

## 2020-08-03 DIAGNOSIS — E039 Hypothyroidism, unspecified: Secondary | ICD-10-CM

## 2020-08-03 DIAGNOSIS — O99283 Endocrine, nutritional and metabolic diseases complicating pregnancy, third trimester: Secondary | ICD-10-CM | POA: Diagnosis not present

## 2020-08-03 DIAGNOSIS — E669 Obesity, unspecified: Secondary | ICD-10-CM

## 2020-08-03 DIAGNOSIS — O09523 Supervision of elderly multigravida, third trimester: Secondary | ICD-10-CM

## 2020-08-08 DIAGNOSIS — O3429 Maternal care due to uterine scar from other previous surgery: Secondary | ICD-10-CM | POA: Diagnosis not present

## 2020-08-08 DIAGNOSIS — O99213 Obesity complicating pregnancy, third trimester: Secondary | ICD-10-CM | POA: Diagnosis not present

## 2020-08-08 DIAGNOSIS — O24419 Gestational diabetes mellitus in pregnancy, unspecified control: Secondary | ICD-10-CM | POA: Diagnosis not present

## 2020-08-08 DIAGNOSIS — O403XX0 Polyhydramnios, third trimester, not applicable or unspecified: Secondary | ICD-10-CM | POA: Diagnosis not present

## 2020-08-08 DIAGNOSIS — O09819 Supervision of pregnancy resulting from assisted reproductive technology, unspecified trimester: Secondary | ICD-10-CM | POA: Diagnosis not present

## 2020-08-08 DIAGNOSIS — O99283 Endocrine, nutritional and metabolic diseases complicating pregnancy, third trimester: Secondary | ICD-10-CM | POA: Diagnosis not present

## 2020-08-08 DIAGNOSIS — Z3483 Encounter for supervision of other normal pregnancy, third trimester: Secondary | ICD-10-CM | POA: Diagnosis not present

## 2020-08-08 DIAGNOSIS — O163 Unspecified maternal hypertension, third trimester: Secondary | ICD-10-CM | POA: Diagnosis not present

## 2020-08-08 DIAGNOSIS — O09529 Supervision of elderly multigravida, unspecified trimester: Secondary | ICD-10-CM | POA: Diagnosis not present

## 2020-08-10 ENCOUNTER — Ambulatory Visit: Payer: No Typology Code available for payment source | Attending: Obstetrics

## 2020-08-10 ENCOUNTER — Other Ambulatory Visit: Payer: Self-pay

## 2020-08-10 ENCOUNTER — Other Ambulatory Visit: Payer: Self-pay | Admitting: Obstetrics and Gynecology

## 2020-08-10 ENCOUNTER — Ambulatory Visit: Payer: No Typology Code available for payment source | Admitting: *Deleted

## 2020-08-10 VITALS — BP 157/83 | HR 95

## 2020-08-10 DIAGNOSIS — O24419 Gestational diabetes mellitus in pregnancy, unspecified control: Secondary | ICD-10-CM | POA: Diagnosis not present

## 2020-08-10 DIAGNOSIS — O10013 Pre-existing essential hypertension complicating pregnancy, third trimester: Secondary | ICD-10-CM

## 2020-08-10 DIAGNOSIS — I10 Essential (primary) hypertension: Secondary | ICD-10-CM | POA: Diagnosis present

## 2020-08-10 DIAGNOSIS — D259 Leiomyoma of uterus, unspecified: Secondary | ICD-10-CM

## 2020-08-10 DIAGNOSIS — O403XX0 Polyhydramnios, third trimester, not applicable or unspecified: Secondary | ICD-10-CM

## 2020-08-10 DIAGNOSIS — E039 Hypothyroidism, unspecified: Secondary | ICD-10-CM

## 2020-08-10 DIAGNOSIS — O3429 Maternal care due to uterine scar from other previous surgery: Secondary | ICD-10-CM

## 2020-08-10 DIAGNOSIS — O09529 Supervision of elderly multigravida, unspecified trimester: Secondary | ICD-10-CM | POA: Diagnosis not present

## 2020-08-10 DIAGNOSIS — O99283 Endocrine, nutritional and metabolic diseases complicating pregnancy, third trimester: Secondary | ICD-10-CM

## 2020-08-10 DIAGNOSIS — O09523 Supervision of elderly multigravida, third trimester: Secondary | ICD-10-CM | POA: Diagnosis not present

## 2020-08-10 DIAGNOSIS — O3413 Maternal care for benign tumor of corpus uteri, third trimester: Secondary | ICD-10-CM

## 2020-08-10 DIAGNOSIS — Z3A34 34 weeks gestation of pregnancy: Secondary | ICD-10-CM

## 2020-08-11 ENCOUNTER — Other Ambulatory Visit: Payer: Self-pay | Admitting: *Deleted

## 2020-08-11 DIAGNOSIS — O10919 Unspecified pre-existing hypertension complicating pregnancy, unspecified trimester: Secondary | ICD-10-CM

## 2020-08-15 ENCOUNTER — Other Ambulatory Visit: Payer: Self-pay

## 2020-08-15 ENCOUNTER — Inpatient Hospital Stay (HOSPITAL_BASED_OUTPATIENT_CLINIC_OR_DEPARTMENT_OTHER): Payer: No Typology Code available for payment source

## 2020-08-15 ENCOUNTER — Encounter (HOSPITAL_COMMUNITY): Payer: Self-pay | Admitting: Obstetrics and Gynecology

## 2020-08-15 ENCOUNTER — Inpatient Hospital Stay (HOSPITAL_COMMUNITY)
Admission: AD | Admit: 2020-08-15 | Discharge: 2020-08-15 | Disposition: A | Discharge status: Home / Self Care | Payer: No Typology Code available for payment source | Attending: Obstetrics and Gynecology | Admitting: Obstetrics and Gynecology

## 2020-08-15 DIAGNOSIS — O99613 Diseases of the digestive system complicating pregnancy, third trimester: Secondary | ICD-10-CM | POA: Diagnosis not present

## 2020-08-15 DIAGNOSIS — Z7951 Long term (current) use of inhaled steroids: Secondary | ICD-10-CM | POA: Insufficient documentation

## 2020-08-15 DIAGNOSIS — Z79899 Other long term (current) drug therapy: Secondary | ICD-10-CM | POA: Insufficient documentation

## 2020-08-15 DIAGNOSIS — Z79890 Hormone replacement therapy (postmenopausal): Secondary | ICD-10-CM | POA: Diagnosis not present

## 2020-08-15 DIAGNOSIS — O99283 Endocrine, nutritional and metabolic diseases complicating pregnancy, third trimester: Secondary | ICD-10-CM | POA: Diagnosis not present

## 2020-08-15 DIAGNOSIS — K219 Gastro-esophageal reflux disease without esophagitis: Secondary | ICD-10-CM | POA: Insufficient documentation

## 2020-08-15 DIAGNOSIS — O99213 Obesity complicating pregnancy, third trimester: Secondary | ICD-10-CM | POA: Diagnosis not present

## 2020-08-15 DIAGNOSIS — O24410 Gestational diabetes mellitus in pregnancy, diet controlled: Secondary | ICD-10-CM | POA: Diagnosis not present

## 2020-08-15 DIAGNOSIS — O10913 Unspecified pre-existing hypertension complicating pregnancy, third trimester: Secondary | ICD-10-CM | POA: Diagnosis not present

## 2020-08-15 DIAGNOSIS — Z3483 Encounter for supervision of other normal pregnancy, third trimester: Secondary | ICD-10-CM | POA: Diagnosis not present

## 2020-08-15 DIAGNOSIS — J45909 Unspecified asthma, uncomplicated: Secondary | ICD-10-CM | POA: Diagnosis not present

## 2020-08-15 DIAGNOSIS — O99513 Diseases of the respiratory system complicating pregnancy, third trimester: Secondary | ICD-10-CM | POA: Insufficient documentation

## 2020-08-15 DIAGNOSIS — O219 Vomiting of pregnancy, unspecified: Secondary | ICD-10-CM | POA: Diagnosis not present

## 2020-08-15 DIAGNOSIS — O163 Unspecified maternal hypertension, third trimester: Secondary | ICD-10-CM | POA: Diagnosis not present

## 2020-08-15 DIAGNOSIS — Z8249 Family history of ischemic heart disease and other diseases of the circulatory system: Secondary | ICD-10-CM | POA: Insufficient documentation

## 2020-08-15 DIAGNOSIS — O403XX0 Polyhydramnios, third trimester, not applicable or unspecified: Secondary | ICD-10-CM | POA: Diagnosis not present

## 2020-08-15 DIAGNOSIS — E039 Hypothyroidism, unspecified: Secondary | ICD-10-CM | POA: Diagnosis not present

## 2020-08-15 DIAGNOSIS — O24419 Gestational diabetes mellitus in pregnancy, unspecified control: Secondary | ICD-10-CM | POA: Diagnosis not present

## 2020-08-15 DIAGNOSIS — Z7982 Long term (current) use of aspirin: Secondary | ICD-10-CM | POA: Insufficient documentation

## 2020-08-15 DIAGNOSIS — O3429 Maternal care due to uterine scar from other previous surgery: Secondary | ICD-10-CM | POA: Diagnosis not present

## 2020-08-15 DIAGNOSIS — O10919 Unspecified pre-existing hypertension complicating pregnancy, unspecified trimester: Secondary | ICD-10-CM

## 2020-08-15 DIAGNOSIS — Z3A34 34 weeks gestation of pregnancy: Secondary | ICD-10-CM | POA: Insufficient documentation

## 2020-08-15 DIAGNOSIS — O09819 Supervision of pregnancy resulting from assisted reproductive technology, unspecified trimester: Secondary | ICD-10-CM | POA: Diagnosis not present

## 2020-08-15 DIAGNOSIS — O212 Late vomiting of pregnancy: Secondary | ICD-10-CM | POA: Diagnosis not present

## 2020-08-15 DIAGNOSIS — O09529 Supervision of elderly multigravida, unspecified trimester: Secondary | ICD-10-CM | POA: Diagnosis not present

## 2020-08-15 LAB — CBC WITH DIFFERENTIAL/PLATELET
Abs Immature Granulocytes: 0.09 10*3/uL — ABNORMAL HIGH (ref 0.00–0.07)
Basophils Absolute: 0.1 10*3/uL (ref 0.0–0.1)
Basophils Relative: 0 %
Eosinophils Absolute: 0.1 10*3/uL (ref 0.0–0.5)
Eosinophils Relative: 1 %
HCT: 34.8 % — ABNORMAL LOW (ref 36.0–46.0)
Hemoglobin: 11.7 g/dL — ABNORMAL LOW (ref 12.0–15.0)
Immature Granulocytes: 1 %
Lymphocytes Relative: 12 %
Lymphs Abs: 1.6 10*3/uL (ref 0.7–4.0)
MCH: 33.5 pg (ref 26.0–34.0)
MCHC: 33.6 g/dL (ref 30.0–36.0)
MCV: 99.7 fL (ref 80.0–100.0)
Monocytes Absolute: 1.1 10*3/uL — ABNORMAL HIGH (ref 0.1–1.0)
Monocytes Relative: 8 %
Neutro Abs: 10.2 10*3/uL — ABNORMAL HIGH (ref 1.7–7.7)
Neutrophils Relative %: 78 %
Platelets: 238 10*3/uL (ref 150–400)
RBC: 3.49 MIL/uL — ABNORMAL LOW (ref 3.87–5.11)
RDW: 14.2 % (ref 11.5–15.5)
WBC: 13.1 10*3/uL — ABNORMAL HIGH (ref 4.0–10.5)
nRBC: 0 % (ref 0.0–0.2)

## 2020-08-15 LAB — URINALYSIS, ROUTINE W REFLEX MICROSCOPIC
Bilirubin Urine: NEGATIVE
Glucose, UA: NEGATIVE mg/dL
Hgb urine dipstick: NEGATIVE
Ketones, ur: 5 mg/dL — AB
Leukocytes,Ua: NEGATIVE
Nitrite: NEGATIVE
Protein, ur: 30 mg/dL — AB
Specific Gravity, Urine: 1.019 (ref 1.005–1.030)
pH: 5 (ref 5.0–8.0)

## 2020-08-15 LAB — COMPREHENSIVE METABOLIC PANEL
ALT: 13 U/L (ref 0–44)
AST: 23 U/L (ref 15–41)
Albumin: 2.7 g/dL — ABNORMAL LOW (ref 3.5–5.0)
Alkaline Phosphatase: 123 U/L (ref 38–126)
Anion gap: 8 (ref 5–15)
BUN: 7 mg/dL (ref 6–20)
CO2: 20 mmol/L — ABNORMAL LOW (ref 22–32)
Calcium: 8.7 mg/dL — ABNORMAL LOW (ref 8.9–10.3)
Chloride: 108 mmol/L (ref 98–111)
Creatinine, Ser: 0.75 mg/dL (ref 0.44–1.00)
GFR, Estimated: >60 mL/min (ref >60)
Glucose, Bld: 101 mg/dL — ABNORMAL HIGH (ref 70–99)
Potassium: 3.3 mmol/L — ABNORMAL LOW (ref 3.5–5.1)
Sodium: 136 mmol/L (ref 135–145)
Total Bilirubin: 0.8 mg/dL (ref 0.3–1.2)
Total Protein: 5.6 g/dL — ABNORMAL LOW (ref 6.5–8.1)

## 2020-08-15 LAB — PROTEIN / CREATININE RATIO, URINE
Creatinine, Urine: 230.07 mg/dL
Protein Creatinine Ratio: 0.15 mg/mg{Cre} (ref 0.00–0.15)
Total Protein, Urine: 34 mg/dL

## 2020-08-15 LAB — URIC ACID: Uric Acid, Serum: 5.2 mg/dL (ref 2.5–7.1)

## 2020-08-15 MED ORDER — POTASSIUM CHLORIDE CRYS ER 20 MEQ PO TBCR: 40.0000 meq | EXTENDED_RELEASE_TABLET | Freq: Two times a day (BID) | ORAL | 0 refills | Status: DC

## 2020-08-15 MED ORDER — PANTOPRAZOLE SODIUM 20 MG PO TBEC: 40.0000 mg | DELAYED_RELEASE_TABLET | Freq: Every day | ORAL | 0 refills | Status: DC

## 2020-08-15 MED ORDER — PANTOPRAZOLE SODIUM 40 MG PO TBEC
40.0000 mg | DELAYED_RELEASE_TABLET | Freq: Every day | ORAL | Status: DC
  Administered 2020-08-15: 40 mg via ORAL
  Filled 2020-08-15: qty 1

## 2020-08-15 MED ORDER — PROMETHAZINE HCL 25 MG RE SUPP: 25.0000 mg | Freq: Four times a day (QID) | RECTAL | 0 refills | Status: DC | PRN

## 2020-08-15 MED ORDER — POTASSIUM CHLORIDE CRYS ER 20 MEQ PO TBCR
40.0000 meq | EXTENDED_RELEASE_TABLET | Freq: Two times a day (BID) | ORAL | Status: DC
  Administered 2020-08-15: 40 meq via ORAL
  Filled 2020-08-15: qty 2

## 2020-08-15 MED ORDER — LABETALOL HCL 100 MG PO TABS
400.0000 mg | ORAL_TABLET | Freq: Two times a day (BID) | ORAL | Status: DC
  Administered 2020-08-15: 400 mg via ORAL
  Filled 2020-08-15: qty 4

## 2020-08-15 MED ORDER — PROMETHAZINE HCL 25 MG RE SUPP
25.0000 mg | Freq: Once | RECTAL | Status: AC
  Administered 2020-08-15: 25 mg via RECTAL
  Filled 2020-08-15: qty 1

## 2020-08-15 NOTE — Discharge Instructions (Signed)
Preeclampsia warning signs

## 2020-08-15 NOTE — MAU Note (Signed)
April Dyer is a 51 y.o. at [redacted]w[redacted]d here in MAU reporting: has been vomiting daily for the past 5 days. 2 episodes of vomiting today. Has a mild headache today and some floaters in her vision. No contractions, vaginal bleeding, or LOF. +FM  Onset of complaint: ongoing  Pain score: 6/10  Vitals:   08/15/20 1739  BP: (!) 149/79  Pulse: 91  Resp: 16  Temp: 98.2 F (36.8 C)  SpO2: 98%     FHT: 130  Lab orders placed from triage: labs and urine orders entered
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
## 2020-08-16 DIAGNOSIS — Z3A34 34 weeks gestation of pregnancy: Secondary | ICD-10-CM

## 2020-08-16 DIAGNOSIS — O09523 Supervision of elderly multigravida, third trimester: Secondary | ICD-10-CM

## 2020-08-16 DIAGNOSIS — O10013 Pre-existing essential hypertension complicating pregnancy, third trimester: Secondary | ICD-10-CM

## 2020-08-17 ENCOUNTER — Ambulatory Visit: Payer: No Typology Code available for payment source

## 2020-08-18 DIAGNOSIS — R69 Illness, unspecified: Secondary | ICD-10-CM | POA: Diagnosis not present

## 2020-08-21 DIAGNOSIS — R69 Illness, unspecified: Secondary | ICD-10-CM | POA: Diagnosis not present

## 2020-08-22 DIAGNOSIS — O3429 Maternal care due to uterine scar from other previous surgery: Secondary | ICD-10-CM | POA: Diagnosis not present

## 2020-08-22 DIAGNOSIS — Z3483 Encounter for supervision of other normal pregnancy, third trimester: Secondary | ICD-10-CM | POA: Diagnosis not present

## 2020-08-22 DIAGNOSIS — Z3685 Encounter for antenatal screening for Streptococcus B: Secondary | ICD-10-CM | POA: Diagnosis not present

## 2020-08-22 DIAGNOSIS — O403XX0 Polyhydramnios, third trimester, not applicable or unspecified: Secondary | ICD-10-CM | POA: Diagnosis not present

## 2020-08-22 DIAGNOSIS — O24419 Gestational diabetes mellitus in pregnancy, unspecified control: Secondary | ICD-10-CM | POA: Diagnosis not present

## 2020-08-22 DIAGNOSIS — O09819 Supervision of pregnancy resulting from assisted reproductive technology, unspecified trimester: Secondary | ICD-10-CM | POA: Diagnosis not present

## 2020-08-22 DIAGNOSIS — O163 Unspecified maternal hypertension, third trimester: Secondary | ICD-10-CM | POA: Diagnosis not present

## 2020-08-22 DIAGNOSIS — O09529 Supervision of elderly multigravida, unspecified trimester: Secondary | ICD-10-CM | POA: Diagnosis not present

## 2020-08-22 DIAGNOSIS — O99283 Endocrine, nutritional and metabolic diseases complicating pregnancy, third trimester: Secondary | ICD-10-CM | POA: Diagnosis not present

## 2020-08-22 DIAGNOSIS — O99213 Obesity complicating pregnancy, third trimester: Secondary | ICD-10-CM | POA: Diagnosis not present

## 2020-08-24 ENCOUNTER — Encounter (HOSPITAL_COMMUNITY): Payer: Self-pay

## 2020-08-24 NOTE — Patient Instructions (Signed)
April Dyer  08/24/2020   Your procedure is scheduled on:  08/31/2020  Arrive at 39 at Entrance C on [address] at [facility]  and [employer] [employer]. You are invited to use the FREE valet parking or use the Visitor's parking deck.  Pick up the phone at the desk and dial [phone].  Call this number if you have problems the morning of surgery: [phone]  Remember:   Do not eat food:(After Midnight) Desps de medianoche.  Do not drink clear liquids: (After Midnight) Desps de medianoche.  Take these medicines the morning of surgery with A SIP OF WATER:  Take labetalol, synthroid and protonix as prescribed   Do not wear jewelry, make-up or nail polish.  Do not wear lotions, powders, or perfumes. Do not wear deodorant.  Do not shave 48 hours prior to surgery.  Do not bring valuables to the hospital.  [facility] is not   responsible for any belongings or valuables brought to the hospital.  Contacts, dentures or bridgework may not be worn into surgery.  Leave suitcase in the car. After surgery it may be brought to your room.  For patients admitted to the hospital, checkout time is 11:00 AM the day of              discharge.      Please read over the following fact sheets that you were given:     Preparing for Surgery

## 2020-08-25 ENCOUNTER — Other Ambulatory Visit: Payer: Self-pay

## 2020-08-25 ENCOUNTER — Encounter: Payer: Self-pay | Admitting: *Deleted

## 2020-08-25 ENCOUNTER — Ambulatory Visit: Payer: 59 | Admitting: *Deleted

## 2020-08-25 ENCOUNTER — Ambulatory Visit: Payer: 59 | Attending: Obstetrics

## 2020-08-25 VITALS — BP 140/62 | HR 83

## 2020-08-25 DIAGNOSIS — O3413 Maternal care for benign tumor of corpus uteri, third trimester: Secondary | ICD-10-CM | POA: Diagnosis not present

## 2020-08-25 DIAGNOSIS — R69 Illness, unspecified: Secondary | ICD-10-CM | POA: Diagnosis not present

## 2020-08-25 DIAGNOSIS — Z3A36 36 weeks gestation of pregnancy: Secondary | ICD-10-CM

## 2020-08-25 DIAGNOSIS — O10013 Pre-existing essential hypertension complicating pregnancy, third trimester: Secondary | ICD-10-CM | POA: Diagnosis not present

## 2020-08-25 DIAGNOSIS — O09523 Supervision of elderly multigravida, third trimester: Secondary | ICD-10-CM | POA: Diagnosis not present

## 2020-08-25 DIAGNOSIS — O10919 Unspecified pre-existing hypertension complicating pregnancy, unspecified trimester: Secondary | ICD-10-CM | POA: Diagnosis not present

## 2020-08-25 DIAGNOSIS — D259 Leiomyoma of uterus, unspecified: Secondary | ICD-10-CM

## 2020-08-25 DIAGNOSIS — O10913 Unspecified pre-existing hypertension complicating pregnancy, third trimester: Secondary | ICD-10-CM | POA: Diagnosis not present

## 2020-08-29 ENCOUNTER — Encounter (HOSPITAL_COMMUNITY)
Admission: RE | Admit: 2020-08-29 | Discharge: 2020-08-29 | Disposition: A | Discharge status: Home / Self Care | Payer: 59 | Source: Ambulatory Visit | Attending: Obstetrics and Gynecology | Admitting: Obstetrics and Gynecology

## 2020-08-29 ENCOUNTER — Other Ambulatory Visit: Payer: Self-pay

## 2020-08-29 ENCOUNTER — Other Ambulatory Visit (HOSPITAL_COMMUNITY)
Admission: RE | Admit: 2020-08-29 | Discharge: 2020-08-29 | Disposition: A | Discharge status: Home / Self Care | Payer: 59 | Source: Ambulatory Visit | Attending: Obstetrics and Gynecology | Admitting: Obstetrics and Gynecology

## 2020-08-29 DIAGNOSIS — O3429 Maternal care due to uterine scar from other previous surgery: Secondary | ICD-10-CM | POA: Diagnosis not present

## 2020-08-29 DIAGNOSIS — K219 Gastro-esophageal reflux disease without esophagitis: Secondary | ICD-10-CM | POA: Diagnosis not present

## 2020-08-29 DIAGNOSIS — O24410 Gestational diabetes mellitus in pregnancy, diet controlled: Secondary | ICD-10-CM | POA: Diagnosis not present

## 2020-08-29 DIAGNOSIS — O1002 Pre-existing essential hypertension complicating childbirth: Secondary | ICD-10-CM | POA: Diagnosis not present

## 2020-08-29 DIAGNOSIS — D219 Benign neoplasm of connective and other soft tissue, unspecified: Secondary | ICD-10-CM | POA: Diagnosis not present

## 2020-08-29 DIAGNOSIS — O99820 Streptococcus B carrier state complicating pregnancy: Secondary | ICD-10-CM | POA: Diagnosis not present

## 2020-08-29 DIAGNOSIS — O99213 Obesity complicating pregnancy, third trimester: Secondary | ICD-10-CM | POA: Diagnosis not present

## 2020-08-29 DIAGNOSIS — O3413 Maternal care for benign tumor of corpus uteri, third trimester: Secondary | ICD-10-CM | POA: Diagnosis not present

## 2020-08-29 DIAGNOSIS — O99284 Endocrine, nutritional and metabolic diseases complicating childbirth: Secondary | ICD-10-CM | POA: Diagnosis not present

## 2020-08-29 DIAGNOSIS — O403XX0 Polyhydramnios, third trimester, not applicable or unspecified: Secondary | ICD-10-CM | POA: Diagnosis not present

## 2020-08-29 DIAGNOSIS — O09819 Supervision of pregnancy resulting from assisted reproductive technology, unspecified trimester: Secondary | ICD-10-CM | POA: Diagnosis not present

## 2020-08-29 DIAGNOSIS — Z20822 Contact with and (suspected) exposure to covid-19: Secondary | ICD-10-CM | POA: Insufficient documentation

## 2020-08-29 DIAGNOSIS — O163 Unspecified maternal hypertension, third trimester: Secondary | ICD-10-CM | POA: Diagnosis not present

## 2020-08-29 DIAGNOSIS — D252 Subserosal leiomyoma of uterus: Secondary | ICD-10-CM | POA: Diagnosis not present

## 2020-08-29 DIAGNOSIS — D259 Leiomyoma of uterus, unspecified: Secondary | ICD-10-CM | POA: Diagnosis not present

## 2020-08-29 DIAGNOSIS — O24419 Gestational diabetes mellitus in pregnancy, unspecified control: Secondary | ICD-10-CM | POA: Diagnosis not present

## 2020-08-29 DIAGNOSIS — O24420 Gestational diabetes mellitus in childbirth, diet controlled: Secondary | ICD-10-CM | POA: Diagnosis not present

## 2020-08-29 DIAGNOSIS — Z3A37 37 weeks gestation of pregnancy: Secondary | ICD-10-CM | POA: Diagnosis not present

## 2020-08-29 DIAGNOSIS — Z3483 Encounter for supervision of other normal pregnancy, third trimester: Secondary | ICD-10-CM | POA: Diagnosis not present

## 2020-08-29 DIAGNOSIS — Z01812 Encounter for preprocedural laboratory examination: Secondary | ICD-10-CM | POA: Insufficient documentation

## 2020-08-29 DIAGNOSIS — O9962 Diseases of the digestive system complicating childbirth: Secondary | ICD-10-CM | POA: Diagnosis not present

## 2020-08-29 DIAGNOSIS — E039 Hypothyroidism, unspecified: Secondary | ICD-10-CM | POA: Diagnosis not present

## 2020-08-29 DIAGNOSIS — O99824 Streptococcus B carrier state complicating childbirth: Secondary | ICD-10-CM | POA: Diagnosis not present

## 2020-08-29 DIAGNOSIS — O99283 Endocrine, nutritional and metabolic diseases complicating pregnancy, third trimester: Secondary | ICD-10-CM | POA: Diagnosis not present

## 2020-08-29 DIAGNOSIS — Z8673 Personal history of transient ischemic attack (TIA), and cerebral infarction without residual deficits: Secondary | ICD-10-CM | POA: Diagnosis not present

## 2020-08-29 DIAGNOSIS — D251 Intramural leiomyoma of uterus: Secondary | ICD-10-CM | POA: Diagnosis not present

## 2020-08-29 DIAGNOSIS — O09529 Supervision of elderly multigravida, unspecified trimester: Secondary | ICD-10-CM | POA: Diagnosis not present

## 2020-08-29 HISTORY — DX: Benign neoplasm of connective and other soft tissue, unspecified: D21.9

## 2020-08-29 HISTORY — DX: Post covid-19 condition, unspecified: U09.9

## 2020-08-29 HISTORY — DX: Gestational diabetes mellitus in pregnancy, unspecified control: O24.419

## 2020-08-29 LAB — TYPE AND SCREEN
ABO/RH(D): O POS
Antibody Screen: NEGATIVE

## 2020-08-29 LAB — CBC
HCT: 35.8 % — ABNORMAL LOW (ref 36.0–46.0)
Hemoglobin: 12 g/dL (ref 12.0–15.0)
MCH: 33.5 pg (ref 26.0–34.0)
MCHC: 33.5 g/dL (ref 30.0–36.0)
MCV: 100 fL (ref 80.0–100.0)
Platelets: 222 10*3/uL (ref 150–400)
RBC: 3.58 MIL/uL — ABNORMAL LOW (ref 3.87–5.11)
RDW: 14 % (ref 11.5–15.5)
WBC: 12.1 10*3/uL — ABNORMAL HIGH (ref 4.0–10.5)
nRBC: 0 % (ref 0.0–0.2)

## 2020-08-29 LAB — RPR: RPR Ser Ql: NONREACTIVE

## 2020-08-29 LAB — SARS CORONAVIRUS 2 (TAT 6-24 HRS): SARS Coronavirus 2: NEGATIVE

## 2020-08-31 ENCOUNTER — Inpatient Hospital Stay (HOSPITAL_COMMUNITY): Payer: 59 | Admitting: Anesthesiology

## 2020-08-31 ENCOUNTER — Encounter (HOSPITAL_COMMUNITY)
Admission: AD | Disposition: A | Discharge status: Home / Self Care | Payer: Self-pay | Source: Home / Self Care | Attending: Obstetrics and Gynecology

## 2020-08-31 ENCOUNTER — Encounter (HOSPITAL_COMMUNITY): Payer: Self-pay | Admitting: Obstetrics and Gynecology

## 2020-08-31 ENCOUNTER — Inpatient Hospital Stay (HOSPITAL_COMMUNITY)
Admission: AD | Admit: 2020-08-31 | Discharge: 2020-09-03 | DRG: 787 | Disposition: A | Discharge status: Home / Self Care | Payer: 59 | Attending: Obstetrics and Gynecology | Admitting: Obstetrics and Gynecology

## 2020-08-31 ENCOUNTER — Other Ambulatory Visit: Payer: Self-pay

## 2020-08-31 DIAGNOSIS — E039 Hypothyroidism, unspecified: Secondary | ICD-10-CM | POA: Diagnosis present

## 2020-08-31 DIAGNOSIS — O9962 Diseases of the digestive system complicating childbirth: Secondary | ICD-10-CM | POA: Diagnosis present

## 2020-08-31 DIAGNOSIS — Z8673 Personal history of transient ischemic attack (TIA), and cerebral infarction without residual deficits: Secondary | ICD-10-CM | POA: Diagnosis not present

## 2020-08-31 DIAGNOSIS — O1002 Pre-existing essential hypertension complicating childbirth: Secondary | ICD-10-CM | POA: Diagnosis present

## 2020-08-31 DIAGNOSIS — D251 Intramural leiomyoma of uterus: Secondary | ICD-10-CM | POA: Diagnosis present

## 2020-08-31 DIAGNOSIS — K219 Gastro-esophageal reflux disease without esophagitis: Secondary | ICD-10-CM | POA: Diagnosis present

## 2020-08-31 DIAGNOSIS — O3413 Maternal care for benign tumor of corpus uteri, third trimester: Secondary | ICD-10-CM | POA: Diagnosis present

## 2020-08-31 DIAGNOSIS — O99284 Endocrine, nutritional and metabolic diseases complicating childbirth: Secondary | ICD-10-CM | POA: Diagnosis present

## 2020-08-31 DIAGNOSIS — D252 Subserosal leiomyoma of uterus: Secondary | ICD-10-CM | POA: Diagnosis present

## 2020-08-31 DIAGNOSIS — O3429 Maternal care due to uterine scar from other previous surgery: Principal | ICD-10-CM | POA: Diagnosis present

## 2020-08-31 DIAGNOSIS — Z3A37 37 weeks gestation of pregnancy: Secondary | ICD-10-CM | POA: Diagnosis not present

## 2020-08-31 DIAGNOSIS — O99824 Streptococcus B carrier state complicating childbirth: Secondary | ICD-10-CM | POA: Diagnosis present

## 2020-08-31 DIAGNOSIS — O24420 Gestational diabetes mellitus in childbirth, diet controlled: Secondary | ICD-10-CM | POA: Diagnosis present

## 2020-08-31 DIAGNOSIS — Z20822 Contact with and (suspected) exposure to covid-19: Secondary | ICD-10-CM | POA: Diagnosis present

## 2020-08-31 LAB — GLUCOSE, CAPILLARY
Glucose-Capillary: 92 mg/dL (ref 70–99)
Glucose-Capillary: 103 mg/dL — ABNORMAL HIGH (ref 70–99)

## 2020-08-31 SURGERY — Surgical Case
Anesthesia: Spinal | Site: Abdomen | Wound class: Clean Contaminated

## 2020-08-31 MED ORDER — FENTANYL CITRATE (PF) 100 MCG/2ML IJ SOLN
INTRAMUSCULAR | Status: DC | PRN
  Administered 2020-08-31: 15 ug via INTRATHECAL

## 2020-08-31 MED ORDER — SODIUM CHLORIDE 0.9% FLUSH: 3.0000 mL | INTRAVENOUS | Status: DC | PRN

## 2020-08-31 MED ORDER — KETOROLAC TROMETHAMINE 30 MG/ML IJ SOLN
INTRAMUSCULAR | Status: AC
  Filled 2020-08-31: qty 1

## 2020-08-31 MED ORDER — MORPHINE SULFATE (PF) 0.5 MG/ML IJ SOLN
INTRAMUSCULAR | Status: DC | PRN
  Administered 2020-08-31: .15 mg via INTRATHECAL

## 2020-08-31 MED ORDER — IBUPROFEN 600 MG PO TABS
600.0000 mg | ORAL_TABLET | Freq: Four times a day (QID) | ORAL | Status: AC
  Administered 2020-08-31 – 2020-09-03 (×12): 600 mg via ORAL
  Filled 2020-08-31 (×12): qty 1

## 2020-08-31 MED ORDER — KETOROLAC TROMETHAMINE 30 MG/ML IJ SOLN
30.0000 mg | Freq: Four times a day (QID) | INTRAMUSCULAR | Status: AC | PRN
  Administered 2020-08-31: 30 mg via INTRAVENOUS

## 2020-08-31 MED ORDER — DIPHENHYDRAMINE HCL 25 MG PO CAPS
25.0000 mg | ORAL_CAPSULE | ORAL | Status: DC | PRN
  Administered 2020-09-01: 25 mg via ORAL

## 2020-08-31 MED ORDER — PHENYLEPHRINE HCL (PRESSORS) 10 MG/ML IV SOLN
INTRAVENOUS | Status: DC | PRN
  Administered 2020-08-31: 80 ug via INTRAVENOUS
  Administered 2020-08-31 (×2): 120 ug via INTRAVENOUS
  Administered 2020-08-31 (×2): 160 ug via INTRAVENOUS
  Administered 2020-08-31: 80 ug via INTRAVENOUS
  Administered 2020-08-31: 160 ug via INTRAVENOUS
  Administered 2020-08-31 (×3): 80 ug via INTRAVENOUS

## 2020-08-31 MED ORDER — SIMETHICONE 80 MG PO CHEW: 80.0000 mg | CHEWABLE_TABLET | ORAL | Status: DC | PRN

## 2020-08-31 MED ORDER — KETOROLAC TROMETHAMINE 30 MG/ML IJ SOLN: 30.0000 mg | Freq: Four times a day (QID) | INTRAMUSCULAR | Status: AC | PRN

## 2020-08-31 MED ORDER — ACETAMINOPHEN 10 MG/ML IV SOLN
INTRAVENOUS | Status: AC
  Filled 2020-08-31: qty 100

## 2020-08-31 MED ORDER — FENTANYL CITRATE (PF) 100 MCG/2ML IJ SOLN
INTRAMUSCULAR | Status: AC
  Filled 2020-08-31: qty 2

## 2020-08-31 MED ORDER — COCONUT OIL OIL: 1.0000 "application " | TOPICAL_OIL | Status: DC | PRN

## 2020-08-31 MED ORDER — NALBUPHINE HCL 10 MG/ML IJ SOLN: 5.0000 mg | Freq: Once | INTRAMUSCULAR | Status: DC | PRN

## 2020-08-31 MED ORDER — MEPERIDINE HCL 25 MG/ML IJ SOLN: 6.2500 mg | INTRAMUSCULAR | Status: DC | PRN

## 2020-08-31 MED ORDER — MENTHOL 3 MG MT LOZG: 1.0000 | LOZENGE | OROMUCOSAL | Status: DC | PRN

## 2020-08-31 MED ORDER — POVIDONE-IODINE 10 % EX SWAB
2.0000 "application " | Freq: Once | CUTANEOUS | Status: AC
  Administered 2020-08-31: 2 via TOPICAL

## 2020-08-31 MED ORDER — SOD CITRATE-CITRIC ACID 500-334 MG/5ML PO SOLN
30.0000 mL | ORAL | Status: AC
  Administered 2020-08-31: 30 mL via ORAL

## 2020-08-31 MED ORDER — NALBUPHINE HCL 10 MG/ML IJ SOLN
5.0000 mg | INTRAMUSCULAR | Status: DC | PRN
  Administered 2020-09-01: 5 mg via SUBCUTANEOUS
  Filled 2020-08-31: qty 1

## 2020-08-31 MED ORDER — ZOLPIDEM TARTRATE 5 MG PO TABS: 5.0000 mg | ORAL_TABLET | Freq: Every evening | ORAL | Status: DC | PRN

## 2020-08-31 MED ORDER — PHENYLEPHRINE HCL-NACL 20-0.9 MG/250ML-% IV SOLN
INTRAVENOUS | Status: AC
  Filled 2020-08-31: qty 250

## 2020-08-31 MED ORDER — DIPHENHYDRAMINE HCL 25 MG PO CAPS
25.0000 mg | ORAL_CAPSULE | Freq: Four times a day (QID) | ORAL | Status: DC | PRN
  Filled 2020-08-31: qty 1

## 2020-08-31 MED ORDER — SCOPOLAMINE 1 MG/3DAYS TD PT72
MEDICATED_PATCH | TRANSDERMAL | Status: AC
  Filled 2020-08-31: qty 1

## 2020-08-31 MED ORDER — OXYTOCIN-SODIUM CHLORIDE 30-0.9 UT/500ML-% IV SOLN
2.5000 [IU]/h | INTRAVENOUS | Status: AC
  Administered 2020-08-31: 2.5 [IU]/h via INTRAVENOUS
  Filled 2020-08-31: qty 500

## 2020-08-31 MED ORDER — OXYTOCIN-SODIUM CHLORIDE 30-0.9 UT/500ML-% IV SOLN
INTRAVENOUS | Status: DC | PRN
  Administered 2020-08-31: 200 mL via INTRAVENOUS

## 2020-08-31 MED ORDER — LEVOTHYROXINE SODIUM 75 MCG PO TABS
175.0000 ug | ORAL_TABLET | Freq: Every day | ORAL | Status: DC
  Administered 2020-09-01 – 2020-09-03 (×3): 175 ug via ORAL
  Filled 2020-08-31 (×3): qty 1

## 2020-08-31 MED ORDER — SOD CITRATE-CITRIC ACID 500-334 MG/5ML PO SOLN
ORAL | Status: AC
  Filled 2020-08-31: qty 30

## 2020-08-31 MED ORDER — SODIUM CHLORIDE 0.9 % IR SOLN
Status: DC | PRN
Start: 2020-08-31 — End: 2020-08-31
  Administered 2020-08-31: 1

## 2020-08-31 MED ORDER — PROMETHAZINE HCL 25 MG/ML IJ SOLN: 6.2500 mg | INTRAMUSCULAR | Status: DC | PRN

## 2020-08-31 MED ORDER — LACTATED RINGERS IV SOLN: INTRAVENOUS | Status: DC | PRN

## 2020-08-31 MED ORDER — DIBUCAINE (PERIANAL) 1 % EX OINT: 1.0000 "application " | TOPICAL_OINTMENT | CUTANEOUS | Status: DC | PRN

## 2020-08-31 MED ORDER — SIMETHICONE 80 MG PO CHEW
80.0000 mg | CHEWABLE_TABLET | Freq: Three times a day (TID) | ORAL | Status: DC
  Administered 2020-08-31 – 2020-09-03 (×8): 80 mg via ORAL
  Filled 2020-08-31 (×8): qty 1

## 2020-08-31 MED ORDER — MORPHINE SULFATE (PF) 0.5 MG/ML IJ SOLN
INTRAMUSCULAR | Status: AC
  Filled 2020-08-31: qty 10

## 2020-08-31 MED ORDER — OXYCODONE HCL 5 MG PO TABS: 5.0000 mg | ORAL_TABLET | Freq: Once | ORAL | Status: DC | PRN

## 2020-08-31 MED ORDER — LABETALOL HCL 200 MG PO TABS
400.0000 mg | ORAL_TABLET | Freq: Two times a day (BID) | ORAL | Status: DC
  Administered 2020-08-31 – 2020-09-03 (×7): 400 mg via ORAL
  Filled 2020-08-31 (×2): qty 2
  Filled 2020-08-31: qty 4
  Filled 2020-08-31 (×4): qty 2

## 2020-08-31 MED ORDER — BUPIVACAINE IN DEXTROSE 0.75-8.25 % IT SOLN
INTRATHECAL | Status: DC | PRN
  Administered 2020-08-31: 1.6 mL via INTRATHECAL

## 2020-08-31 MED ORDER — DIPHENHYDRAMINE HCL 50 MG/ML IJ SOLN: 12.5000 mg | INTRAMUSCULAR | Status: DC | PRN

## 2020-08-31 MED ORDER — PHENYLEPHRINE HCL-NACL 20-0.9 MG/250ML-% IV SOLN
INTRAVENOUS | Status: DC | PRN
  Administered 2020-08-31: 60 ug/min via INTRAVENOUS

## 2020-08-31 MED ORDER — ACETAMINOPHEN 325 MG PO TABS: 325.0000 mg | ORAL_TABLET | ORAL | Status: DC | PRN

## 2020-08-31 MED ORDER — WITCH HAZEL-GLYCERIN EX PADS
1.0000 | MEDICATED_PAD | CUTANEOUS | Status: DC | PRN
Start: 2020-08-31 — End: 2020-09-03

## 2020-08-31 MED ORDER — SCOPOLAMINE 1 MG/3DAYS TD PT72
1.0000 | MEDICATED_PATCH | Freq: Once | TRANSDERMAL | Status: AC
  Administered 2020-08-31: 1.5 mg via TRANSDERMAL

## 2020-08-31 MED ORDER — PRENATAL MULTIVITAMIN CH
1.0000 | ORAL_TABLET | Freq: Every day | ORAL | Status: DC
  Administered 2020-09-01 – 2020-09-03 (×3): 1 via ORAL
  Filled 2020-08-31 (×3): qty 1

## 2020-08-31 MED ORDER — OXYCODONE HCL 5 MG/5ML PO SOLN: 5.0000 mg | Freq: Once | ORAL | Status: DC | PRN

## 2020-08-31 MED ORDER — NALBUPHINE HCL 10 MG/ML IJ SOLN
5.0000 mg | INTRAMUSCULAR | Status: DC | PRN
  Administered 2020-09-01: 5 mg via INTRAVENOUS
  Filled 2020-08-31: qty 1

## 2020-08-31 MED ORDER — ACETAMINOPHEN 500 MG PO TABS
1000.0000 mg | ORAL_TABLET | Freq: Four times a day (QID) | ORAL | Status: DC
  Administered 2020-09-01 – 2020-09-03 (×10): 1000 mg via ORAL
  Filled 2020-08-31 (×11): qty 2

## 2020-08-31 MED ORDER — MOMETASONE FURO-FORMOTEROL FUM 200-5 MCG/ACT IN AERO
2.0000 | INHALATION_SPRAY | Freq: Two times a day (BID) | RESPIRATORY_TRACT | Status: DC
  Administered 2020-08-31 – 2020-09-03 (×5): 2 via RESPIRATORY_TRACT
  Filled 2020-08-31: qty 8.8

## 2020-08-31 MED ORDER — FENTANYL CITRATE (PF) 100 MCG/2ML IJ SOLN: 25.0000 ug | INTRAMUSCULAR | Status: DC | PRN

## 2020-08-31 MED ORDER — ONDANSETRON HCL 4 MG/2ML IJ SOLN
4.0000 mg | Freq: Three times a day (TID) | INTRAMUSCULAR | Status: DC | PRN
  Administered 2020-08-31: 4 mg via INTRAVENOUS
  Filled 2020-08-31: qty 2

## 2020-08-31 MED ORDER — OXYTOCIN-SODIUM CHLORIDE 30-0.9 UT/500ML-% IV SOLN
INTRAVENOUS | Status: AC
  Filled 2020-08-31: qty 500

## 2020-08-31 MED ORDER — ACETAMINOPHEN 10 MG/ML IV SOLN
INTRAVENOUS | Status: DC | PRN
  Administered 2020-08-31: 1000 mg via INTRAVENOUS

## 2020-08-31 MED ORDER — BUPIVACAINE HCL (PF) 0.25 % IJ SOLN
INTRAMUSCULAR | Status: AC
  Filled 2020-08-31: qty 30

## 2020-08-31 MED ORDER — ACETAMINOPHEN 10 MG/ML IV SOLN: 1000.0000 mg | Freq: Once | INTRAVENOUS | Status: DC | PRN

## 2020-08-31 MED ORDER — CEFAZOLIN SODIUM-DEXTROSE 2-4 GM/100ML-% IV SOLN
2.0000 g | INTRAVENOUS | Status: AC
  Administered 2020-08-31: 2 g via INTRAVENOUS

## 2020-08-31 MED ORDER — CEFAZOLIN SODIUM-DEXTROSE 2-4 GM/100ML-% IV SOLN
INTRAVENOUS | Status: AC
  Filled 2020-08-31: qty 100

## 2020-08-31 MED ORDER — LACTATED RINGERS IV SOLN: INTRAVENOUS | Status: DC

## 2020-08-31 MED ORDER — LACTATED RINGERS IV BOLUS
1000.0000 mL | Freq: Once | INTRAVENOUS | Status: AC
  Administered 2020-08-31: 1000 mL via INTRAVENOUS

## 2020-08-31 MED ORDER — BUPIVACAINE HCL (PF) 0.25 % IJ SOLN
INTRAMUSCULAR | Status: DC | PRN
  Administered 2020-08-31: 10 mL

## 2020-08-31 MED ORDER — SENNOSIDES-DOCUSATE SODIUM 8.6-50 MG PO TABS
2.0000 | ORAL_TABLET | ORAL | Status: DC
  Administered 2020-08-31 – 2020-09-02 (×3): 2 via ORAL
  Filled 2020-08-31 (×3): qty 2

## 2020-08-31 MED ORDER — ACETAMINOPHEN 160 MG/5ML PO SOLN: 325.0000 mg | ORAL | Status: DC | PRN

## 2020-08-31 MED ORDER — ONDANSETRON HCL 4 MG/2ML IJ SOLN
INTRAMUSCULAR | Status: AC
  Filled 2020-08-31: qty 2

## 2020-08-31 MED ORDER — OXYCODONE HCL 5 MG PO TABS
5.0000 mg | ORAL_TABLET | ORAL | Status: DC | PRN
  Administered 2020-09-02: 5 mg via ORAL
  Filled 2020-08-31: qty 1

## 2020-08-31 MED ORDER — NALOXONE HCL 0.4 MG/ML IJ SOLN: 0.4000 mg | INTRAMUSCULAR | Status: DC | PRN

## 2020-08-31 MED ORDER — NALOXONE HCL 4 MG/10ML IJ SOLN
1.0000 ug/kg/h | INTRAVENOUS | Status: DC | PRN
  Filled 2020-08-31: qty 5

## 2020-08-31 MED ORDER — ONDANSETRON HCL 4 MG/2ML IJ SOLN
INTRAMUSCULAR | Status: DC | PRN
  Administered 2020-08-31: 4 mg via INTRAVENOUS

## 2020-08-31 SURGICAL SUPPLY — 46 items
APL SKNCLS STERI-STRIP NONHPOA (GAUZE/BANDAGES/DRESSINGS) ×1
BARRIER ADHS 3X4 INTERCEED (GAUZE/BANDAGES/DRESSINGS) ×2 IMPLANT
BENZOIN TINCTURE PRP APPL 2/3 (GAUZE/BANDAGES/DRESSINGS) ×1 IMPLANT
BRR ADH 4X3 ABS CNTRL BYND (GAUZE/BANDAGES/DRESSINGS) ×1
CHLORAPREP W/TINT 26ML (MISCELLANEOUS) ×2 IMPLANT
CLAMP CORD UMBIL (MISCELLANEOUS) IMPLANT
CLOSURE STERI STRIP 1/2 X4 (GAUZE/BANDAGES/DRESSINGS) ×1 IMPLANT
CLOTH BEACON ORANGE TIMEOUT ST (SAFETY) ×2 IMPLANT
DRAPE C SECTION CLR SCREEN (DRAPES) ×2 IMPLANT
DRSG OPSITE POSTOP 4X10 (GAUZE/BANDAGES/DRESSINGS) ×2 IMPLANT
ELECT REM PT RETURN 9FT ADLT (ELECTROSURGICAL) ×2
ELECTRODE REM PT RTRN 9FT ADLT (ELECTROSURGICAL) ×1 IMPLANT
EXTRACTOR VACUUM M CUP 4 TUBE (SUCTIONS) IMPLANT
GLOVE BIOGEL PI IND STRL 7.0 (GLOVE) ×2 IMPLANT
GLOVE BIOGEL PI INDICATOR 7.0 (GLOVE) ×2
GLOVE ECLIPSE 6.5 STRL STRAW (GLOVE) ×2 IMPLANT
GOWN STRL REUS W/TWL LRG LVL3 (GOWN DISPOSABLE) ×4 IMPLANT
KIT ABG SYR 3ML LUER SLIP (SYRINGE) IMPLANT
NDL HYPO 25X5/8 SAFETYGLIDE (NEEDLE) IMPLANT
NEEDLE HYPO 22GX1.5 SAFETY (NEEDLE) ×2 IMPLANT
NEEDLE HYPO 25X5/8 SAFETYGLIDE (NEEDLE) IMPLANT
NS IRRIG 1000ML POUR BTL (IV SOLUTION) ×2 IMPLANT
PACK C SECTION WH (CUSTOM PROCEDURE TRAY) ×2 IMPLANT
PAD OB MATERNITY 4.3X12.25 (PERSONAL CARE ITEMS) ×2 IMPLANT
RTRCTR C-SECT PINK 25CM LRG (MISCELLANEOUS) IMPLANT
SPONGE LAP 18X18 X RAY DECT (DISPOSABLE) ×1 IMPLANT
STRIP CLOSURE SKIN 1/2X4 (GAUZE/BANDAGES/DRESSINGS) IMPLANT
SUT CHROMIC GUT AB #0 18 (SUTURE) IMPLANT
SUT MNCRL 0 VIOLET CTX 36 (SUTURE) ×3 IMPLANT
SUT MON AB 2-0 SH 27 (SUTURE)
SUT MON AB 2-0 SH27 (SUTURE) IMPLANT
SUT MON AB 3-0 SH 27 (SUTURE)
SUT MON AB 3-0 SH27 (SUTURE) IMPLANT
SUT MON AB 4-0 PS1 27 (SUTURE) IMPLANT
SUT MONOCRYL 0 CTX 36 (SUTURE) ×6
SUT PLAIN 2 0 (SUTURE)
SUT PLAIN 2 0 XLH (SUTURE) IMPLANT
SUT PLAIN ABS 2-0 CT1 27XMFL (SUTURE) IMPLANT
SUT VIC AB 0 CT1 36 (SUTURE) ×4 IMPLANT
SUT VIC AB 2-0 CT1 27 (SUTURE) ×2
SUT VIC AB 2-0 CT1 TAPERPNT 27 (SUTURE) ×1 IMPLANT
SUT VIC AB 4-0 PS2 27 (SUTURE) ×1 IMPLANT
SYR CONTROL 10ML LL (SYRINGE) ×2 IMPLANT
TOWEL OR 17X24 6PK STRL BLUE (TOWEL DISPOSABLE) ×2 IMPLANT
TRAY FOLEY W/BAG SLVR 14FR LF (SET/KITS/TRAYS/PACK) IMPLANT
WATER STERILE IRR 1000ML POUR (IV SOLUTION) ×2 IMPLANT

## 2020-08-31 NOTE — Anesthesia Procedure Notes (Signed)
Spinal  Start time: 08/31/2020 11:48 AM End time: 08/31/2020 11:50 AM Reason for block: surgical anesthesia Staffing Performed: anesthesiologist  Anesthesiologist: Effie Berkshire, MD Preanesthetic Checklist Completed: patient identified, IV checked, site marked, risks and benefits discussed, surgical consent, monitors and equipment checked, pre-op evaluation and timeout performed Spinal Block Patient position: sitting Prep: DuraPrep and site prepped and draped Location: L3-4 Injection technique: single-shot Needle Needle type: Pencan  Needle gauge: 24 G Needle length: 10 cm Needle insertion depth: 10 cm Additional Notes Patient tolerated well. No immediate complications.
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
## 2020-08-31 NOTE — Anesthesia Preprocedure Evaluation (Addendum)
Anesthesia Evaluation  Patient identified by MRN, date of birth, ID band Patient awake    Reviewed: Allergy & Precautions, NPO status , Patient's Chart, lab work & pertinent test results  Airway Mallampati: II  TM Distance: >3 FB Neck ROM: Full    Dental no notable dental hx.    Pulmonary asthma , sleep apnea ,    Pulmonary exam normal        Cardiovascular hypertension, Pt. on home beta blockers  Rhythm:Regular Rate:Normal     Neuro/Psych  Headaches, PSYCHIATRIC DISORDERS Anxiety Depression    GI/Hepatic Neg liver ROS, GERD  Medicated,  Endo/Other  diabetes, Gestational  Renal/GU      Musculoskeletal   Abdominal Normal abdominal exam  (+)   Peds  Hematology   Anesthesia Other Findings   Reproductive/Obstetrics                            Anesthesia Physical Anesthesia Plan  ASA: 2  Anesthesia Plan: Spinal   Post-op Pain Management:    Induction:   PONV Risk Score and Plan: 0 and Ondansetron  Airway Management Planned: Natural Airway  Additional Equipment: None  Intra-op Plan:   Post-operative Plan:   Informed Consent: I have reviewed the patients History and Physical, chart, labs and discussed the procedure including the risks, benefits and alternatives for the proposed anesthesia with the patient or authorized representative who has indicated his/her understanding and acceptance.       Plan Discussed with: CRNA  Anesthesia Plan Comments: (Lab Results      Component                Value               Date                      WBC                      12.1 (H)            08/29/2020                HGB                      12.0                08/29/2020                HCT                      35.8 (L)            08/29/2020                MCV                      100.0               08/29/2020                PLT                      222                 08/29/2020             Echo: 1. Left ventricular ejection fraction, by estimation, is 55 to  60%. The  left ventricle has normal function. The left ventricle has no regional  wall motion abnormalities. There is mild concentric left ventricular  hypertrophy. Left ventricular diastolic  parameters were normal.  2. Right ventricular systolic function is normal. The right ventricular  size is normal. Tricuspid regurgitation signal is inadequate for assessing  PA pressure.  3. The mitral valve is grossly normal. No evidence of mitral valve  regurgitation. No evidence of mitral stenosis.  4. The aortic valve was not well visualized. Aortic valve regurgitation  is not visualized. No aortic stenosis is present.  5. The inferior vena cava is normal in size with greater than 50%  respiratory variability, suggesting right atrial pressure of 3 mmHg. )       Anesthesia Quick Evaluation

## 2020-08-31 NOTE — Anesthesia Postprocedure Evaluation (Signed)
Anesthesia Post Note  Patient: April Dyer  Procedure(s) Performed: CESAREAN SECTION (Abdomen)     Patient location during evaluation: PACU Anesthesia Type: Spinal Level of consciousness: oriented and awake and alert Pain management: pain level controlled Vital Signs Assessment: post-procedure vital signs reviewed and stable Respiratory status: spontaneous breathing, respiratory function stable and patient connected to nasal cannula oxygen Cardiovascular status: blood pressure returned to baseline and stable Postop Assessment: no headache, no backache and no apparent nausea or vomiting Anesthetic complications: no   No notable events documented.  Last Vitals:  Vitals:   08/31/20 1415 08/31/20 1423  BP: 123/76 127/80  Pulse: 79 77  Resp: 17 16  Temp: 36.4 C 36.6 C  SpO2:  97%    Last Pain:  Vitals:   08/31/20 1423  TempSrc: Oral  PainSc: 0-No pain   Pain Goal:    LLE Motor Response: Purposeful movement (08/31/20 1415)   RLE Motor Response: Purposeful movement (08/31/20 1415)       Epidural/Spinal Function Cutaneous sensation: Able to Discern Pressure (08/31/20 1415), Patient able to flex knees: Yes (08/31/20 1415), Patient able to lift hips off bed: No (08/31/20 1415), Back pain beyond tenderness at insertion site: No (08/31/20 1415), Progressively worsening motor and/or sensory loss: No (08/31/20 1415), Bowel and/or bladder incontinence post epidural: No (08/31/20 1415)  Effie Berkshire

## 2020-08-31 NOTE — Transfer of Care (Signed)
Immediate Anesthesia Transfer of Care Note  Patient: PRAKRITI CARIGNAN  Procedure(s) Performed: CESAREAN SECTION (Abdomen)  Patient Location: PACU  Anesthesia Type:Spinal  Level of Consciousness: awake, alert  and oriented  Airway & Oxygen Therapy: Patient Spontanous Breathing  Post-op Assessment: Report given to RN and Post -op Vital signs reviewed and stable  Post vital signs: Reviewed and stable  Last Vitals:  Vitals Value Taken Time  BP 99/61 08/31/20 1316  Temp    Pulse 78 08/31/20 1319  Resp 17 08/31/20 1319  SpO2 97 % 08/31/20 1319  Vitals shown include unvalidated device data.  Last Pain:  Vitals:   08/31/20 1028  TempSrc: Oral         Complications: No notable events documented.

## 2020-08-31 NOTE — Interval H&P Note (Signed)
History and Physical Interval Note:  08/31/2020 11:41 AM  April Dyer  has presented today for surgery, with the diagnosis of primary, IVF, previous myomectomy.  The various methods of treatment have been discussed with the patient and family. After consideration of risks, benefits and other options for treatment, the patient has consented to  Procedure(s): CESAREAN SECTION (N/A) as a surgical intervention.  The patient's history has been reviewed, patient examined, no change in status, stable for surgery.  I have reviewed the patient's chart and labs.  Questions were answered to the patient's satisfaction.     Danielle Mink A Dyneisha Murchison

## 2020-08-31 NOTE — Lactation Note (Signed)
This note was copied from a baby's chart. Lactation Consultation Note  Patient Name: April Dyer [id number] Date: 08/31/2020 Reason for consult: Initial assessment;Early term 37-38.6wks;1st time breastfeeding Age:50 years, ETI infant with 2 void diapers. Mom latched infant on her right breast using the football hold position, infant breastfeed for 6 minutes and then became sleepy. Afterwards infant was given 8 mls of EBM by spoon.  Mom knows how to hand express. LC discussed LPTI feeding policy with mom, infant is 36 week infant but acts more like LPTI. LC discussed infant's input and output with parents. Mom is open to using donor breast milk if she has to supplement infant Mom shown how to use DEBP & how to disassemble, clean, & reassemble parts.  Mom made aware of O/P services, breastfeeding support groups, community resources, and our phone # for post-discharge questions.   Mom's plan: 1- Mom will follow LPTI feeding instructions: BF infant according to cues, 8 to 12+ times within 24 hours, STS, not make infant wait to BF, limit total feedings to 30 minutes or less. 2- After latching infant at the breast mom will supplement infant with 5 to 10 mls of her EBM that was pumped or hand express with every feeding. 3- Mom will use DEBP every 3 hours for 15 minutes on initial setting. 4- Mom knows to call RN or LC if she has any BF questions, concerns or need further assistance with latching infant at the breast.  Maternal Data Has patient been taught Hand Expression?: Yes Does the patient have breastfeeding experience prior to this delivery?: No  Feeding Mother's Current Feeding Choice: Breast Milk  LATCH Score Latch: Grasps breast easily, tongue down, lips flanged, rhythmical sucking.  Audible Swallowing: A few with stimulation  Type of Nipple: Everted at rest and after stimulation  Comfort (Breast/Nipple): Soft / non-tender  Hold (Positioning): Assistance needed to  correctly position infant at breast and maintain latch.  LATCH Score: 8   Lactation Tools Discussed/Used Tools: Pump Breast pump type: Double-Electric Breast Pump Pump Education: Setup, frequency, and cleaning;Milk Storage Reason for Pumping: Infant is ETI infant, mom with C/S delivery, GDM, AMA Pumping frequency: Mom knows to pump every 3 hours for 15 minutes,  Interventions Interventions: Breast feeding basics reviewed;Assisted with latch;Skin to skin;Hand express;Breast massage;Pre-pump if needed;Breast compression;Adjust position;Support pillows;Position options;Expressed milk;DEBP;Education  Discharge Pump: Personal;DEBP WIC Program: No  Consult Status Consult Status: Follow-up Date: 09/01/20 Follow-up type: In-patient    Vicente Serene 08/31/2020, 11:58 PM

## 2020-08-31 NOTE — Op Note (Signed)
NAME: April Dyer, April Dyer MEDICAL RECORD NO: [id number] ACCOUNT NO: [id number] DATE OF BIRTH: December 13, 1970 FACILITY: [facility] LOCATION: [facility] PHYSICIAN: Prescious Hurless A. Garwin Brothers, MD  Operative Report   DATE OF PROCEDURE: 08/31/2020  PREOPERATIVE DIAGNOSES:  Previous myomectomy, intrauterine gestation at 47 weeks, in vitro fertilization pregnancy, advanced maternal age, chronic hypertension, class A1 gestational diabetes, Fibroid uterus.  PROCEDURE:  Primary cesarean section, Kerr hysterotomy, myomectomy.  POSTOPERATIVE DIAGNOSES:  Intramural fibroids, pedunculated subserosal fibroids, previous myomectomy, in vitro fertilization pregnancy, chronic hypertension, class A1 gestational diabetes, intrauterine gestation at 33 weeks.  ANESTHESIA:  Spinal.  SURGEON:  Joash Tony A. Garwin Brothers, MD  ASSISTANT:  None.  PROCEDURE IN DETAIL:  Under adequate spinal anesthesia, the patient was placed in supine position with a left lateral tilt.  She was sterilely prepped and draped in the usual fashion.  Indwelling Foley catheter was sterilely placed.  0.25% Marcaine was  injected along the planned Pfannenstiel skin incision site.  Pfannenstiel skin incision was then made, carried down to the rectus fascia.  The rectus fascia was opened transversely. The rectus fascia was then sharply and bluntly dissected off the rectus  muscle in the superior and inferior fashion.  The rectus muscle was split in the midline.  The parietal peritoneum was entered sharply and extended.  A self-retaining Alexis retractor was then placed.  Fibroid uterus was noted.  Vesicouterine peritoneum  was opened transversely.  A transverse incision was then made, extended bluntly in a cephalad and caudad fashion with subsequent artificial rupture of membranes, clear amniotic fluid.  However, a floating vertex was encountered, which was not easily  delivered.  Bandage scissors was used to extend the incision bilaterally.  Subsequent placement of  a vacuum facilitated delivery of a live female with a body cord.  Baby was delayed cord clamped on the abdomen.  Baby was bulb suctioned on the abdomen.   The cord was clamped and cut and baby was transferred to the waiting pediatrician who assigned Apgars of 9 and 9 at one and five minutes respectively. Placenta which was posterior was manually removed.  Uterine cavity was cleaned of debris.  On the inspection  to close the incision, there was a fibroid located in upper portion of the incision which would not allow for closure at that part of the incision. Therefore decision was made to remove it to facilitate the incision closure.  Electrocautery was then used to further expose the fibroid with enucleation of the fibroid from the base and subsequent closure of the incision in two  layers with 0 Monocryl in a running locked stitch.  Second layer was imbricated using 0 Monocryl suture.  The ovaries were noted to be normal bilaterally.  Tubes were normal bilaterally.  Abdomen was irrigated, suctioned of debris.  Interceed was placed  overlying the incision in an inverted T-fashion.  The Alexis retractor was then removed.  The parietal peritoneum was closed with 2-0 Vicryl.  The rectus fascia was closed with 0 Vicryl x2.  The subcutaneous area was irrigated, small bleeders cauterized.   Interrupted 2-0 plain suture was placed and the skin approximated using 4-0 Vicryl subcuticular closure.  Steri-Strips and benzoin was placed.  The specimen was myoma x 1, sent to pathology.  Placenta was not sent.  Estimated blood loss was 1148 mL. Urine  was 100 mL. Intraoperative fluid was 2200 mL.  Sponge and instrument counts x2 was correct.  COMPLICATIONS:  None.  The patient tolerated the procedure well and was transferred to recovery room  in stable condition. Baby was placed on skin to skin in the OR.   SHW D: 08/31/2020 1:07:26 pm T: 08/31/2020 11:13:00 pm  JOB: [id number]/ [id number]

## 2020-08-31 NOTE — Brief Op Note (Signed)
08/31/2020  1:09 PM  PATIENT:  April Dyer  50 y.o. female  PRE-OPERATIVE DIAGNOSIS:  Previous myomectomy, IUP @ 37 wk, AMA, IVF pregnancy, chronic HTN, hypothyroidism, fibroid uterus POST-OPERATIVE DIAGNOSIS: same IVF, previous myomectomy  PROCEDURE:  Primary Cesarean section, kerr hysterotomy, myomectomy  SURGEON:  Surgeon(s) and Role:    * Servando Salina, MD - Primary  PHYSICIAN ASSISTANT:   ASSISTANTS: none   ANESTHESIA:   spinal FINDINGs;  Live female  floating vtx, multiple uterine fibroids( IM/SS/pedunculated),  EBL:  1146 ml   BLOOD ADMINISTERED:none  DRAINS: none   LOCAL MEDICATIONS USED:  MARCAINE     SPECIMEN:  Source of Specimen:  myoma  DISPOSITION OF SPECIMEN:  PATHOLOGY  COUNTS:  YES  TOURNIQUET:  * No tourniquets in log *  DICTATION: .Other Dictation: Dictation Number [id number]  PLAN OF CARE: Admit to inpatient   PATIENT DISPOSITION:  PACU - hemodynamically stable.   Delay start of Pharmacological VTE agent (>24hrs) due to surgical blood loss or risk of bleeding: no

## 2020-09-01 LAB — CBC
HCT: 28 % — ABNORMAL LOW (ref 36.0–46.0)
Hemoglobin: 9.3 g/dL — ABNORMAL LOW (ref 12.0–15.0)
MCH: 33.5 pg (ref 26.0–34.0)
MCHC: 33.2 g/dL (ref 30.0–36.0)
MCV: 100.7 fL — ABNORMAL HIGH (ref 80.0–100.0)
Platelets: 189 10*3/uL (ref 150–400)
RBC: 2.78 MIL/uL — ABNORMAL LOW (ref 3.87–5.11)
RDW: 14.1 % (ref 11.5–15.5)
WBC: 11.1 10*3/uL — ABNORMAL HIGH (ref 4.0–10.5)
nRBC: 0 % (ref 0.0–0.2)

## 2020-09-01 LAB — SURGICAL PATHOLOGY

## 2020-09-01 LAB — BIRTH TISSUE RECOVERY COLLECTION (PLACENTA DONATION)

## 2020-09-01 NOTE — Lactation Note (Signed)
This note was copied from a baby's chart. Lactation Consultation Note  Patient Name: April Dyer URKYH'C Date: 09/01/2020 Reason for consult: Follow-up assessment;Early term 37-38.6wks;Primapara;1st time breastfeeding Age:52 hours   P2 mother whose infant is now 84 hours old.  This is an ETI at 37+0 weeks.    Baby was swaddled and asleep when I arrived.  Mother last breast fed for 15 minutes stating that baby remains very sleepy.  Reviewed the "ETI" and reassured parents that this is typical behavior for a baby at this age.  Discussed basic breast feeding concepts and the need for possible supplementation after 24 hours.  Mother has been pumping and has 6 mls of EBM at bedside.  Offered to return for the next feeding to assist and provide the EBM.  Mother very interested in having assistance.  Discussed using a bottle nipple for supplementation rather than the curved tip syringe at bedside.    NP in room at the end of my visit to assess baby.  I will return at 1145 or sooner if mother calls for assistance; will continue to educate on breast feeding basics at that time.  RN updated.   Maternal Data Has patient been taught Hand Expression?: Yes Does the patient have breastfeeding experience prior to this delivery?: No  Feeding Mother's Current Feeding Choice: Breast Milk  LATCH Score Latch: Grasps breast easily, tongue down, lips flanged, rhythmical sucking.  Audible Swallowing: A few with stimulation  Type of Nipple: Everted at rest and after stimulation  Comfort (Breast/Nipple): Soft / non-tender  Hold (Positioning): Full assist, staff holds infant at breast  LATCH Score: 7   Lactation Tools Discussed/Used Tools: Pump;Flanges Flange Size: 24;27 Breast pump type: Double-Electric Breast Pump;Manual;Other (comment) Pump Education: Setup, frequency, and cleaning (No review needed) Reason for Pumping: Early term infant; supplementation Pumping frequency: Every three  hours  Interventions    Discharge Pump: DEBP;Manual;Personal WIC Program: No  Consult Status Consult Status: Follow-up Date: 09/02/20 Follow-up type: In-patient    Korin Setzler R Jermiyah Ricotta 09/01/2020, 10:44 AM

## 2020-09-01 NOTE — Lactation Note (Signed)
This note was copied from a baby's chart. Lactation Consultation Note  Patient Name: April Dyer [id number] Date: 09/01/2020 Reason for consult: Follow-up assessment;Early term 37-38.6wks;Primapara;1st time breastfeeding Age:52 hours   P2 mother whose infant is now 17 hours old.  This is an ETI at 37+0 weeks.  Returned for latch assistance as discussed earlier this morning.  Baby continues to be very sleepy.  Upon my gloved finger baby has a strong coordinated suck.  Mother demonstrated hand expression and was able to express colostrum drops which I finger fed back to baby.  Assisted to latch to the breast, however, she remained sleepy.  Mother had pumped 10 mls of EBM and used this to help entice baby to begin sucking at the breast.  She was not interested.  Removed baby from the breast and fed this volume using the syringe.  Burped and awakened baby.  Assisted to latch again and this time she became more interested.  With constant stimulation, baby fed for 8 minutes.  Mother denied pain with latching.  Reviewed feeding plan for today.  Mother very excited to see baby latching.  Praised mother for her efforts and reminded her to continue pumping for 15 minutes after every feeding.  Mother will call as needed for assistance.  RN in room and updated.  Father will assist with stimulation as needed.  Mother has a DEBP for home use.   Maternal Data Has patient been taught Hand Expression?: Yes Does the patient have breastfeeding experience prior to this delivery?: No  Feeding Mother's Current Feeding Choice: Breast Milk  LATCH Score Latch: Repeated attempts needed to sustain latch, nipple held in mouth throughout feeding, stimulation needed to elicit sucking reflex.  Audible Swallowing: A few with stimulation  Type of Nipple: Everted at rest and after stimulation (Short shafted)  Comfort (Breast/Nipple): Soft / non-tender  Hold (Positioning): Assistance needed to correctly  position infant at breast and maintain latch.  LATCH Score: 7   Lactation Tools Discussed/Used Tools: Pump;Flanges Flange Size: 24;27 Breast pump type: Double-Electric Breast Pump;Manual Pump Education: Setup, frequency, and cleaning;Milk Storage (Reviewed) Reason for Pumping: Breast stimulation and supplementation for ETI Pumping frequency: Every three hours  Interventions Interventions: Breast feeding basics reviewed;Assisted with latch;Skin to skin;Breast massage;Hand express;Pre-pump if needed;Breast compression;Adjust position;DEBP;Expressed milk;Position options;Support pillows;Education  Discharge Pump: DEBP;Manual;Personal WIC Program: No  Consult Status Consult Status: Follow-up Date: 09/02/20 Follow-up type: In-patient    Little Ishikawa 09/01/2020, 12:35 PM

## 2020-09-01 NOTE — Social Work (Signed)
CSW received consult for hx of Anxiety/Depression and Edinburgh score of 11.  CSW met with MOB to offer support and complete assessment.     CSW introduced self and role. CSW observed FOB Fred on couch and MOB holding infant 'Sandrea Matte.' CSW asked permission to speak with MOB alone. FOB was understanding and exited the room. CSW informed MOB of reason for consult and assessed current emotions. MOB was welcoming and pleasant as she shared she is doing well. MOB shared she was initially exhausted postpartum, however she was able to get some rest. CSW inquired on MOB mental health history. MOB disclosed she was diagnosed with anxiety and depression years ago. MOB stated she attends therapy once a week at [facility], which she has found to be helpful. MOB reported she plans to continue counseling services postpartum. CSW asked MOB if she has coping mechanisms that she utilizes to address symptoms depressive or anxious symptoms. MOB shared sewing and fishing helps to act as a distraction. MOB identified her spouse, mother, and friends as supports. MOB expressed she has a strong support system. MOB denies any current SI, HI or being involved in DV.   CSW provided education regarding the baby blues period versus PPD. CSW provided the New Mom Checklist and encouraged MOB to self evaluate and contact a medical professional if symptoms are noted at any time.  CSW provided review of Sudden Infant Death Syndrome (SIDS) precautions. MOB stated she has all essentials for infant, including a bassinet and car seat. MOB identified [facility] for follow-up care and denies any transportation barriers. MOB reported she has no additional needs at this time.  CSW identifies no further need for intervention and no barriers to discharge at this time.  Darra Lis, [employer] [phone]

## 2020-09-01 NOTE — Progress Notes (Signed)
SVD: primary  S:  Pt reports feeling well/ Tolerating po/ Voiding without problems/ No n/v/ Bleeding is moderate/ Pain controlled withprescription NSAID's including ibuprofen (Motrin) and narcotic analgesics including oxycodone/acetaminophen (Percocet, Tylox)    O:  A & O x 3 / VS: Blood pressure 131/68, pulse 72, temperature 98.2 F (36.8 C), temperature source Oral, resp. rate 18, height 5' 8.5" (1.74 m), weight 106.5 kg, SpO2 98 %, unknown if currently breastfeeding.  LABS:  Results for orders placed or performed during the hospital encounter of 08/31/20 (from the past 24 hour(s))  CBC     Status: Abnormal   Collection Time: 09/01/20  5:21 AM  Result Value Ref Range   WBC 11.1 (H) 4.0 - 10.5 K/uL   RBC 2.78 (L) 3.87 - 5.11 MIL/uL   Hemoglobin 9.3 (L) 12.0 - 15.0 g/dL   HCT 28.0 (L) 36.0 - 46.0 %   MCV 100.7 (H) 80.0 - 100.0 fL   MCH 33.5 26.0 - 34.0 pg   MCHC 33.2 30.0 - 36.0 g/dL   RDW 14.1 11.5 - 15.5 %   Platelets 189 150 - 400 K/uL   nRBC 0.0 0.0 - 0.2 %  Collect bld for placenta donatation     Status: None   Collection Time: 09/01/20  5:21 AM  Result Value Ref Range   Placenta donation bld collect Collected by Laboratory     I&O: I/O last 3 completed shifts: In: 2200 [I.V.:2000; IV Piggyback:200] Out: 2363 [Urine:1215; Blood:1148]   Total I/O In: -  Out: 1300 [Urine:1300]  Lungs: chest clear, no wheezing, rales, normal symmetric air entry  Heart: regular rate and rhythm, S1, S2 normal, no murmur, click, rub or gallop  Abdomen: obese soft uterus 1 FB above umb  surgical tender  Perineum: is normal  Lochia: moderate  Extremities:no redness or tenderness in the calves or thighs, no edema    A/P: POD # 1/PPD # 2/ [id number] s/p C/S Chronic HTN on labetalol Hypothyroidism on synthroid IVF preg Class A1 GDM Previous myomectomy  Doing well  Continue routine post partum orders Cont labetalol Cont synthroid

## 2020-09-02 NOTE — Lactation Note (Signed)
This note was copied from a baby's chart. Lactation Consultation Note  Patient Name: April Dyer EVOJJ'K Date: 09/02/2020 Age:52 hours  Flint Melter RN consulted [place] via vocera regarding Oxycodone during breastfeeding. LC provided information from [facility] "Medication and Mother's Milk". Per [facility], medication is safe for breastfeeding babies. Encouraged monitoring for mild sedation.   Luca Burston A Higuera Ancidey 09/02/2020, 10:09 PM

## 2020-09-02 NOTE — Plan of Care (Signed)
  Problem: Education: Goal: Knowledge of General Education information will improve Description: Including pain rating scale, medication(s)/side effects and non-pharmacologic comfort measures Outcome: Adequate for Discharge   Problem: Clinical Measurements: Goal: Ability to maintain clinical measurements within normal limits will improve Outcome: Adequate for Discharge Goal: Diagnostic test results will improve Outcome: Adequate for Discharge Goal: Respiratory complications will improve Outcome: Adequate for Discharge Goal: Cardiovascular complication will be avoided Outcome: Adequate for Discharge   Problem: Activity: Goal: Risk for activity intolerance will decrease Outcome: Adequate for Discharge   Problem: Nutrition: Goal: Adequate nutrition will be maintained Outcome: Adequate for Discharge   Problem: Elimination: Goal: Will not experience complications related to urinary retention Outcome: Adequate for Discharge   Problem: Pain Managment: Goal: General experience of comfort will improve Outcome: Adequate for Discharge   Problem: Safety: Goal: Ability to remain free from injury will improve Outcome: Adequate for Discharge   Problem: Activity: Goal: Will verbalize the importance of balancing activity with adequate rest periods Outcome: Adequate for Discharge

## 2020-09-02 NOTE — Lactation Note (Signed)
This note was copied from a baby's chart. Lactation Consultation Note  Patient Name: April Dyer [id number] Date: 09/02/2020 Reason for consult: Follow-up assessment Age:51 hours  Follow up visit to 43 hours old LPT infant. Mother states baby just finished feeding ~20 mL of EBM. LC reviewed DEBP use and flange size. Fitted mother with 27 mm for left breast and encouraged to ensure seal at breast when pumping. Talked about using hand pump. Discussed signs of milk coming in to volume. Provided comfort gels for nipple soreness.   Feeding plan:  1-Skin to skin 2-Aim for a deep, comfortable latch 3-Breastfeeding on demand or 8-12 times in 24h period. 4-Keep infant awake during breastfeeding session: massaging breast, infant's hand/shoulder/feet 5-Pump and supplement following guidelines, paced bottle feeding and fullness cues.  6-Use comfort gels  7-Monitor voids and stools as signs good intake.  8-Encouraged maternal rest, hydration and food intake.  9-Contact LC as needed for feeds/support/concerns/questions   All questions answered at this time.    Maternal Data Has patient been taught Hand Expression?: Yes  Feeding Mother's Current Feeding Choice: Breast Milk Nipple Type: Slow - flow  Lactation Tools Discussed/Used Tools: Pump;Flanges Flange Size: 24;27 (24-Right Breast; 27-Left Breast) Breast pump type: Double-Electric Breast Pump;Manual Reason for Pumping: stimulation and supplementation Pumping frequency: Q3 Pumped volume: 22 mL (right breast only. per mother, no volume collected from left breast)  Interventions Interventions: Breast feeding basics reviewed;Education;Expressed milk;Skin to skin;Breast massage;Hand express;DEBP;Hand pump;Comfort gels  Discharge Pump: Personal;Manual;DEBP  Consult Status Consult Status: Follow-up Date: 09/03/20 Follow-up type: In-patient    April Dyer 09/02/2020, 4:35 PM

## 2020-09-02 NOTE — Plan of Care (Signed)
  Problem: Education: Goal: Knowledge of General Education information will improve Description: Including pain rating scale, medication(s)/side effects and non-pharmacologic comfort measures 09/02/2020 0250 by Yancey Flemings, RN Outcome: Adequate for Discharge 09/02/2020 0249 by Yancey Flemings, RN Outcome: Adequate for Discharge 09/02/2020 0248 by Yancey Flemings, RN Outcome: Adequate for Discharge   Problem: Clinical Measurements: Goal: Ability to maintain clinical measurements within normal limits will improve 09/02/2020 0249 by Yancey Flemings, RN Outcome: Adequate for Discharge 09/02/2020 0248 by Yancey Flemings, RN Outcome: Adequate for Discharge Goal: Diagnostic test results will improve Outcome: Adequate for Discharge Goal: Respiratory complications will improve Outcome: Adequate for Discharge Goal: Cardiovascular complication will be avoided Outcome: Adequate for Discharge   Problem: Activity: Goal: Risk for activity intolerance will decrease 09/02/2020 0250 by Yancey Flemings, RN Outcome: Adequate for Discharge 09/02/2020 0248 by Yancey Flemings, RN Outcome: Adequate for Discharge   Problem: Nutrition: Goal: Adequate nutrition will be maintained Outcome: Adequate for Discharge   Problem: Coping: Goal: Level of anxiety will decrease Outcome: Adequate for Discharge   Problem: Elimination: Goal: Will not experience complications related to urinary retention Outcome: Adequate for Discharge   Problem: Pain Managment: Goal: General experience of comfort will improve Outcome: Adequate for Discharge   Problem: Safety: Goal: Ability to remain free from injury will improve Outcome: Adequate for Discharge   Problem: Activity: Goal: Will verbalize the importance of balancing activity with adequate rest periods Outcome: Adequate for Discharge   Problem: Education: Goal: Knowledge of General Education information will improve Description: Including pain  rating scale, medication(s)/side effects and non-pharmacologic comfort measures 09/02/2020 0250 by Yancey Flemings, RN Outcome: Adequate for Discharge 09/02/2020 0249 by Yancey Flemings, RN Outcome: Adequate for Discharge 09/02/2020 0248 by Yancey Flemings, RN Outcome: Adequate for Discharge   Problem: Clinical Measurements: Goal: Ability to maintain clinical measurements within normal limits will improve 09/02/2020 0249 by Yancey Flemings, RN Outcome: Adequate for Discharge 09/02/2020 0248 by Yancey Flemings, RN Outcome: Adequate for Discharge Goal: Diagnostic test results will improve Outcome: Adequate for Discharge Goal: Respiratory complications will improve Outcome: Adequate for Discharge Goal: Cardiovascular complication will be avoided Outcome: Adequate for Discharge   Problem: Activity: Goal: Risk for activity intolerance will decrease 09/02/2020 0250 by Yancey Flemings, RN Outcome: Adequate for Discharge 09/02/2020 0248 by Yancey Flemings, RN Outcome: Adequate for Discharge   Problem: Nutrition: Goal: Adequate nutrition will be maintained Outcome: Adequate for Discharge   Problem: Coping: Goal: Level of anxiety will decrease Outcome: Adequate for Discharge   Problem: Elimination: Goal: Will not experience complications related to urinary retention Outcome: Adequate for Discharge   Problem: Pain Managment: Goal: General experience of comfort will improve Outcome: Adequate for Discharge   Problem: Safety: Goal: Ability to remain free from injury will improve Outcome: Adequate for Discharge   Problem: Activity: Goal: Will verbalize the importance of balancing activity with adequate rest periods Outcome: Adequate for Discharge

## 2020-09-02 NOTE — Plan of Care (Signed)
  Problem: Clinical Measurements: Goal: Diagnostic test results will improve Outcome: Adequate for Discharge Goal: Respiratory complications will improve Outcome: Adequate for Discharge Goal: Cardiovascular complication will be avoided Outcome: Adequate for Discharge   Problem: Activity: Goal: Risk for activity intolerance will decrease 09/02/2020 0250 by Yancey Flemings, RN Outcome: Adequate for Discharge 09/02/2020 0248 by Yancey Flemings, RN Outcome: Adequate for Discharge   Problem: Nutrition: Goal: Adequate nutrition will be maintained Outcome: Adequate for Discharge   Problem: Coping: Goal: Level of anxiety will decrease Outcome: Adequate for Discharge   Problem: Elimination: Goal: Will not experience complications related to urinary retention Outcome: Adequate for Discharge   Problem: Pain Managment: Goal: General experience of comfort will improve Outcome: Adequate for Discharge   Problem: Safety: Goal: Ability to remain free from injury will improve Outcome: Adequate for Discharge   Problem: Activity: Goal: Will verbalize the importance of balancing activity with adequate rest periods Outcome: Adequate for Discharge

## 2020-09-02 NOTE — Plan of Care (Signed)
  Problem: Coping: Goal: Level of anxiety will decrease Outcome: Adequate for Discharge   Problem: Elimination: Goal: Will not experience complications related to urinary retention Outcome: Adequate for Discharge   Problem: Safety: Goal: Ability to remain free from injury will improve Outcome: Adequate for Discharge

## 2020-09-02 NOTE — Plan of Care (Signed)
  Problem: Education: Goal: Knowledge of General Education information will improve Description: Including pain rating scale, medication(s)/side effects and non-pharmacologic comfort measures 09/02/2020 0249 by Yancey Flemings, RN Outcome: Adequate for Discharge 09/02/2020 0248 by Yancey Flemings, RN Outcome: Adequate for Discharge   Problem: Clinical Measurements: Goal: Ability to maintain clinical measurements within normal limits will improve 09/02/2020 0249 by Yancey Flemings, RN Outcome: Adequate for Discharge 09/02/2020 0248 by Yancey Flemings, RN Outcome: Adequate for Discharge Goal: Diagnostic test results will improve Outcome: Adequate for Discharge Goal: Respiratory complications will improve Outcome: Adequate for Discharge Goal: Cardiovascular complication will be avoided Outcome: Adequate for Discharge   Problem: Activity: Goal: Risk for activity intolerance will decrease Outcome: Adequate for Discharge   Problem: Nutrition: Goal: Adequate nutrition will be maintained Outcome: Adequate for Discharge   Problem: Elimination: Goal: Will not experience complications related to urinary retention Outcome: Adequate for Discharge   Problem: Pain Managment: Goal: General experience of comfort will improve Outcome: Adequate for Discharge   Problem: Safety: Goal: Ability to remain free from injury will improve Outcome: Adequate for Discharge   Problem: Activity: Goal: Will verbalize the importance of balancing activity with adequate rest periods Outcome: Adequate for Discharge   Problem: Education: Goal: Knowledge of General Education information will improve Description: Including pain rating scale, medication(s)/side effects and non-pharmacologic comfort measures 09/02/2020 0249 by Yancey Flemings, RN Outcome: Adequate for Discharge 09/02/2020 0248 by Yancey Flemings, RN Outcome: Adequate for Discharge   Problem: Clinical Measurements: Goal: Ability to  maintain clinical measurements within normal limits will improve 09/02/2020 0249 by Yancey Flemings, RN Outcome: Adequate for Discharge 09/02/2020 0248 by Yancey Flemings, RN Outcome: Adequate for Discharge Goal: Diagnostic test results will improve Outcome: Adequate for Discharge Goal: Respiratory complications will improve Outcome: Adequate for Discharge Goal: Cardiovascular complication will be avoided Outcome: Adequate for Discharge   Problem: Activity: Goal: Risk for activity intolerance will decrease Outcome: Adequate for Discharge   Problem: Nutrition: Goal: Adequate nutrition will be maintained Outcome: Adequate for Discharge   Problem: Elimination: Goal: Will not experience complications related to urinary retention Outcome: Adequate for Discharge   Problem: Pain Managment: Goal: General experience of comfort will improve Outcome: Adequate for Discharge   Problem: Safety: Goal: Ability to remain free from injury will improve Outcome: Adequate for Discharge   Problem: Activity: Goal: Will verbalize the importance of balancing activity with adequate rest periods Outcome: Adequate for Discharge   Problem: Education: Goal: Knowledge of General Education information will improve Description: Including pain rating scale, medication(s)/side effects and non-pharmacologic comfort measures 09/02/2020 0249 by Yancey Flemings, RN Outcome: Adequate for Discharge 09/02/2020 0248 by Yancey Flemings, RN Outcome: Adequate for Discharge   Problem: Clinical Measurements: Goal: Ability to maintain clinical measurements within normal limits will improve 09/02/2020 0249 by Yancey Flemings, RN Outcome: Adequate for Discharge 09/02/2020 0248 by Yancey Flemings, RN Outcome: Adequate for Discharge

## 2020-09-02 NOTE — Plan of Care (Signed)
  Problem: Coping: Goal: Level of anxiety will decrease Outcome: Adequate for Discharge   Problem: Elimination: Goal: Will not experience complications related to urinary retention Outcome: Adequate for Discharge

## 2020-09-03 ENCOUNTER — Ambulatory Visit: Payer: Self-pay

## 2020-09-03 MED ORDER — IBUPROFEN 600 MG PO TABS: 600.0000 mg | ORAL_TABLET | Freq: Four times a day (QID) | ORAL | 11 refills | Status: DC | PRN

## 2020-09-03 MED ORDER — OXYCODONE HCL 5 MG PO TABS: 5.0000 mg | ORAL_TABLET | Freq: Four times a day (QID) | ORAL | 0 refills | Status: AC | PRN

## 2020-09-03 MED ORDER — HYDROCHLOROTHIAZIDE 12.5 MG PO CAPS: 12.5000 mg | ORAL_CAPSULE | Freq: Every day | ORAL | 0 refills | Status: DC

## 2020-09-03 NOTE — Progress Notes (Signed)
SVD: primary  S:  Pt reports feeling well. Request early discharge/ Tolerating po/ Voiding without problems/ No n/v/ Bleeding is moderate/ Pain controlled withprescription NSAID's including ibuprofen (Motrin) and narcotic analgesics including oxycodone/acetaminophen (Percocet, Tylox)    O:  A & O x 3 / VS: Blood pressure (!) 156/75, pulse 88, temperature (!) 97.2 F (36.2 C), temperature source Oral, resp. rate 18, height 5' 8.5" (1.74 m), weight 106.5 kg, SpO2 100 %, unknown if currently breastfeeding.  LABS:  CBC Latest Ref Rng & Units 09/01/2020 08/29/2020 08/15/2020  WBC 4.0 - 10.5 K/uL 11.1(H) 12.1(H) 13.1(H)  Hemoglobin 12.0 - 15.0 g/dL 9.3(L) 12.0 11.7(L)  Hematocrit 36.0 - 46.0 % 28.0(L) 35.8(L) 34.8(L)  Platelets 150 - 400 K/uL 189 222 238     I&O: No intake/output data recorded.   No intake/output data recorded.  Lungs: chest clear, no wheezing, rales, normal symmetric air entry  Heart: regular rate and rhythm and S1, S2 normal  Abdomen: soft uterus firm   Perineum: not inspected  Lochia: moderate  Extremities:no redness or tenderness in the calves or thighs, edema 1+    A/P: POD # 2/PPD # 2/ [id number]  S/p Primary C/S Chronic HTN on labetalol Hypothyroidism on synthyroid  Doing well  Continue routine post partum orders  D/c instructions reviewed F/u BP check 1 wk Cont labetalol. Synthroid

## 2020-09-03 NOTE — Discharge Summary (Signed)
Postpartum Discharge Summary  Date of Service updated      Patient Name: April Dyer DOB: 14-Dec-1970 MRN: [id number]  Date of admission: 08/31/2020 Delivery date:08/31/2020  Delivering provider: Tijah Hane  Date of discharge: 09/03/2020  Admitting diagnosis: Pregnancy w/ hx of uterine myomectomy [O34.29] Postpartum care following cesarean delivery [Z39.2] Intrauterine pregnancy: [redacted]w[redacted]d    Secondary diagnosis:  Active Problems:   Pregnancy w/ hx of uterine myomectomy   Postpartum care following cesarean delivery AMA IVF pregnancy Additional problems:  Chronic HTN Hypothyroidism  Class A1 GDM    Discharge diagnosis: Term Pregnancy Delivered, CHTN, GDM A1, and hypothyroidism                                               Post partum procedures: none Augmentation: N/A Complications: None  Hospital course: Sceduled C/S   50y.o. yo [id number] admitted to the hospital 08/31/2020 for scheduled cesarean section with the following indication:Prior Uterine Surgery and Elective Primary.Delivery details are as follows:  Membrane Rupture Time/Date: 12:16 PM ,08/31/2020   Delivery Method:C-Section, Low Transverse  Details of operation can be found in separate operative note.  Patient had an uncomplicated postpartum course.  She is ambulating, tolerating a regular diet, passing flatus, and urinating well. Patient is discharged home in stable condition on  09/03/20        Newborn Data: Birth date:08/31/2020  Birth time:12:18 PM  Gender:Female  Living status:Living  Apgars:9 ,9  Weight:3.005 kg     Magnesium Sulfate received: No BMZ received: No Rhophylac:No MMR:No T-DaP:Given prenatally Flu: No Transfusion:No  Physical exam  Vitals:   09/02/20 1020 09/02/20 1550 09/02/20 2151 09/03/20 0524  BP: (!) 145/74 138/76 (!) 149/80 (!) 156/75  Pulse: 71 79 75 88  Resp:  18 18 18   Temp:  98.8 F (37.1 C) (!) 97.3 F (36.3 C) (!) 97.2 F (36.2 C)  TempSrc:   Oral Oral Oral  SpO2:  100% 100%   Weight:      Height:       General: alert, cooperative, and no distress Lochia: appropriate Uterine Fundus: firm Incision: Dressing is clean, dry, and intact DVT Evaluation: No evidence of DVT seen on physical exam. Calf/Ankle edema is present Labs: Lab Results  Component Value Date   WBC 11.1 (H) 09/01/2020   HGB 9.3 (L) 09/01/2020   HCT 28.0 (L) 09/01/2020   MCV 100.7 (H) 09/01/2020   PLT 189 09/01/2020   CMP Latest Ref Rng & Units 08/15/2020  Glucose 70 - 99 mg/dL 101(H)  BUN 6 - 20 mg/dL 7  Creatinine 0.44 - 1.00 mg/dL 0.75  Sodium 135 - 145 mmol/L 136  Potassium 3.5 - 5.1 mmol/L 3.3(L)  Chloride 98 - 111 mmol/L 108  CO2 22 - 32 mmol/L 20(L)  Calcium 8.9 - 10.3 mg/dL 8.7(L)  Total Protein 6.5 - 8.1 g/dL 5.6(L)  Total Bilirubin 0.3 - 1.2 mg/dL 0.8  Alkaline Phos 38 - 126 U/L 123  AST 15 - 41 U/L 23  ALT 0 - 44 U/L 13   Edinburgh Score: Edinburgh Postnatal Depression Scale Screening Tool 09/01/2020  I have been able to laugh and see the funny side of things. 0  I have looked forward with enjoyment to things. 0  I have blamed myself unnecessarily when things went wrong. 2  I have  been anxious or worried for no good reason. 0  I have felt scared or panicky for no good reason. 3  Things have been getting on top of me. 2  I have been so unhappy that I have had difficulty sleeping. 2  I have felt sad or miserable. 1  I have been so unhappy that I have been crying. 1  The thought of harming myself has occurred to me. 0  Edinburgh Postnatal Depression Scale Total 11      After visit meds:  Allergies as of 09/03/2020   No Known Allergies      Medication List     STOP taking these medications    potassium chloride SA 20 MEQ tablet Commonly known as: KLOR-CON       TAKE these medications    budesonide-formoterol 160-4.5 MCG/ACT inhaler Commonly known as: Symbicort Inhale 2 puffs into the lungs 2 (two) times daily. What  changed:  when to take this reasons to take this   diphenhydramine-acetaminophen 25-500 MG Tabs tablet Commonly known as: TYLENOL PM Take 2 tablets by mouth at bedtime as needed (pain/sleep).   hydrochlorothiazide 12.5 MG capsule Commonly known as: Microzide Take 1 capsule (12.5 mg total) by mouth daily for 5 days.   ibuprofen 600 MG tablet Commonly known as: ADVIL Take 1 tablet (600 mg total) by mouth every 6 (six) hours as needed.   labetalol 200 MG tablet Commonly known as: NORMODYNE Take 400 mg by mouth 2 (two) times daily.   levothyroxine 175 MCG tablet Commonly known as: SYNTHROID Take 175 mcg by mouth daily before breakfast.   oxyCODONE 5 MG immediate release tablet Commonly known as: Oxy IR/ROXICODONE Take 1-2 tablets (5-10 mg total) by mouth every 6 (six) hours as needed for up to 7 days for moderate pain.   pantoprazole 20 MG tablet Commonly known as: PROTONIX Take 2 tablets (40 mg total) by mouth daily.   prenatal multivitamin Tabs tablet Take 1 tablet by mouth daily.   SIMPLY SALINE NA Place 1 spray into the nose 3 (three) times daily as needed (congestion).   Vitamin D (Ergocalciferol) 1.25 MG (50000 UNIT) Caps capsule Commonly known as: DRISDOL Take 50,000 Units by mouth every Thursday.         Discharge home in stable condition Infant Feeding: Breast Infant Disposition:home with mother Discharge instruction: per After Visit Summary and Postpartum booklet. Activity: Advance as tolerated. Pelvic rest for 6 weeks.  Diet: low salt diet Anticipated Birth Control: Unsure Postpartum Appointment:6 weeks Additional Postpartum F/U: BP check 1 week Future Appointments: Future Appointments  Date Time Provider [place]  09/28/2020  2:30 PM Parrett, Fonnie Mu, NP [facility] None   Follow up Visit:      09/03/2020 Marvene Staff, MD

## 2020-09-03 NOTE — Lactation Note (Signed)
This note was copied from a baby's chart. Lactation Consultation Note  Patient Name: April Dyer [id number] Date: 09/03/2020   Age:50 Hours Assist mother with latch infant. Infant sustained latch with good suck swallow pattern . Parents observed swallows and teaching was done. Mothers breast soften.  Infant was offered alternate breast. Mothers breast soften with good suck/ swallow pattern and good massage.  Encouraged mother to continue to use ice for 15 mins q 3-4 hours until engorgement resolves. Suggested that mother follow up with LC and have a pre and post wt assessment.    Mother to continue to cue base feed infant and feed at least 8-12 times or more in 24 hours and advised to allow for cluster feeding infant as needed.   Mother to continue to due STS. Mother is aware of available LC services at [facility], BFSG'S, OP Dept, and phone # for questions or concerns about breastfeeding.  Mother receptive to all teaching and plan of care.     Maternal Data    Feeding    LATCH Score                    Lactation Tools Discussed/Used    Interventions    Discharge    Consult Status      April Dyer 09/03/2020, 5:22 PM

## 2020-09-22 DIAGNOSIS — R69 Illness, unspecified: Secondary | ICD-10-CM | POA: Diagnosis not present

## 2020-09-28 ENCOUNTER — Encounter: Payer: Self-pay | Admitting: Adult Health

## 2020-09-28 ENCOUNTER — Ambulatory Visit (INDEPENDENT_AMBULATORY_CARE_PROVIDER_SITE_OTHER): Payer: 59

## 2020-09-28 ENCOUNTER — Ambulatory Visit (INDEPENDENT_AMBULATORY_CARE_PROVIDER_SITE_OTHER): Payer: 59 | Admitting: Adult Health

## 2020-09-28 ENCOUNTER — Other Ambulatory Visit: Payer: Self-pay

## 2020-09-28 VITALS — BP 136/82 | HR 76 | Temp 98.7°F | Ht 68.5 in | Wt 206.8 lb

## 2020-09-28 DIAGNOSIS — J387 Other diseases of larynx: Secondary | ICD-10-CM

## 2020-09-28 DIAGNOSIS — U099 Post covid-19 condition, unspecified: Secondary | ICD-10-CM | POA: Insufficient documentation

## 2020-09-28 DIAGNOSIS — R0600 Dyspnea, unspecified: Secondary | ICD-10-CM

## 2020-09-28 DIAGNOSIS — J45909 Unspecified asthma, uncomplicated: Secondary | ICD-10-CM | POA: Diagnosis not present

## 2020-09-28 DIAGNOSIS — R0609 Other forms of dyspnea: Secondary | ICD-10-CM

## 2020-09-28 NOTE — Assessment & Plan Note (Signed)
Patient has ongoing multiple symptoms post COVID with fatigue, low energy, low activity tolerance, dyspnea, headaches, dizziness.  Continue with supportive care.  Activity as tolerated. Continue to follow

## 2020-09-28 NOTE — Assessment & Plan Note (Signed)
Chronic dyspnea suspect is multifactorial.  Patient has had extensive work-up with pulmonary function testing showing normal lung function.  Walk test in the office without desaturations. Patient is only 3 weeks postdelivery.  Can consider a cardiopulmonary stress test going forward if indicated.  We will start with chest x-ray..  May need to consider high-resolution CT chest to see if she has any post COVID inflammatory changes./Scarring  Plan  Patient Instructions  Chest xray today .  Claritin 10mg  daily As needed   Activity as tolerated.  Saline nasal rinses and gel As needed .  Continue on Symbicort 1 puffs Twice daily , rinse after use.  Albuterol inhaler 1-2 puffs every 6hrs as needed .  Follow up with Dr. Elsworth Soho or Lovenia Debruler NP in 6 weeks and As needed   Please contact office for sooner follow up if symptoms do not improve or worsen or seek emergency care

## 2020-09-28 NOTE — Patient Instructions (Signed)
Chest xray today .  Claritin 10mg  daily As needed   Activity as tolerated.  Saline nasal rinses and gel As needed .  Continue on Symbicort 1 puffs Twice daily , rinse after use.  Albuterol inhaler 1-2 puffs every 6hrs as needed .  Follow up with Dr. Elsworth Soho or Angalina Ante NP in 6 weeks and As needed   Please contact office for sooner follow up if symptoms do not improve or worsen or seek emergency care
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
## 2020-09-28 NOTE — Assessment & Plan Note (Signed)
Clinical improvement with speech therapy.  Continue with speech therapy follow-ups. Continue with trigger prevention

## 2020-09-29 DIAGNOSIS — G4486 Cervicogenic headache: Secondary | ICD-10-CM | POA: Diagnosis not present

## 2020-10-10 DIAGNOSIS — R69 Illness, unspecified: Secondary | ICD-10-CM | POA: Diagnosis not present

## 2020-10-13 ENCOUNTER — Encounter (HOSPITAL_COMMUNITY): Payer: Self-pay

## 2020-10-13 ENCOUNTER — Ambulatory Visit (HOSPITAL_COMMUNITY)
Admission: EM | Admit: 2020-10-13 | Discharge: 2020-10-13 | Disposition: A | Discharge status: Home / Self Care | Payer: 59 | Attending: Internal Medicine | Admitting: Internal Medicine

## 2020-10-13 ENCOUNTER — Other Ambulatory Visit: Payer: Self-pay

## 2020-10-13 DIAGNOSIS — O165 Unspecified maternal hypertension, complicating the puerperium: Secondary | ICD-10-CM | POA: Diagnosis not present

## 2020-10-13 DIAGNOSIS — I160 Hypertensive urgency: Secondary | ICD-10-CM | POA: Insufficient documentation

## 2020-10-13 LAB — CBC WITH DIFFERENTIAL/PLATELET
Abs Immature Granulocytes: 0.03 10*3/uL (ref 0.00–0.07)
Basophils Absolute: 0 10*3/uL (ref 0.0–0.1)
Basophils Relative: 0 %
Eosinophils Absolute: 0.2 10*3/uL (ref 0.0–0.5)
Eosinophils Relative: 2 %
HCT: 40.1 % (ref 36.0–46.0)
Hemoglobin: 13.4 g/dL (ref 12.0–15.0)
Immature Granulocytes: 0 %
Lymphocytes Relative: 19 %
Lymphs Abs: 1.7 10*3/uL (ref 0.7–4.0)
MCH: 32.7 pg (ref 26.0–34.0)
MCHC: 33.4 g/dL (ref 30.0–36.0)
MCV: 97.8 fL (ref 80.0–100.0)
Monocytes Absolute: 0.4 10*3/uL (ref 0.1–1.0)
Monocytes Relative: 5 %
Neutro Abs: 6.6 10*3/uL (ref 1.7–7.7)
Neutrophils Relative %: 74 %
Platelets: 238 10*3/uL (ref 150–400)
RBC: 4.1 MIL/uL (ref 3.87–5.11)
RDW: 12.8 % (ref 11.5–15.5)
WBC: 8.9 10*3/uL (ref 4.0–10.5)
nRBC: 0 % (ref 0.0–0.2)

## 2020-10-13 LAB — COMPREHENSIVE METABOLIC PANEL
ALT: 23 U/L (ref 0–44)
AST: 26 U/L (ref 15–41)
Albumin: 4.2 g/dL (ref 3.5–5.0)
Alkaline Phosphatase: 110 U/L (ref 38–126)
Anion gap: 7 (ref 5–15)
BUN: 12 mg/dL (ref 6–20)
CO2: 26 mmol/L (ref 22–32)
Calcium: 9.9 mg/dL (ref 8.9–10.3)
Chloride: 106 mmol/L (ref 98–111)
Creatinine, Ser: 1.06 mg/dL — ABNORMAL HIGH (ref 0.44–1.00)
GFR, Estimated: >60 mL/min (ref >60)
Glucose, Bld: 98 mg/dL (ref 70–99)
Potassium: 4.4 mmol/L (ref 3.5–5.1)
Sodium: 139 mmol/L (ref 135–145)
Total Bilirubin: 0.7 mg/dL (ref 0.3–1.2)
Total Protein: 7.3 g/dL (ref 6.5–8.1)

## 2020-10-13 LAB — PROTEIN / CREATININE RATIO, URINE
Creatinine, Urine: 153.9 mg/dL
Protein Creatinine Ratio: 0.07 mg/mg{Cre} (ref 0.00–0.15)
Total Protein, Urine: 11 mg/dL

## 2020-10-13 LAB — LACTATE DEHYDROGENASE: LDH: 156 U/L (ref 98–192)

## 2020-10-13 LAB — URIC ACID: Uric Acid, Serum: 5.9 mg/dL (ref 2.5–7.1)

## 2020-10-13 MED ORDER — LABETALOL HCL 300 MG PO TABS: 600.0000 mg | ORAL_TABLET | Freq: Two times a day (BID) | ORAL | Status: DC

## 2020-10-13 MED ORDER — HYDRALAZINE HCL 25 MG PO TABS: 25.0000 mg | ORAL_TABLET | Freq: Three times a day (TID) | ORAL | 0 refills | Status: DC

## 2020-10-13 NOTE — ED Triage Notes (Signed)
Patient states she was sent here by her PCP d/t BP: 171/110. Pt c/o headache, went to headache center and was given injection that made symptoms worse. Pt states she is seeing floaters.  6 wks postpartum, hx of CVA. No swelling to bilateral lower extremities.  Interventions: labetalol

## 2020-10-13 NOTE — Discharge Instructions (Addendum)
Please take medications as prescribed We will call you with recommendations if labs are abnormal Please go to the emergency department if you have worsening headaches, blurry vision, numbness/tingling/weakness, speech difficulties or confusion.
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
## 2020-10-20 DIAGNOSIS — R69 Illness, unspecified: Secondary | ICD-10-CM | POA: Diagnosis not present

## 2020-10-23 DIAGNOSIS — R69 Illness, unspecified: Secondary | ICD-10-CM | POA: Diagnosis not present

## 2020-11-01 DIAGNOSIS — R69 Illness, unspecified: Secondary | ICD-10-CM | POA: Diagnosis not present

## 2020-11-06 DIAGNOSIS — R69 Illness, unspecified: Secondary | ICD-10-CM | POA: Diagnosis not present

## 2020-11-09 ENCOUNTER — Encounter: Payer: Self-pay | Admitting: Adult Health

## 2020-11-09 ENCOUNTER — Ambulatory Visit (INDEPENDENT_AMBULATORY_CARE_PROVIDER_SITE_OTHER): Payer: 59 | Admitting: Adult Health

## 2020-11-09 ENCOUNTER — Other Ambulatory Visit: Payer: Self-pay

## 2020-11-09 ENCOUNTER — Ambulatory Visit: Payer: 59 | Admitting: Adult Health

## 2020-11-09 DIAGNOSIS — U099 Post covid-19 condition, unspecified: Secondary | ICD-10-CM | POA: Diagnosis not present

## 2020-11-09 DIAGNOSIS — R053 Chronic cough: Secondary | ICD-10-CM | POA: Diagnosis not present

## 2020-11-09 DIAGNOSIS — R0600 Dyspnea, unspecified: Secondary | ICD-10-CM | POA: Diagnosis not present

## 2020-11-09 NOTE — Assessment & Plan Note (Signed)
Long discussion with patient regarding symptom management.  Patient is encouraged on healthy sleep regimen.  Nutrition.  Activity as tolerated. After her evaluation with cardiology would consider a pulmonary rehab referral if patient is interested.  Plan  Patient Instructions  Voice exercises .  Follow up speech therapy  Activity as tolerated.  Follow up Cardiology .  Saline nasal rinses and gel As needed .  Continue Symbicort 1 puffs Twice daily , rinse after use.  Voice rest , sips of water . No MINTS.  Avoid eating 2-3 hrs prior to bedtime.   Albuterol inhaler As needed   Delsym 2 tsp Twice daily  for ocugh  Zyrtec '10mg'$  At bedtime  As needed   Follow up with Dr. Elsworth Soho  in 3 months and As needed   Please contact office for sooner follow up if symptoms do not improve or worsen or seek emergency care
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
## 2020-11-09 NOTE — Assessment & Plan Note (Signed)
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  Continue with speech exercises.  Continue cough control and trigger prevention.  May have a component of reactive airways/intermittent asthma.  Continue on Symbicort for now.  Previous pulmonary function testing showed no airflow obstruction or restriction.  And a normal diffusing capacity.  Her chest x-ray has been clear.  O2 saturations have been normal with no desaturations.  2D echo showed normal EF.  And normal RV size and function.  If continues to have ongoing symptoms consider a high-resolution CT chest to further evaluate  Plan  Patient Instructions  Voice exercises .  Follow up speech therapy  Activity as tolerated.  Follow up Cardiology .  Saline nasal rinses and gel As needed .  Continue Symbicort 1 puffs Twice daily , rinse after use.  Voice rest , sips of water . No MINTS.  Avoid eating 2-3 hrs prior to bedtime.   Albuterol inhaler As needed   Delsym 2 tsp Twice daily  for ocugh  Zyrtec '10mg'$  At bedtime  As needed   Follow up with Dr. Elsworth Soho  in 3 months and As needed   Please contact office for sooner follow up if symptoms do not improve or worsen or seek emergency care

## 2020-11-09 NOTE — Assessment & Plan Note (Signed)
Chronic dyspnea post COVID-19 infection.  Patient's had an extensive work-up that has been unrevealing.  She continues to have low activity tolerance, shortness of breath with activities and ongoing fatigue. Patient is on very high-dose labetalol which can contribute to fatigue and low energy.  Suspect her symptoms are multifactorial with long-haul COVID symptoms, deconditioning, she is also only 2 months postpartum.  She is still recovering from pregnancy and delivery. Have encouraged her to advance activity as tolerated.  Follow-up with cardiology as planned for ongoing evaluation.  Patient is breast-feeding so blood pressure medication choices are limited.  Once she has completed this can look at alternative blood pressures with less side effect profile. If ongoing symptoms could consider a cardiopulmonary stress test also pulmonary rehab for long-haul COVID symptoms  Plan  Patient Instructions  Voice exercises .  Follow up speech therapy  Activity as tolerated.  Follow up Cardiology .  Saline nasal rinses and gel As needed .  Continue Symbicort 1 puffs Twice daily , rinse after use.  Voice rest , sips of water . No MINTS.  Avoid eating 2-3 hrs prior to bedtime.   Albuterol inhaler As needed   Delsym 2 tsp Twice daily  for ocugh  Zyrtec '10mg'$  At bedtime  As needed   Follow up with Dr. Elsworth Soho  in 3 months and As needed   Please contact office for sooner follow up if symptoms do not improve or worsen or seek emergency care

## 2020-11-09 NOTE — Patient Instructions (Addendum)
Voice exercises .  Follow up speech therapy  Activity as tolerated.  Follow up Cardiology .  Saline nasal rinses and gel As needed .  Continue Symbicort 1 puffs Twice daily , rinse after use.  Voice rest , sips of water . No MINTS.  Avoid eating 2-3 hrs prior to bedtime.   Albuterol inhaler As needed   Delsym 2 tsp Twice daily  for ocugh  Zyrtec '10mg'$  At bedtime  As needed   Follow up with Dr. Elsworth Soho  in 3 months and As needed   Please contact office for sooner follow up if symptoms do not improve or worsen or seek emergency care

## 2020-11-10 DIAGNOSIS — R69 Illness, unspecified: Secondary | ICD-10-CM | POA: Diagnosis not present

## 2020-11-14 DIAGNOSIS — R69 Illness, unspecified: Secondary | ICD-10-CM | POA: Diagnosis not present

## 2020-11-20 DIAGNOSIS — R69 Illness, unspecified: Secondary | ICD-10-CM | POA: Diagnosis not present

## 2020-11-23 DIAGNOSIS — R69 Illness, unspecified: Secondary | ICD-10-CM | POA: Diagnosis not present

## 2020-11-28 DIAGNOSIS — R69 Illness, unspecified: Secondary | ICD-10-CM | POA: Diagnosis not present

## 2020-11-30 DIAGNOSIS — R69 Illness, unspecified: Secondary | ICD-10-CM | POA: Diagnosis not present

## 2020-12-07 DIAGNOSIS — R69 Illness, unspecified: Secondary | ICD-10-CM | POA: Diagnosis not present

## 2020-12-12 DIAGNOSIS — G4486 Cervicogenic headache: Secondary | ICD-10-CM | POA: Diagnosis not present

## 2020-12-19 DIAGNOSIS — R69 Illness, unspecified: Secondary | ICD-10-CM | POA: Diagnosis not present

## 2020-12-25 DIAGNOSIS — R69 Illness, unspecified: Secondary | ICD-10-CM | POA: Diagnosis not present

## 2020-12-29 DIAGNOSIS — R69 Illness, unspecified: Secondary | ICD-10-CM | POA: Diagnosis not present

## 2021-01-01 DIAGNOSIS — R69 Illness, unspecified: Secondary | ICD-10-CM | POA: Diagnosis not present

## 2021-01-03 ENCOUNTER — Encounter (HOSPITAL_BASED_OUTPATIENT_CLINIC_OR_DEPARTMENT_OTHER): Payer: Self-pay | Admitting: Cardiovascular Disease

## 2021-01-03 ENCOUNTER — Ambulatory Visit (INDEPENDENT_AMBULATORY_CARE_PROVIDER_SITE_OTHER): Payer: 59 | Admitting: Cardiovascular Disease

## 2021-01-03 ENCOUNTER — Encounter: Payer: Self-pay | Admitting: *Deleted

## 2021-01-03 ENCOUNTER — Other Ambulatory Visit: Payer: Self-pay

## 2021-01-03 VITALS — BP 178/109 | HR 67 | Ht 68.5 in | Wt 225.5 lb

## 2021-01-03 DIAGNOSIS — Z5181 Encounter for therapeutic drug level monitoring: Secondary | ICD-10-CM

## 2021-01-03 DIAGNOSIS — I10 Essential (primary) hypertension: Secondary | ICD-10-CM

## 2021-01-03 DIAGNOSIS — R079 Chest pain, unspecified: Secondary | ICD-10-CM

## 2021-01-03 DIAGNOSIS — I998 Other disorder of circulatory system: Secondary | ICD-10-CM

## 2021-01-03 DIAGNOSIS — R9431 Abnormal electrocardiogram [ECG] [EKG]: Secondary | ICD-10-CM

## 2021-01-03 DIAGNOSIS — Z006 Encounter for examination for normal comparison and control in clinical research program: Secondary | ICD-10-CM

## 2021-01-03 MED ORDER — HYDROCHLOROTHIAZIDE 25 MG PO TABS: 25.0000 mg | ORAL_TABLET | Freq: Every day | ORAL | 3 refills | Status: DC

## 2021-01-03 MED ORDER — AMLODIPINE BESYLATE 5 MG PO TABS: 5.0000 mg | ORAL_TABLET | Freq: Every day | ORAL | 3 refills | Status: DC

## 2021-01-03 NOTE — Patient Instructions (Signed)
Medication Instructions:  START AMLODIPINE 5 MG DAILY   START HYDROCHLOROTHIAZIDE 25 MG DAILY    Labwork: FASTING LP/CMET IN 1 WEEK    Testing/Procedures: Your physician has requested that you have cardiac CT. Cardiac computed tomography (CT) is a painless test that uses an x-ray machine to take clear, detailed pictures of your heart. For further information please visit [email or web address]. Please follow instruction sheet as given. THE OFFICE WILL CALL YOU TO SCHEDULE ONCE INSURANCE HAS BEEN REVIEWED  IF YOU DO NOT HEAR IN 2 WEEKS CALL THE OFFICE TO FOLLOW UP   Your physician has requested that you have a carotid duplex. This test is an ultrasound of the carotid arteries in your neck. It looks at blood flow through these arteries that supply the brain with blood. Allow one hour for this exam. There are no restrictions or special instructions.   Follow-Up: 02/06/2021 2:00 PM WITH PHARM D AT [facility] OFFICE    Special Instructions:   MONITOR YOUR BLOOD PRESSURE TWICE A DAY, LOG IN THE BOOK PROVIDED. BRING THE BOOK AND YOUR BLOOD PRESSURE MACHINE TO YOUR FOLLOW UP IN 1 MONTH     Your cardiac CT will be scheduled at one of the below locations:   [facility] [address] [place], [place] [address] [phone]  [place] [address] [place], [place] [address] [phone]  If scheduled at [facility], please arrive at the [facility] main entrance (entrance A) of [facility] 30 minutes prior to test start time. Proceed to the [facility] Radiology Department (first floor) to check-in and test prep.  If scheduled at [facility], please arrive 15 mins early for check-in and test prep.  Please follow these instructions carefully (unless otherwise directed):  Hold all erectile dysfunction medications at least 3 days (72 hrs) prior to test.  On the Night Before the  Test: Be sure to Drink plenty of water. Do not consume any caffeinated/decaffeinated beverages or chocolate 12 hours prior to your test. Do not take any antihistamines 12 hours prior to your test. If the patient has contrast allergy: Patient will need a prescription for Prednisone and very clear instructions (as follows): Prednisone 50 mg - take 13 hours prior to test Take another Prednisone 50 mg 7 hours prior to test Take another Prednisone 50 mg 1 hour prior to test Take Benadryl 50 mg 1 hour prior to test Patient must complete all four doses of above prophylactic medications. Patient will need a ride after test due to Benadryl.  On the Day of the Test: Drink plenty of water until 1 hour prior to the test. Do not eat any food 4 hours prior to the test. You may take your regular medications prior to the test.  Take metoprolol (Lopressor) two hours prior to test. HOLD Furosemide/Hydrochlorothiazide morning of the test. FEMALES- please wear underwire-free bra if available, avoid dresses & tight clothing      After the Test: Drink plenty of water. After receiving IV contrast, you may experience a mild flushed feeling. This is normal. On occasion, you may experience a mild rash up to 24 hours after the test. This is not dangerous. If this occurs, you can take Benadryl 25 mg and increase your fluid intake. If you experience trouble breathing, this can be serious. If it is severe call 911 IMMEDIATELY. If it is mild, please call our office. If you take any of these medications:  Glipizide/Metformin, Avandament, Glucavance, please do not take 48 hours after completing test unless otherwise instructed.  Please allow 2-4 weeks for scheduling of routine cardiac CTs. Some insurance companies require a pre-authorization which may delay scheduling of this test.   For non-scheduling related questions, please contact the cardiac imaging nurse navigator should you have any questions/concerns: [name], Cardiac Imaging Nurse Navigator Gordy Clement, Cardiac Imaging Nurse Navigator [facility] Direct Office Dial: [phone]   For scheduling needs, including cancellations and rescheduling, please call [place], [phone].  Cardiac CT Angiogram A cardiac CT angiogram is a procedure to look at the heart and the area around the heart. It may be done to help find the cause of chest pains or other symptoms of heart disease. During this procedure, a substance called contrast dye is injected into the blood vessels in the area to be checked. A large X-ray machine, called a CT scanner, then takes detailed pictures of the heart and the surrounding area. The procedure is also sometimes called a coronary CT angiogram, coronary artery scanning, or CTA. A cardiac CT angiogram allows the health care provider to see how well blood is flowing to and from the heart. The health care provider will be able to see if there are any problems, such as: Blockage or narrowing of the coronary arteries in the heart. Fluid around the heart. Signs of weakness or disease in the muscles, valves, and tissues of the heart. Tell a health care provider about: Any allergies you have. This is especially important if you have had a previous allergic reaction to contrast dye. All medicines you are taking, including vitamins, herbs, eye drops, creams, and over-the-counter medicines. Any blood disorders you have. Any surgeries you have had. Any medical conditions you have. Whether you are pregnant or may be pregnant. Any anxiety disorders, chronic pain, or other conditions you have that may increase your stress or prevent you from lying still. What are the risks? Generally, this is a safe procedure. However, problems may occur, including: Bleeding. Infection. Allergic reactions to medicines or dyes. Damage to other structures or organs. Kidney damage from the contrast dye that is  used. Increased risk of cancer from radiation exposure. This risk is low. Talk with your health care provider about: The risks and benefits of testing. How you can receive the lowest dose of radiation. What happens before the procedure? Wear comfortable clothing and remove any jewelry, glasses, dentures, and hearing aids. Follow instructions from your health care provider about eating and drinking. This may include: For 12 hours before the procedure -- avoid caffeine. This includes tea, coffee, soda, energy drinks, and diet pills. Drink plenty of water or other fluids that do not have caffeine in them. Being well hydrated can prevent complications. For 4-6 hours before the procedure -- stop eating and drinking. The contrast dye can cause nausea, but this is less likely if your stomach is empty. Ask your health care provider about changing or stopping your regular medicines. This is especially important if you are taking diabetes medicines, blood thinners, or medicines to treat problems with erections (erectile dysfunction). What happens during the procedure?  Hair on your chest may need to be removed so that small sticky patches called electrodes can be placed on your chest. These will transmit information that helps to monitor your heart during the procedure. An IV will be inserted into one of your veins. You might be given a medicine to control your heart rate during  the procedure. This will help to ensure that good images are obtained. You will be asked to lie on an exam table. This table will slide in and out of the CT machine during the procedure. Contrast dye will be injected into the IV. You might feel warm, or you may get a metallic taste in your mouth. You will be given a medicine called nitroglycerin. This will relax or dilate the arteries in your heart. The table that you are lying on will move into the CT machine tunnel for the scan. The person running the machine will give you  instructions while the scans are being done. You may be asked to: Keep your arms above your head. Hold your breath. Stay very still, even if the table is moving. When the scanning is complete, you will be moved out of the machine. The IV will be removed. The procedure may vary among health care providers and hospitals. What can I expect after the procedure? After your procedure, it is common to have: A metallic taste in your mouth from the contrast dye. A feeling of warmth. A headache from the nitroglycerin. Follow these instructions at home: Take over-the-counter and prescription medicines only as told by your health care provider. If you are told, drink enough fluid to keep your urine pale yellow. This will help to flush the contrast dye out of your body. Most people can return to their normal activities right after the procedure. Ask your health care provider what activities are safe for you. It is up to you to get the results of your procedure. Ask your health care provider, or the department that is doing the procedure, when your results will be ready. Keep all follow-up visits as told by your health care provider. This is important. Contact a health care provider if: You have any symptoms of allergy to the contrast dye. These include: Shortness of breath. Rash or hives. A racing heartbeat. Summary A cardiac CT angiogram is a procedure to look at the heart and the area around the heart. It may be done to help find the cause of chest pains or other symptoms of heart disease. During this procedure, a large X-ray machine, called a CT scanner, takes detailed pictures of the heart and the surrounding area after a contrast dye has been injected into blood vessels in the area. Ask your health care provider about changing or stopping your regular medicines before the procedure. This is especially important if you are taking diabetes medicines, blood thinners, or medicines to treat erectile  dysfunction. If you are told, drink enough fluid to keep your urine pale yellow. This will help to flush the contrast dye out of your body. This information is not intended to replace advice given to you by your health care provider. Make sure you discuss any questions you have with your health care provider. Document Revised: 11/04/2018 Document Reviewed: 11/04/2018 Elsevier Patient Education  [employer].

## 2021-01-03 NOTE — Research (Signed)
Ms . April Dyer met criteria for the Virtual care and social determinant interventions for the management of hypertension trial. The study was discussed with her by Dr. Oval Linsey and myself including risk and benefits. She had the opportunity to read the consent and ask questions. The consent was signed and she was given a copy. No study related procedures were performed prior to consent being signed.  Ms. April Dyer was randomized to Group 1: [facility]
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
## 2021-01-05 ENCOUNTER — Ambulatory Visit (HOSPITAL_COMMUNITY)
Admission: RE | Admit: 2021-01-05 | Payer: 59 | Source: Ambulatory Visit | Attending: Cardiovascular Disease | Admitting: Cardiovascular Disease

## 2021-01-05 DIAGNOSIS — R69 Illness, unspecified: Secondary | ICD-10-CM | POA: Diagnosis not present

## 2021-01-08 DIAGNOSIS — R69 Illness, unspecified: Secondary | ICD-10-CM | POA: Diagnosis not present

## 2021-01-10 DIAGNOSIS — M9901 Segmental and somatic dysfunction of cervical region: Secondary | ICD-10-CM | POA: Diagnosis not present

## 2021-01-10 DIAGNOSIS — M9903 Segmental and somatic dysfunction of lumbar region: Secondary | ICD-10-CM | POA: Diagnosis not present

## 2021-01-10 DIAGNOSIS — M546 Pain in thoracic spine: Secondary | ICD-10-CM | POA: Diagnosis not present

## 2021-01-10 DIAGNOSIS — M9902 Segmental and somatic dysfunction of thoracic region: Secondary | ICD-10-CM | POA: Diagnosis not present

## 2021-01-10 DIAGNOSIS — M5459 Other low back pain: Secondary | ICD-10-CM | POA: Diagnosis not present

## 2021-01-10 DIAGNOSIS — M25511 Pain in right shoulder: Secondary | ICD-10-CM | POA: Diagnosis not present

## 2021-01-10 DIAGNOSIS — M542 Cervicalgia: Secondary | ICD-10-CM | POA: Diagnosis not present

## 2021-01-11 ENCOUNTER — Telehealth (HOSPITAL_COMMUNITY): Payer: Self-pay | Admitting: Emergency Medicine

## 2021-01-11 NOTE — Telephone Encounter (Signed)
Reaching out to patient to offer assistance regarding upcoming cardiac imaging study; pt verbalizes understanding of appt date/time, parking situation and where to check in, pre-test NPO status and medications ordered, and verified current allergies; name and call back number provided for further questions should they arise April Bond RN Navigator Cardiac [facility] [place] [facility] [phone] office [phone] cell  Patient breastfeeding and not required to pump/dump per [facility] radiology protocols. Can be difficult IV stick Daily meds , hold HCTZ

## 2021-01-12 ENCOUNTER — Ambulatory Visit (HOSPITAL_COMMUNITY): Payer: 59

## 2021-01-12 DIAGNOSIS — R69 Illness, unspecified: Secondary | ICD-10-CM | POA: Diagnosis not present

## 2021-01-15 ENCOUNTER — Ambulatory Visit (HOSPITAL_COMMUNITY): Admission: RE | Admit: 2021-01-15 | Payer: 59 | Source: Ambulatory Visit

## 2021-01-15 DIAGNOSIS — M9903 Segmental and somatic dysfunction of lumbar region: Secondary | ICD-10-CM | POA: Diagnosis not present

## 2021-01-15 DIAGNOSIS — M9902 Segmental and somatic dysfunction of thoracic region: Secondary | ICD-10-CM | POA: Diagnosis not present

## 2021-01-15 DIAGNOSIS — M9907 Segmental and somatic dysfunction of upper extremity: Secondary | ICD-10-CM | POA: Diagnosis not present

## 2021-01-15 DIAGNOSIS — R69 Illness, unspecified: Secondary | ICD-10-CM | POA: Diagnosis not present

## 2021-01-15 DIAGNOSIS — M9901 Segmental and somatic dysfunction of cervical region: Secondary | ICD-10-CM | POA: Diagnosis not present

## 2021-01-19 ENCOUNTER — Telehealth (HOSPITAL_COMMUNITY): Payer: Self-pay | Admitting: Emergency Medicine

## 2021-01-19 NOTE — Telephone Encounter (Signed)
Reaching out to patient to offer assistance regarding upcoming cardiac imaging study; pt verbalizes understanding of appt date/time, parking situation and where to check in, pre-test NPO status and medications ordered, and verified current allergies; name and call back number provided for further questions should they arise [name] RN [place] and [facility] [phone] office [phone] cell  Pt breastfeeding but okay to continue without interruption per [facility] radiology protocols.  Pt verbalized understanding

## 2021-01-22 ENCOUNTER — Other Ambulatory Visit: Payer: Self-pay

## 2021-01-22 ENCOUNTER — Ambulatory Visit (HOSPITAL_COMMUNITY)
Admission: RE | Admit: 2021-01-22 | Discharge: 2021-01-22 | Disposition: A | Discharge status: Home / Self Care | Payer: 59 | Source: Ambulatory Visit | Attending: Cardiology | Admitting: Cardiology

## 2021-01-22 ENCOUNTER — Telehealth (HOSPITAL_COMMUNITY): Payer: Self-pay | Admitting: Emergency Medicine

## 2021-01-22 DIAGNOSIS — I998 Other disorder of circulatory system: Secondary | ICD-10-CM | POA: Diagnosis not present

## 2021-01-22 DIAGNOSIS — R69 Illness, unspecified: Secondary | ICD-10-CM | POA: Diagnosis not present

## 2021-01-22 DIAGNOSIS — I10 Essential (primary) hypertension: Secondary | ICD-10-CM | POA: Diagnosis not present

## 2021-01-22 DIAGNOSIS — Z5181 Encounter for therapeutic drug level monitoring: Secondary | ICD-10-CM | POA: Diagnosis not present

## 2021-01-22 NOTE — Telephone Encounter (Signed)
error 

## 2021-01-23 ENCOUNTER — Ambulatory Visit (HOSPITAL_COMMUNITY)
Admission: RE | Admit: 2021-01-23 | Discharge: 2021-01-23 | Disposition: A | Discharge status: Home / Self Care | Payer: 59 | Source: Ambulatory Visit | Attending: Cardiovascular Disease | Admitting: Cardiovascular Disease

## 2021-01-23 DIAGNOSIS — I10 Essential (primary) hypertension: Secondary | ICD-10-CM | POA: Diagnosis not present

## 2021-01-23 DIAGNOSIS — R079 Chest pain, unspecified: Secondary | ICD-10-CM | POA: Insufficient documentation

## 2021-01-23 LAB — LIPID PANEL
Chol/HDL Ratio: 2.4 ratio (ref 0.0–4.4)
Cholesterol, Total: 188 mg/dL (ref 100–199)
HDL: 79 mg/dL (ref >39)
LDL Chol Calc (NIH): 97 mg/dL (ref 0–99)
Triglycerides: 65 mg/dL (ref 0–149)
VLDL Cholesterol Cal: 12 mg/dL (ref 5–40)

## 2021-01-23 LAB — COMPREHENSIVE METABOLIC PANEL
ALT: 15 IU/L (ref 0–32)
AST: 19 IU/L (ref 0–40)
Albumin/Globulin Ratio: 1.7 (ref 1.2–2.2)
Albumin: 4.6 g/dL (ref 3.8–4.9)
Alkaline Phosphatase: 104 IU/L (ref 44–121)
BUN/Creatinine Ratio: 18 (ref 9–23)
BUN: 17 mg/dL (ref 6–24)
Bilirubin Total: 0.3 mg/dL (ref 0.0–1.2)
CO2: 24 mmol/L (ref 20–29)
Calcium: 9.4 mg/dL (ref 8.7–10.2)
Chloride: 106 mmol/L (ref 96–106)
Creatinine, Ser: 0.97 mg/dL (ref 0.57–1.00)
Globulin, Total: 2.7 g/dL (ref 1.5–4.5)
Glucose: 81 mg/dL (ref 70–99)
Potassium: 4 mmol/L (ref 3.5–5.2)
Sodium: 143 mmol/L (ref 134–144)
Total Protein: 7.3 g/dL (ref 6.0–8.5)
eGFR: 71 mL/min/{1.73_m2} (ref >59)

## 2021-01-23 MED ORDER — NITROGLYCERIN 0.4 MG SL SUBL
SUBLINGUAL_TABLET | SUBLINGUAL | Status: AC
  Administered 2021-01-23: 0.8 mg via SUBLINGUAL
  Filled 2021-01-23: qty 2

## 2021-01-23 MED ORDER — METOPROLOL TARTRATE 5 MG/5ML IV SOLN
INTRAVENOUS | Status: AC
  Filled 2021-01-23: qty 20

## 2021-01-23 MED ORDER — IOHEXOL 350 MG/ML SOLN
95.0000 mL | Freq: Once | INTRAVENOUS | Status: AC | PRN
  Administered 2021-01-23: 95 mL via INTRAVENOUS

## 2021-01-23 MED ORDER — METOPROLOL TARTRATE 5 MG/5ML IV SOLN
10.0000 mg | INTRAVENOUS | Status: DC | PRN
  Administered 2021-01-23: 10 mg via INTRAVENOUS

## 2021-01-23 MED ORDER — NITROGLYCERIN 0.4 MG SL SUBL: 0.8000 mg | SUBLINGUAL_TABLET | Freq: Once | SUBLINGUAL | Status: AC

## 2021-01-26 DIAGNOSIS — R69 Illness, unspecified: Secondary | ICD-10-CM | POA: Diagnosis not present

## 2021-01-29 DIAGNOSIS — R69 Illness, unspecified: Secondary | ICD-10-CM | POA: Diagnosis not present

## 2021-02-05 DIAGNOSIS — R69 Illness, unspecified: Secondary | ICD-10-CM | POA: Diagnosis not present

## 2021-02-06 ENCOUNTER — Ambulatory Visit (INDEPENDENT_AMBULATORY_CARE_PROVIDER_SITE_OTHER): Payer: 59 | Admitting: Pharmacist Clinician (PhC)/ Clinical Pharmacy Specialist

## 2021-02-06 ENCOUNTER — Other Ambulatory Visit: Payer: Self-pay

## 2021-02-06 ENCOUNTER — Encounter: Payer: Self-pay | Admitting: Pharmacist Clinician (PhC)/ Clinical Pharmacy Specialist

## 2021-02-06 DIAGNOSIS — I10 Essential (primary) hypertension: Secondary | ICD-10-CM | POA: Diagnosis not present

## 2021-02-06 MED ORDER — AMLODIPINE BESYLATE 10 MG PO TABS: 10.0000 mg | ORAL_TABLET | Freq: Every day | ORAL | 3 refills | Status: DC

## 2021-02-06 NOTE — Assessment & Plan Note (Addendum)
Patient's blood pressure is uncontrolled both at home and in office.Today we will increase the amlodipine from 5 mg to 10 mg. The patient and I discussed ways she could implement physical activity into her daily lifestyle in a way that won't cause her to get shortness of breath. We also reviewed the proper way to take blood pressure at home and encouraged her to bring in her readings next visit. She will follow up in 1 month with PharmD.

## 2021-02-06 NOTE — Patient Instructions (Signed)
If you have any problems with the increased dose of amlodipine, please reach out to me (Burnice Oestreicher at [phone])  Check your blood pressure at home daily and keep record of the readings.  Take your BP meds as follows:  Increase amlodipine to 10 mg once daily - take 2 of the 5 mg tablets daily until gone  Bring all of your meds, your BP cuff and your record of home blood pressures to your next appointment.  Exercise as you're able, try to walk approximately 30 minutes per day.  Keep salt intake to a minimum, especially watch canned and prepared boxed foods.  Eat more fresh fruits and vegetables and fewer canned items.  Avoid eating in fast food restaurants.    HOW TO TAKE YOUR BLOOD PRESSURE: Rest 5 minutes before taking your blood pressure.  Don't smoke or drink caffeinated beverages for at least 30 minutes before. Take your blood pressure before (not after) you eat. Sit comfortably with your back supported and both feet on the floor (don't cross your legs). Elevate your arm to heart level on a table or a desk. Use the proper sized cuff. It should fit smoothly and snugly around your bare upper arm. There should be enough room to slip a fingertip under the cuff. The bottom edge of the cuff should be 1 inch above the crease of the elbow. Ideally, take 3 measurements at one sitting and record the average.
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
## 2021-02-09 DIAGNOSIS — R69 Illness, unspecified: Secondary | ICD-10-CM | POA: Diagnosis not present

## 2021-02-12 DIAGNOSIS — R69 Illness, unspecified: Secondary | ICD-10-CM | POA: Diagnosis not present

## 2021-02-13 ENCOUNTER — Telehealth: Payer: Self-pay | Admitting: *Deleted

## 2021-02-13 DIAGNOSIS — E785 Hyperlipidemia, unspecified: Secondary | ICD-10-CM

## 2021-02-13 DIAGNOSIS — Z5181 Encounter for therapeutic drug level monitoring: Secondary | ICD-10-CM

## 2021-02-13 DIAGNOSIS — I10 Essential (primary) hypertension: Secondary | ICD-10-CM

## 2021-02-13 MED ORDER — ROSUVASTATIN CALCIUM 10 MG PO TABS: 10.0000 mg | ORAL_TABLET | Freq: Every day | ORAL | 3 refills | Status: DC

## 2021-02-13 NOTE — Telephone Encounter (Signed)
Tried to call patient, unable to leave voicemail   Released in my chart with Dr Blenda Mounts comments attached and to call if any questions

## 2021-02-13 NOTE — Telephone Encounter (Signed)
-----   Message from Skeet Latch, MD sent at 02/13/2021  5:31 AM EST ----- Kidney function improved.  Normal liver function and electrolytes.  Given history of stroke she should have an LDL <70.  Her cholesterol is not very elevated but needs tighter control.  Recommend rosuvastatin 10mg .  Repeat lipids/CMP in 2-3 months.

## 2021-02-19 DIAGNOSIS — R69 Illness, unspecified: Secondary | ICD-10-CM | POA: Diagnosis not present

## 2021-02-23 DIAGNOSIS — R69 Illness, unspecified: Secondary | ICD-10-CM | POA: Diagnosis not present

## 2021-02-26 DIAGNOSIS — R69 Illness, unspecified: Secondary | ICD-10-CM | POA: Diagnosis not present

## 2021-03-06 ENCOUNTER — Ambulatory Visit: Payer: 59

## 2021-03-12 DIAGNOSIS — R69 Illness, unspecified: Secondary | ICD-10-CM | POA: Diagnosis not present

## 2021-03-23 ENCOUNTER — Encounter: Payer: Self-pay | Admitting: Pharmacist Clinician (PhC)/ Clinical Pharmacy Specialist

## 2021-03-23 ENCOUNTER — Other Ambulatory Visit: Payer: Self-pay

## 2021-03-23 ENCOUNTER — Ambulatory Visit (INDEPENDENT_AMBULATORY_CARE_PROVIDER_SITE_OTHER): Payer: 59 | Admitting: Pharmacist Clinician (PhC)/ Clinical Pharmacy Specialist

## 2021-03-23 DIAGNOSIS — I10 Essential (primary) hypertension: Secondary | ICD-10-CM

## 2021-03-23 MED ORDER — HYDRALAZINE HCL 50 MG PO TABS: 50.0000 mg | ORAL_TABLET | Freq: Two times a day (BID) | ORAL | 3 refills | Status: DC
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
## 2021-03-23 NOTE — Assessment & Plan Note (Addendum)
Patient with resistant hypertension, still not at BP goals.  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  She would benefit from ACEI/ARB, but between nursing and hoping to become pregnant in the new year, we will avoid these.  Will have her add hydralazine 50 mg twice daily.  She will continue with home monitoring, and I have asked her to call in 10-14 days if no improvement in her readings.  She is scheduled for her third Vivify trial visit on January 17, but we may need to increase medications before then if no improvement.

## 2021-03-23 NOTE — Patient Instructions (Signed)
Return for a a follow up appointment January 17  Check your blood pressure at home daily and keep record of the readings.  Take your BP meds as follows:  Start hydralazine 50 mg twice daily   Continue with all other medications   Bring all of your meds, your BP cuff and your record of home blood pressures to your next appointment.  Exercise as youre able, try to walk approximately 30 minutes per day.  Keep salt intake to a minimum, especially watch canned and prepared boxed foods.  Eat more fresh fruits and vegetables and fewer canned items.  Avoid eating in fast food restaurants.    HOW TO TAKE YOUR BLOOD PRESSURE: Rest 5 minutes before taking your blood pressure.  Dont smoke or drink caffeinated beverages for at least 30 minutes before. Take your blood pressure before (not after) you eat. Sit comfortably with your back supported and both feet on the floor (dont cross your legs). Elevate your arm to heart level on a table or a desk. Use the proper sized cuff. It should fit smoothly and snugly around your bare upper arm. There should be enough room to slip a fingertip under the cuff. The bottom edge of the cuff should be 1 inch above the crease of the elbow. Ideally, take 3 measurements at one sitting and record the average.
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
## 2021-03-26 ENCOUNTER — Ambulatory Visit
Admission: EM | Admit: 2021-03-26 | Discharge: 2021-03-26 | Disposition: A | Discharge status: Home / Self Care | Payer: 59

## 2021-03-30 DIAGNOSIS — R69 Illness, unspecified: Secondary | ICD-10-CM | POA: Diagnosis not present

## 2021-04-05 ENCOUNTER — Encounter: Payer: Self-pay | Admitting: Pharmacist Clinician (PhC)/ Clinical Pharmacy Specialist

## 2021-04-10 ENCOUNTER — Ambulatory Visit (INDEPENDENT_AMBULATORY_CARE_PROVIDER_SITE_OTHER): Payer: 59 | Admitting: Pharmacist Clinician (PhC)/ Clinical Pharmacy Specialist

## 2021-04-10 ENCOUNTER — Other Ambulatory Visit: Payer: Self-pay

## 2021-04-10 DIAGNOSIS — I10 Essential (primary) hypertension: Secondary | ICD-10-CM

## 2021-04-10 NOTE — Patient Instructions (Signed)
Return for a a follow up appointment with Dr. Oval Linsey at the [facility] office on February 21 at 3:45 pm  Check your blood pressure at home daily and keep record of the readings.  Take your BP meds as follows:  Switch amlodipine to 5 mg (1/2 tablet) twice daily  Continue all other medications  Bring all of your meds, your BP cuff and your record of home blood pressures to your next appointment.  Exercise as youre able, try to walk approximately 30 minutes per day.  Keep salt intake to a minimum, especially watch canned and prepared boxed foods.  Eat more fresh fruits and vegetables and fewer canned items.  Avoid eating in fast food restaurants.    HOW TO TAKE YOUR BLOOD PRESSURE: Rest 5 minutes before taking your blood pressure.  Dont smoke or drink caffeinated beverages for at least 30 minutes before. Take your blood pressure before (not after) you eat. Sit comfortably with your back supported and both feet on the floor (dont cross your legs). Elevate your arm to heart level on a table or a desk. Use the proper sized cuff. It should fit smoothly and snugly around your bare upper arm. There should be enough room to slip a fingertip under the cuff. The bottom edge of the cuff should be 1 inch above the crease of the elbow. Ideally, take 3 measurements at one sitting and record the average.

## 2021-04-11 ENCOUNTER — Encounter: Payer: Self-pay | Admitting: Pharmacist Clinician (PhC)/ Clinical Pharmacy Specialist
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
## 2021-04-11 NOTE — Assessment & Plan Note (Addendum)
Patients blood pressure in-clinic and her morning at-home blood pressures were still elevated today. We will split her amlodipine dose to a  tablet (5mg ) in the morning and a  tablet (5mg ) in the evenings to try to get better 24-hour coverage and lower the morning blood pressure readings. We reminded her to keep trying to increase physical activity, that it is OK to push yourself but take breaks as needed. She will follow up with Dr. Oval Linsey on February 21st @ 3:45. We advised her to bring her Vivify blood pressure monitor with her, as the next appointment will be the end of the research study.  Patient currently on rosuvastatin 10 mg daily. LDL was 97 in 12/2020. If she is pursuing IVF pregnancy again, will have to d/c crestor during pregnancy or switch to a different medication.

## 2021-04-13 DIAGNOSIS — R69 Illness, unspecified: Secondary | ICD-10-CM | POA: Diagnosis not present

## 2021-04-18 DIAGNOSIS — N979 Female infertility, unspecified: Secondary | ICD-10-CM | POA: Diagnosis not present

## 2021-04-20 DIAGNOSIS — R69 Illness, unspecified: Secondary | ICD-10-CM | POA: Diagnosis not present

## 2021-04-23 DIAGNOSIS — I119 Hypertensive heart disease without heart failure: Secondary | ICD-10-CM | POA: Diagnosis not present

## 2021-04-23 DIAGNOSIS — R0602 Shortness of breath: Secondary | ICD-10-CM | POA: Diagnosis not present

## 2021-04-23 DIAGNOSIS — G43909 Migraine, unspecified, not intractable, without status migrainosus: Secondary | ICD-10-CM | POA: Diagnosis not present

## 2021-04-23 DIAGNOSIS — K219 Gastro-esophageal reflux disease without esophagitis: Secondary | ICD-10-CM | POA: Diagnosis not present

## 2021-04-23 DIAGNOSIS — N289 Disorder of kidney and ureter, unspecified: Secondary | ICD-10-CM | POA: Diagnosis not present

## 2021-04-23 DIAGNOSIS — I10 Essential (primary) hypertension: Secondary | ICD-10-CM | POA: Diagnosis not present

## 2021-04-23 DIAGNOSIS — R69 Illness, unspecified: Secondary | ICD-10-CM | POA: Diagnosis not present

## 2021-04-23 DIAGNOSIS — E039 Hypothyroidism, unspecified: Secondary | ICD-10-CM | POA: Diagnosis not present

## 2021-04-23 DIAGNOSIS — Z0000 Encounter for general adult medical examination without abnormal findings: Secondary | ICD-10-CM | POA: Diagnosis not present

## 2021-04-27 DIAGNOSIS — Z0189 Encounter for other specified special examinations: Secondary | ICD-10-CM | POA: Diagnosis not present

## 2021-05-01 DIAGNOSIS — R630 Anorexia: Secondary | ICD-10-CM | POA: Insufficient documentation

## 2021-05-01 DIAGNOSIS — R209 Unspecified disturbances of skin sensation: Secondary | ICD-10-CM | POA: Insufficient documentation

## 2021-05-01 DIAGNOSIS — N3941 Urge incontinence: Secondary | ICD-10-CM | POA: Insufficient documentation

## 2021-05-01 DIAGNOSIS — E559 Vitamin D deficiency, unspecified: Secondary | ICD-10-CM | POA: Insufficient documentation

## 2021-05-01 DIAGNOSIS — I119 Hypertensive heart disease without heart failure: Secondary | ICD-10-CM | POA: Insufficient documentation

## 2021-05-02 ENCOUNTER — Encounter: Payer: Self-pay | Admitting: Pharmacist Clinician (PhC)/ Clinical Pharmacy Specialist

## 2021-05-02 DIAGNOSIS — R69 Illness, unspecified: Secondary | ICD-10-CM | POA: Diagnosis not present

## 2021-05-04 DIAGNOSIS — R69 Illness, unspecified: Secondary | ICD-10-CM | POA: Diagnosis not present

## 2021-05-08 DIAGNOSIS — Z1231 Encounter for screening mammogram for malignant neoplasm of breast: Secondary | ICD-10-CM | POA: Diagnosis not present

## 2021-05-11 DIAGNOSIS — R69 Illness, unspecified: Secondary | ICD-10-CM | POA: Diagnosis not present
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
## 2021-05-15 ENCOUNTER — Other Ambulatory Visit: Payer: Self-pay

## 2021-05-15 ENCOUNTER — Ambulatory Visit (INDEPENDENT_AMBULATORY_CARE_PROVIDER_SITE_OTHER): Payer: 59 | Admitting: Cardiovascular Disease

## 2021-05-15 ENCOUNTER — Encounter (HOSPITAL_BASED_OUTPATIENT_CLINIC_OR_DEPARTMENT_OTHER): Payer: Self-pay | Admitting: Cardiovascular Disease

## 2021-05-15 VITALS — BP 129/83 | HR 80 | Ht 68.5 in | Wt 224.2 lb

## 2021-05-15 DIAGNOSIS — I10 Essential (primary) hypertension: Secondary | ICD-10-CM | POA: Diagnosis not present

## 2021-05-15 DIAGNOSIS — Z006 Encounter for examination for normal comparison and control in clinical research program: Secondary | ICD-10-CM

## 2021-05-15 DIAGNOSIS — G4733 Obstructive sleep apnea (adult) (pediatric): Secondary | ICD-10-CM | POA: Diagnosis not present

## 2021-05-15 DIAGNOSIS — I5032 Chronic diastolic (congestive) heart failure: Secondary | ICD-10-CM

## 2021-05-15 NOTE — Assessment & Plan Note (Signed)
Mild.  No indication for sleep study.

## 2021-05-15 NOTE — Assessment & Plan Note (Addendum)
Blood  Pressure is better controlled. She struggles with side effects.  She understands that our options are limited in pregnancy and lactation.  Could consider methyldopa, but it is not reliably available.  She is about to start IVF.  Continue current regimen which is bearable in the meantime.  She questions what she can take for nausea.  I do not recommend zofran due to concern for pregnancy.  We will check renal artery Dopplers to assess for renal artery stenosis.  We will also check renin/aldosterone levels to rule out hyperaldosteronism.  She was congratulated on her healthy diet and regular exercise.  If she does become pregnant, recommend that she be seen in our cardio-obstetrics program as she would be high risk

## 2021-05-15 NOTE — Research (Signed)
I saw pt today after Dr. Blenda Mounts follow up visit. Pt is in Dr. Blenda Mounts Virtual care HTN study. Pt filled out research survey. Pt was enrolled in group 1.

## 2021-05-15 NOTE — Patient Instructions (Addendum)
Medication Instructions:  Your physician recommends that you continue on your current medications as directed. Please refer to the Current Medication list given to you today.   Labwork: RENIN/ALDOSTERONE LEVEL SOON   Testing/Procedures: Your physician has requested that you have a renal artery duplex. During this test, an ultrasound is used to evaluate blood flow to the kidneys. Allow one hour for this exam. Do not eat after midnight the day before and avoid carbonated beverages. Take your medications as you usually do.  Follow-Up: 09/03/2020 2:45 PM WITH DR Chilton AT [facility] OFFICE   If you need a refill on your cardiac medications before your next appointment, please call your pharmacy.

## 2021-05-15 NOTE — Assessment & Plan Note (Signed)
She is euvolemic and doing well.  Continue BP control.

## 2021-05-16 DIAGNOSIS — R69 Illness, unspecified: Secondary | ICD-10-CM | POA: Diagnosis not present

## 2021-05-21 DIAGNOSIS — J302 Other seasonal allergic rhinitis: Secondary | ICD-10-CM | POA: Diagnosis not present

## 2021-05-23 ENCOUNTER — Other Ambulatory Visit: Payer: Self-pay

## 2021-05-23 ENCOUNTER — Ambulatory Visit (INDEPENDENT_AMBULATORY_CARE_PROVIDER_SITE_OTHER): Payer: 59

## 2021-05-23 DIAGNOSIS — I10 Essential (primary) hypertension: Secondary | ICD-10-CM

## 2021-05-23 DIAGNOSIS — R69 Illness, unspecified: Secondary | ICD-10-CM | POA: Diagnosis not present

## 2021-05-30 DIAGNOSIS — R69 Illness, unspecified: Secondary | ICD-10-CM | POA: Diagnosis not present

## 2021-06-03 LAB — ALDOSTERONE + RENIN ACTIVITY W/ RATIO
ALDOS/RENIN RATIO: 26.4 (ref 0.0–30.0)
ALDOSTERONE: 11.2 ng/dL (ref 0.0–30.0)
Renin: 0.424 ng/mL/hr (ref 0.167–5.380)

## 2021-06-06 DIAGNOSIS — R69 Illness, unspecified: Secondary | ICD-10-CM | POA: Diagnosis not present

## 2021-06-18 ENCOUNTER — Encounter: Payer: Self-pay | Admitting: Pharmacist Clinician (PhC)/ Clinical Pharmacy Specialist

## 2021-06-18 ENCOUNTER — Encounter (HOSPITAL_BASED_OUTPATIENT_CLINIC_OR_DEPARTMENT_OTHER): Payer: Self-pay | Admitting: Cardiovascular Disease

## 2021-06-18 ENCOUNTER — Other Ambulatory Visit: Payer: Self-pay | Admitting: Cardiovascular Disease

## 2021-06-18 DIAGNOSIS — E288 Other ovarian dysfunction: Secondary | ICD-10-CM | POA: Diagnosis not present

## 2021-06-18 DIAGNOSIS — N979 Female infertility, unspecified: Secondary | ICD-10-CM | POA: Diagnosis not present

## 2021-06-19 NOTE — Telephone Encounter (Signed)
Spoke with patient and will send EKG and last office visit ?

## 2021-06-20 ENCOUNTER — Ambulatory Visit: Payer: 59

## 2021-06-20 ENCOUNTER — Encounter: Payer: Self-pay | Admitting: *Deleted

## 2021-06-20 ENCOUNTER — Ambulatory Visit: Payer: 59 | Attending: Obstetrics and Gynecology | Admitting: Obstetrics and Gynecology

## 2021-06-20 ENCOUNTER — Ambulatory Visit: Payer: 59 | Admitting: *Deleted

## 2021-06-20 ENCOUNTER — Other Ambulatory Visit: Payer: Self-pay

## 2021-06-20 DIAGNOSIS — Z7982 Long term (current) use of aspirin: Secondary | ICD-10-CM | POA: Insufficient documentation

## 2021-06-20 DIAGNOSIS — O34219 Maternal care for unspecified type scar from previous cesarean delivery: Secondary | ICD-10-CM | POA: Diagnosis not present

## 2021-06-20 DIAGNOSIS — D259 Leiomyoma of uterus, unspecified: Secondary | ICD-10-CM | POA: Diagnosis not present

## 2021-06-20 DIAGNOSIS — Z3169 Encounter for other general counseling and advice on procreation: Secondary | ICD-10-CM | POA: Diagnosis not present

## 2021-06-20 DIAGNOSIS — R69 Illness, unspecified: Secondary | ICD-10-CM | POA: Diagnosis not present

## 2021-06-20 DIAGNOSIS — Z79899 Other long term (current) drug therapy: Secondary | ICD-10-CM | POA: Diagnosis not present

## 2021-06-20 DIAGNOSIS — Z8616 Personal history of COVID-19: Secondary | ICD-10-CM | POA: Diagnosis not present

## 2021-06-20 DIAGNOSIS — O09529 Supervision of elderly multigravida, unspecified trimester: Secondary | ICD-10-CM

## 2021-06-20 DIAGNOSIS — E039 Hypothyroidism, unspecified: Secondary | ICD-10-CM

## 2021-06-20 DIAGNOSIS — I10 Essential (primary) hypertension: Secondary | ICD-10-CM | POA: Diagnosis not present

## 2021-06-20 DIAGNOSIS — Z8679 Personal history of other diseases of the circulatory system: Secondary | ICD-10-CM | POA: Insufficient documentation

## 2021-06-20 NOTE — Progress Notes (Signed)
Here for preconception consult. ?BP 145/84  P 81 ?

## 2021-06-20 NOTE — Progress Notes (Signed)
Maternal-Fetal Medicine  ? ?Name: April Dyer ?DOB: Jun 24, 1970 ?MRN: [id number] ?Referring Provider: Philbert Riser, MD ?[facility]  ?Ph: [phone] ? ?Copy to Servando Salina, MD ? ?I had the pleasure of seeing April Dyer today at the [facility]. She is G5 P2032 and is here for preconception consultation. She is planning pregnancy by IVF. Her high-risk problems include: ?-Very advanced maternal age (10 years). ?-Chronic hypertension. ?-Myomas and history of myomectomy. ?-Previous cesarean delivery ?-Hypothyroidism ?-History of heart failure. ?-History of COVID 19 infection (2021) ?-Multiple medications ?-IVF  ? ?Patient has been following at [facility] [place], [place] with Dr. Ron Parker.  She reports she has 15 embryos remaining and is planning to have a female child.  Embryos resulted from fertilizing donor eggs (age 84) and husband sperm. Patient is aware of donor-egg screening protocol followed at the [facility]. ? ?Obstetric history ?05/1988: Term vaginal delivery of a female infant weighing 8 pounds and 5 ounces at birth.  He is in good health, and he has a daughter who is 98 month old. ?08/2020: EF pregnancy.  Elective cesarean delivery at [redacted] weeks gestation of a female infant weighing 7 pounds and 9 ounces at birth.  Her daughter is in good health and is being breast-fed. ?She had 3 early spontaneous miscarriages in 2017, 2019 and in 2021. ? ?GYN history: No history of abnormal Pap smears or cervical surgeries.  No history of breast disease.  Patient is in the postmenopausal period.  She had laparotomy and myomectomy in 2014 and multiple myomas were removed.  Patient reports recent ultrasound showed small myomas. ? ?Past medical history ?Chronic hypertension diagnosed in 1997 and the patient is being followed by her PCP and cardiologist.  She had a cardiology visit in October 2022.  Patient takes 4 antihypertensives including labetalol,  hydralazine, amlodipine, and hydrochlorothiazide.  She reports her home blood pressures are in 130s/60 to 70s mmHg.  Today, her blood pressure at her office is 145/84 mmHg. ? ?She has hypothyroidism and is reportedly euthyroid now.  She takes Synthroid 175 mcg daily. ?Patient does not have diabetes or any other chronic medical conditions.  She does not have obstructive sleep apnea.  She does not have sickle cell trait. ? ?She had COVID-19 infection in 2021 and had prolonged COVID symptoms and they have mostly resolved except fatigue.  She had pulmonary function tests and December 2021 that was reported as normal. ? ?History of heart failure (apparently stress-induced according to the patient) in 2007.  Echocardiography performed in January 2022 showed a left ventricular ejection fraction of 55% to 60%.  Rest of the cardiac anatomy appears normal. ?Recent creatinine levels are within normal range and she does not have chronic kidney disease. ? ?Review of systems: No headaches or visual disturbances.  No history of epistaxis or ear discharge.  No chest pain or palpitations or shortness of breath.  No joint pains or abdominal pain.  No diarrhea or constipation or nausea or vomiting.  No history of recurrent urinary tract infections.  No history of dyspareunia.  No history of vaginal bleeding. ? ?Past surgical history: Myomectomy (2014), cesarean section (2022), salpingectomy (1993), hand surgery and bunionectomy. ? ?Medications:  ?Labetalol 300 mg twice daily ?Hydralazine 25 mg 3 times daily ?Amlodipine 5 mg twice daily ?Hydrochlorothiazide 25 mg daily ?Levothyroxine 175 mcg daily ?Aspirin 81 mg daily ?Prenatal vitamins once daily ? ?Allergies: No known drug allergies. ?Social history: Denies tobacco or drug or alcohol use.  She has been married 18 years (living together for 11 years).  He is African-American and he is in good health. Patient is employed by [employer] and works from home. ?Family history: Mother  is 32 years old and is in good health.  Father died of gunshot injuries at age 31.  No history of venous thromboembolism in the family. ? ?Important investigations available for review ?Echocardiography (January 2022)-left ventricular ejection fraction at 55% to 60% with mild LVH and normal diastolic function. ?Pulmonary function test (December 2021)-normal study. ?Vascular US Renal Artery Duplex (05/2021): Normal study. ?CT Coronary Artery (01/2011): Normal study. ? ?Our concerns include: ?Chronic hypertension ?-Adverse outcomes of severe chronic hypertension include maternal stroke, endorgan damage, coagulation disturbances. Placental abruption is more common. ?-Superimposed preeclampsia occurs in more than 30% of women with chronic hypertension. Maternal complications including preeclampsia may lead to indicated preterm delivery with the birth of an extremely premature infant. Prematurity increases adverse neonatal neurological complications including cerebral palsy. ?I discussed the benefit of low-dose aspirin prophylaxis that helps delaying or preventing preeclampsia. I recommend that she take 2 tabs of low-dose aspirin (162 mg) from 13-weeks' gestation till delivery ?-I discussed the safety profile of antihypertensives.   ?Labetalol can be safely given in pregnancy.  It can be associated with low birthweights.  No increased in congenital malformations have been reported with this drug. ?Hydrocholorothiazide belongs to thiazide group of diuretics. It is not teratogenic in most studies. Thiazides can cause hypovolemia and can adversely affect pregnancies complicated by preeclampsia. Neonatal hypoglycemia, thrombocytopenia and hypokalemia have been seen. ?Amlodipine-No increase in congenital malformations are expected. It is calcium channel blocker. It crosses the placenta. ?Hydralazine can be safely used in pregnancy. No increase in fetal congenital malformations are expected. Third-trimester use can be  associated with neonatal lupus erythematosus in some cases. It is commonly used in the treatment of acute hypertension. ? ?-I discussed ultrasound protocol of monitoring fetal growth assessment and antenatal testing. ?-Timing of delivery is dictated by her history of myomectomy. We recommend delivery at 37 weeks' gestation. If patient develops preeclampsia with severe features, delivery at 34 weeks or at diagnosis after 34 weeks is recommended. ? ?History of myomectomy ?Incidence of uterine rupture is slightly higher with history of myomectomy.  To prevent this complication, we recommend delivery at [redacted] weeks gestation. ? ?Previous cesarean delivery ?I explained that previous cesarean delivery increases the likelihood of placenta previa.  If pregnancy is complicated by placenta previa, the risk of placenta accreta spectrum is about 10%.  I explained to placenta accreta spectrum and the possibility of hysterectomy after cesarean section. ? ?Very Advanced maternal age (AMA) ?-Very advanced maternal age is the term used in medical literature for women aged 55 years or above. ?-AMA carries higher risk of maternal and fetal complications. ?-Since the donor-age was 26 years at retrieval, the risks of chromosomal anomalies are not increased.  ?-Early pregnancy complications include spontaneous miscarriages (at least twice the normal incidence of 15 to 20%) and ectopic pregnancies (increased to three-fold).  She does not have any obvious risk factors for second trimester pregnancy loss. ?-Both spontaneous and indicated preterm births are increased in women with advanced gestational age. ?-Other complications that are seen more-frequently are placental abnormalities (previa), postpartum hemorrhage, and increased cesarean deliveries. ?- ?Fertility Treatment ?Patient reports she had PGS and the embryos are euploid.  I discussed the limitations of preimplantation genetic screening.  I discussed the significance and limitations  of cell free fetal DNA screening that  has greater detection for trisomies 21, 86 and 13 and sex chromosomal anomalies. ? ?Multiple gestations are more common with fertility treatment, but she is likely to have

## 2021-06-21 ENCOUNTER — Encounter: Payer: Self-pay | Admitting: Pharmacist Clinician (PhC)/ Clinical Pharmacy Specialist

## 2021-06-21 ENCOUNTER — Encounter: Payer: Self-pay | Admitting: Obstetrics and Gynecology

## 2021-06-22 DIAGNOSIS — Z3141 Encounter for fertility testing: Secondary | ICD-10-CM | POA: Diagnosis not present

## 2021-06-25 DIAGNOSIS — R69 Illness, unspecified: Secondary | ICD-10-CM | POA: Diagnosis not present

## 2021-06-27 DIAGNOSIS — R69 Illness, unspecified: Secondary | ICD-10-CM | POA: Diagnosis not present

## 2021-06-29 DIAGNOSIS — R69 Illness, unspecified: Secondary | ICD-10-CM | POA: Diagnosis not present

## 2021-07-10 IMAGING — US US MFM OB FOLLOW-UP
1 series · 13 of 28 positions shown · non-contrast
Comparison: none

[Series 1: us mfm ob follow-up · 45 acquisitions, 13 frames shown]
[im 2/45]
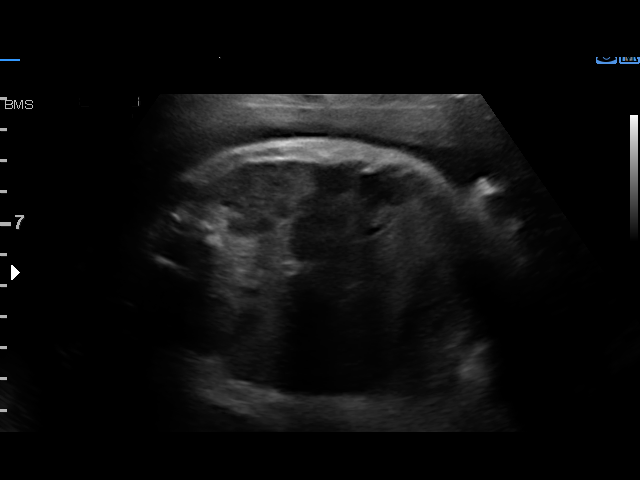
[im 5/45]
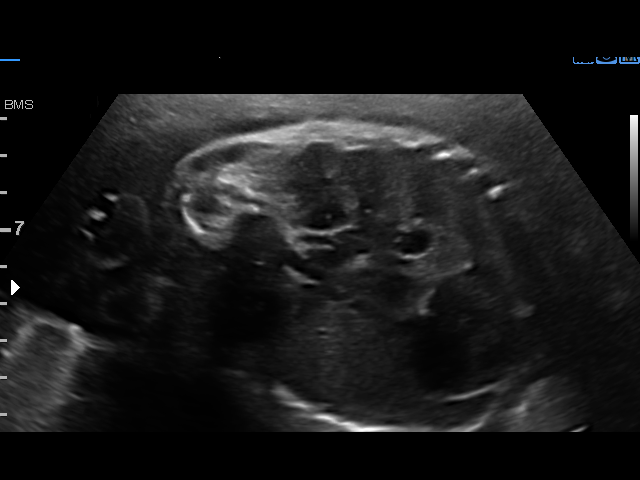
[im 9/45]
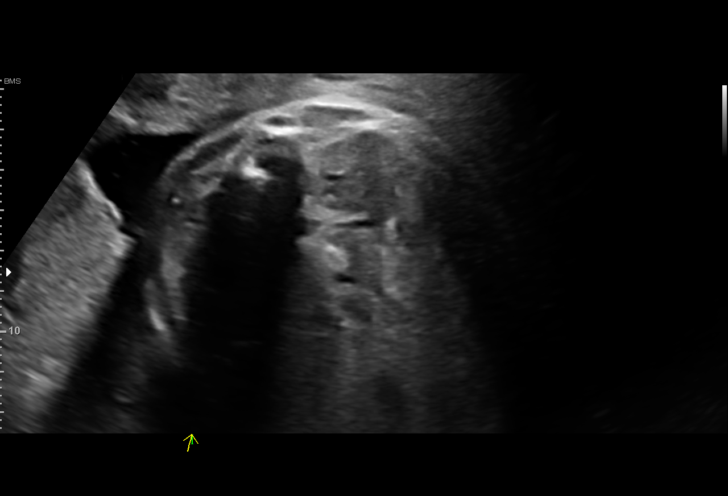
[im 12/45]
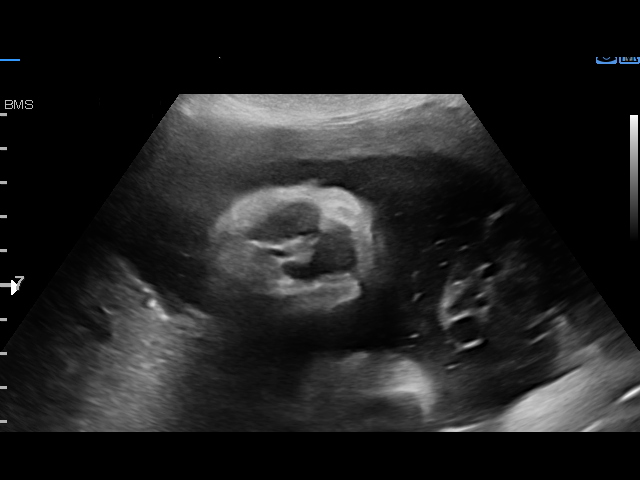
[im 15/45]
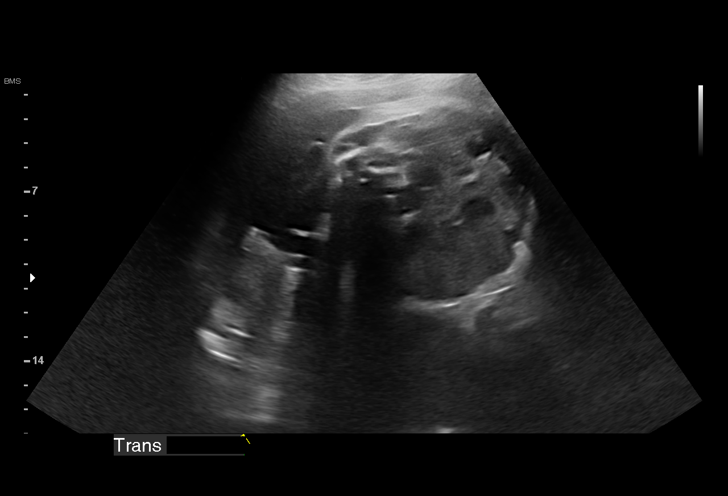
[im 18/45]
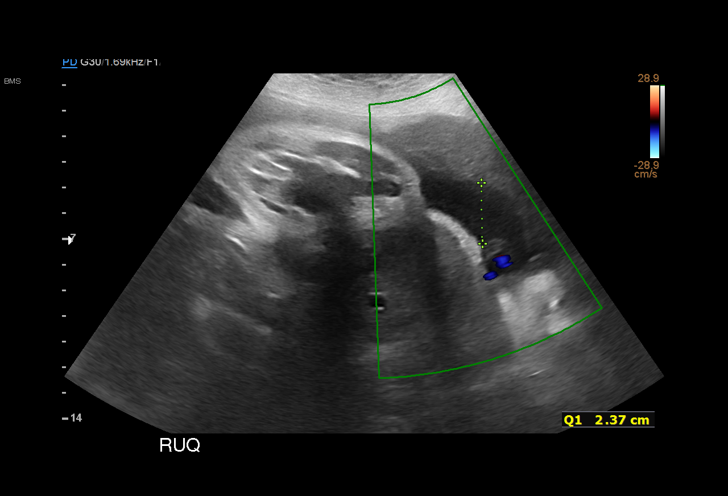
[im 23/45]
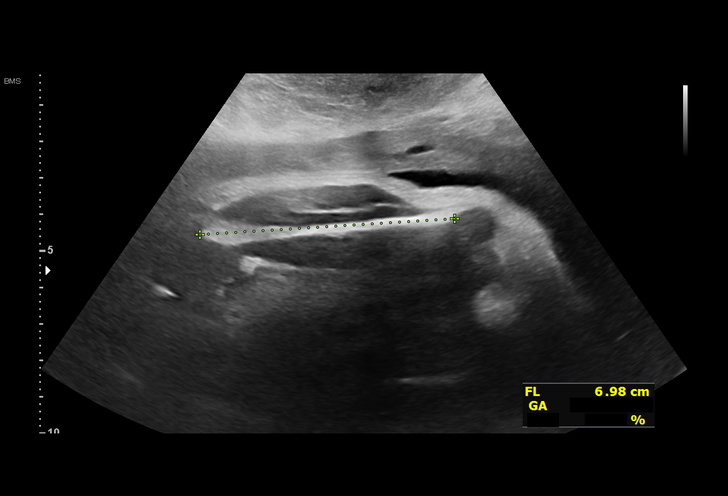
[im 27/45]
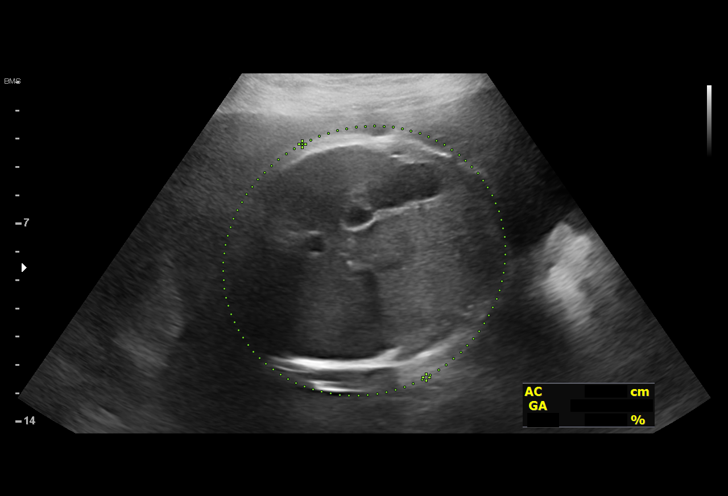
[im 30/45]
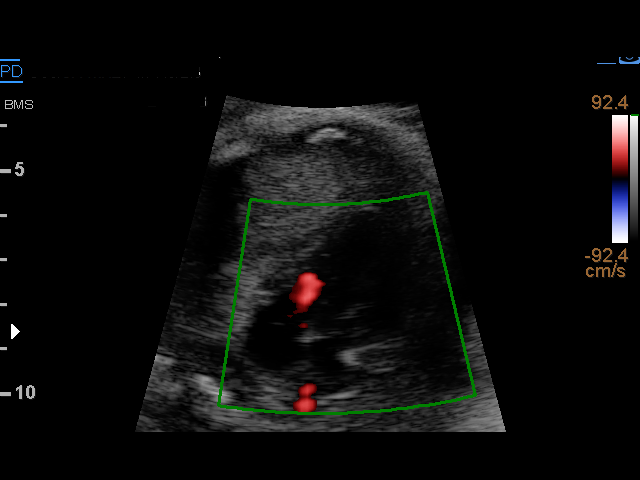
[im 33/45]
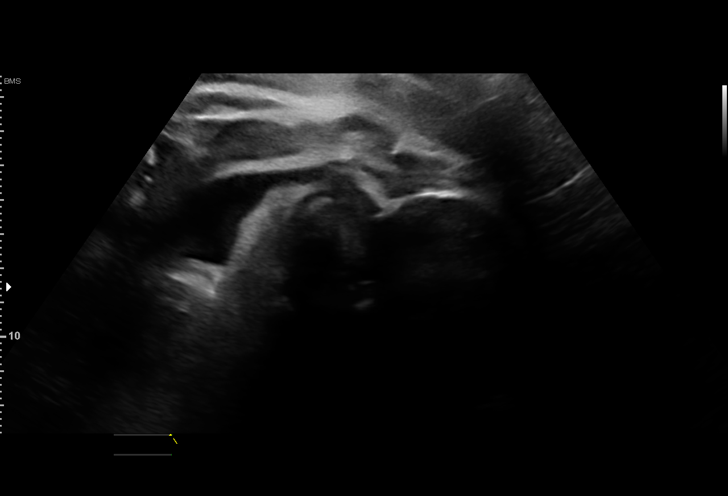
[im 36/45]
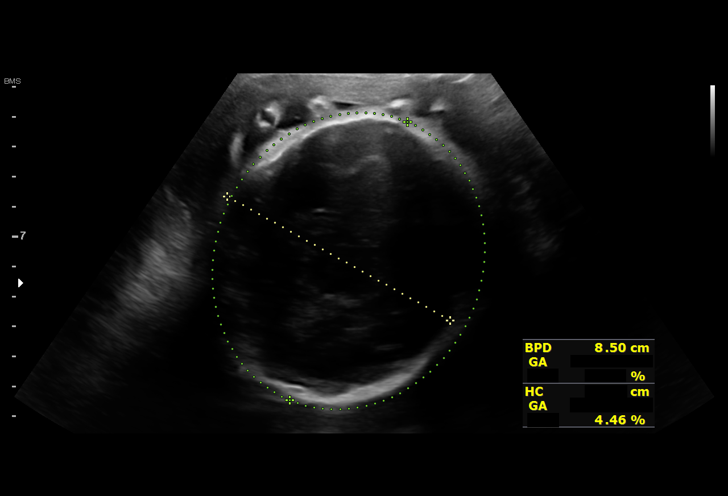
[im 40/45]
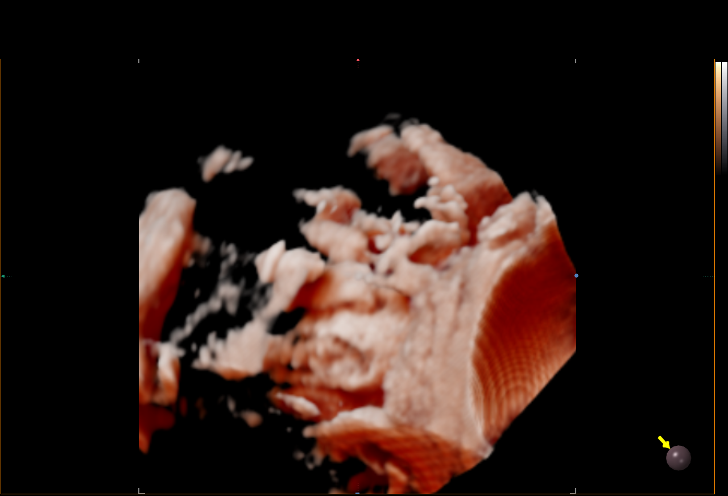
[im 43/45]
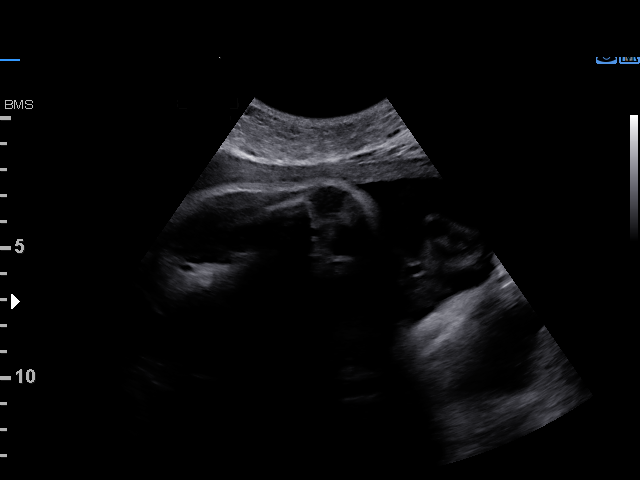

[13 of 28 positions shown; findings below may reference images not displayed]

C. DA SILVA

                                                            OB/GYN

    STRESS                                            SCHNEIDER

Indications

 Advanced maternal age multigravida 35+,
 third trimester (49)
 Hypertension - Chronic/Pre-existing on
 labetalol
 Pregnancy resulting from assisted
 reproductive technology- IVF
 Hypothyroid - synthroid
 Uterine scar, other than C/S - h/o
 myomectomy
 Uterine fibroids
 Obesity complicating pregnancy, third
 trimester
 34 weeks gestation of pregnancy
Fetal Evaluation

 Num Of Fetuses:         1
 Fetal Heart Rate(bpm):  122
 Cardiac Activity:       Observed
 Presentation:           Cephalic
 Placenta:               Posterior
 P. Cord Insertion:      Previously Visualized
 Amniotic Fluid
 AFI FV:      Within normal limits

 AFI Sum(cm)     %Tile       Largest Pocket(cm)
 16.56           60

 RUQ(cm)       RLQ(cm)       LUQ(cm)        LLQ(cm)

Biophysical Evaluation

 Amniotic F.V:   Pocket => 2 cm             F. Tone:        Observed
 F. Movement:    Observed                   Score:          [DATE]
 F. Breathing:   Observed
Biometry

 BPD:      84.8  mm     G. Age:  34w 1d         49  %    CI:         80.9   %    70 - 86
                                                         FL/HC:      23.3   %    19.4 -
 HC:      297.7  mm     G. Age:  33w 0d          3  %    HC/AC:      0.99        0.96 -
 AC:      300.6  mm     G. Age:  34w 0d         51  %    FL/BPD:     82.0   %    71 - 87
 FL:       69.5  mm     G. Age:  35w 5d         79  %    FL/AC:      23.1   %    20 - 24

 LV:        2.1  mm

 Est. FW:    4752  gm      5 lb 5 oz     51  %
OB History

 Gravidity:    5         Term:   1        Prem:   0        SAB:   3
 TOP:          0       Ectopic:  0        Living: 1
Gestational Age

 Clinical EDD:  33w 2d                                        EDD:   09/26/20
 U/S Today:     34w 2d                                        EDD:   09/19/20
 Best:          34w 1d     Det. By:  U/S  (06/13/20)          EDD:   09/20/20
Anatomy

 Cranium:               Previously seen        LVOT:                   Previously seen
 Cavum:                 Previously seen        Aortic Arch:            Previously seen
 Ventricles:            Appears normal         Ductal Arch:            Previously seen
 Choroid Plexus:        Previously seen        Diaphragm:              Appears normal
 Cerebellum:            Previously seen        Stomach:                Appears normal, left
                                                                       sided
 Posterior Fossa:       Previously seen        Abdomen:                Appears normal
 Nuchal Fold:           Not applicable (>20    Abdominal Wall:         Previously seen
                        wks GA)
 Face:                  Orbits and profile     Cord Vessels:           Previously seen
                        previously seen
 Lips:                  Previously seen        Kidneys:                Appear normal
 Palate:                Previously seen        Bladder:                Appears normal
 Thoracic:              Previously seen        Spine:                  Ltd views no
                                                                       intracranial signs of
                                                                       NTD
 Heart:                 Previously seen        Upper Extremities:      Previously seen
 RVOT:                  Previously seen        Lower Extremities:      Previously seen
 Other:  Fetus appears to be female. Heels, 5th digits ,Nasal bone,  SVC IVC,
         3VV/T prev. visualized. Technically difficult due to maternal habitus
         and fetal position.
Cervix Uterus Adnexa

 Cervix
 Not visualized (advanced GA >36wks)
Comments

 This patient was seen for a follow up growth scan due to very
 advanced maternal age (49 years old) and chronic
 hypertension treated with labetalol.  She denies any problems
 since her last exam.
 She was informed that the fetal growth and amniotic fluid
 level appears appropriate for her gestational age.
 A biophysical profile performed today was [DATE].
 Due to her prior myomectomy, she already has a cesarean
 delivery scheduled on August 31, 2020.
 We will continue to see her for weekly biophysical profiles
 until delivery.
 Another biophysical profile was scheduled in 1 week.

## 2021-07-11 DIAGNOSIS — R69 Illness, unspecified: Secondary | ICD-10-CM | POA: Diagnosis not present

## 2021-07-13 DIAGNOSIS — R69 Illness, unspecified: Secondary | ICD-10-CM | POA: Diagnosis not present

## 2021-07-18 DIAGNOSIS — N979 Female infertility, unspecified: Secondary | ICD-10-CM | POA: Diagnosis not present

## 2021-07-19 DIAGNOSIS — R69 Illness, unspecified: Secondary | ICD-10-CM | POA: Diagnosis not present

## 2021-07-20 DIAGNOSIS — N979 Female infertility, unspecified: Secondary | ICD-10-CM | POA: Diagnosis not present

## 2021-07-25 DIAGNOSIS — R69 Illness, unspecified: Secondary | ICD-10-CM | POA: Diagnosis not present

## 2021-07-27 DIAGNOSIS — R69 Illness, unspecified: Secondary | ICD-10-CM | POA: Diagnosis not present

## 2021-08-01 DIAGNOSIS — R69 Illness, unspecified: Secondary | ICD-10-CM | POA: Diagnosis not present

## 2021-08-03 DIAGNOSIS — R69 Illness, unspecified: Secondary | ICD-10-CM | POA: Diagnosis not present

## 2021-08-03 DIAGNOSIS — N979 Female infertility, unspecified: Secondary | ICD-10-CM | POA: Diagnosis not present

## 2021-08-03 DIAGNOSIS — Z3202 Encounter for pregnancy test, result negative: Secondary | ICD-10-CM | POA: Diagnosis not present

## 2021-08-03 DIAGNOSIS — Z3201 Encounter for pregnancy test, result positive: Secondary | ICD-10-CM | POA: Diagnosis not present

## 2021-08-07 DIAGNOSIS — Z3201 Encounter for pregnancy test, result positive: Secondary | ICD-10-CM | POA: Diagnosis not present

## 2021-08-08 DIAGNOSIS — R69 Illness, unspecified: Secondary | ICD-10-CM | POA: Diagnosis not present

## 2021-08-10 DIAGNOSIS — R69 Illness, unspecified: Secondary | ICD-10-CM | POA: Diagnosis not present

## 2021-08-14 ENCOUNTER — Inpatient Hospital Stay (HOSPITAL_COMMUNITY): Payer: 59

## 2021-08-14 ENCOUNTER — Other Ambulatory Visit: Payer: Self-pay

## 2021-08-14 ENCOUNTER — Inpatient Hospital Stay (HOSPITAL_COMMUNITY)
Admission: AD | Admit: 2021-08-14 | Discharge: 2021-08-14 | Disposition: A | Discharge status: Home / Self Care | Payer: 59 | Attending: Obstetrics and Gynecology | Admitting: Obstetrics and Gynecology

## 2021-08-14 ENCOUNTER — Encounter (HOSPITAL_COMMUNITY): Payer: Self-pay | Admitting: Obstetrics and Gynecology

## 2021-08-14 DIAGNOSIS — D252 Subserosal leiomyoma of uterus: Secondary | ICD-10-CM | POA: Diagnosis not present

## 2021-08-14 DIAGNOSIS — O208 Other hemorrhage in early pregnancy: Secondary | ICD-10-CM | POA: Diagnosis not present

## 2021-08-14 DIAGNOSIS — Z3A01 Less than 8 weeks gestation of pregnancy: Secondary | ICD-10-CM | POA: Insufficient documentation

## 2021-08-14 DIAGNOSIS — Z79899 Other long term (current) drug therapy: Secondary | ICD-10-CM | POA: Insufficient documentation

## 2021-08-14 DIAGNOSIS — O200 Threatened abortion: Secondary | ICD-10-CM | POA: Diagnosis not present

## 2021-08-14 DIAGNOSIS — D251 Intramural leiomyoma of uterus: Secondary | ICD-10-CM | POA: Diagnosis not present

## 2021-08-14 DIAGNOSIS — O3411 Maternal care for benign tumor of corpus uteri, first trimester: Secondary | ICD-10-CM | POA: Insufficient documentation

## 2021-08-14 DIAGNOSIS — O10011 Pre-existing essential hypertension complicating pregnancy, first trimester: Secondary | ICD-10-CM

## 2021-08-14 DIAGNOSIS — R300 Dysuria: Secondary | ICD-10-CM

## 2021-08-14 DIAGNOSIS — O10911 Unspecified pre-existing hypertension complicating pregnancy, first trimester: Secondary | ICD-10-CM | POA: Diagnosis not present

## 2021-08-14 LAB — URINALYSIS, ROUTINE W REFLEX MICROSCOPIC
Bilirubin Urine: NEGATIVE
Glucose, UA: NEGATIVE mg/dL
Ketones, ur: NEGATIVE mg/dL
Leukocytes,Ua: NEGATIVE
Nitrite: NEGATIVE
Protein, ur: 30 mg/dL — AB
RBC / HPF: >50 RBC/hpf — ABNORMAL HIGH (ref 0–5)
Specific Gravity, Urine: 1.029 (ref 1.005–1.030)
pH: 5 (ref 5.0–8.0)

## 2021-08-14 LAB — CBC
HCT: 38.6 % (ref 36.0–46.0)
Hemoglobin: 13 g/dL (ref 12.0–15.0)
MCH: 33.3 pg (ref 26.0–34.0)
MCHC: 33.7 g/dL (ref 30.0–36.0)
MCV: 99 fL (ref 80.0–100.0)
Platelets: 231 10*3/uL (ref 150–400)
RBC: 3.9 MIL/uL (ref 3.87–5.11)
RDW: 13.9 % (ref 11.5–15.5)
WBC: 10.6 10*3/uL — ABNORMAL HIGH (ref 4.0–10.5)
nRBC: 0 % (ref 0.0–0.2)

## 2021-08-14 LAB — HCG, QUANTITATIVE, PREGNANCY: hCG, Beta Chain, Quant, S: 25214 m[IU]/mL — ABNORMAL HIGH (ref <5)

## 2021-08-14 LAB — POCT PREGNANCY, URINE: Preg Test, Ur: POSITIVE — AB

## 2021-08-14 MED ORDER — LABETALOL HCL 100 MG PO TABS
300.0000 mg | ORAL_TABLET | Freq: Two times a day (BID) | ORAL | Status: DC
  Administered 2021-08-14: 300 mg via ORAL
  Filled 2021-08-14: qty 3

## 2021-08-14 NOTE — Progress Notes (Signed)
Written and verbal d/c instructions given and understanding voiced. 

## 2021-08-14 NOTE — MAU Note (Signed)
Dr Garwin Brothers called into unit and spoke with pt about u/s results.

## 2021-08-14 NOTE — Discharge Instructions (Signed)
THREATENED ABORTION PRECAUTIONS
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
## 2021-08-14 NOTE — MAU Note (Addendum)
...  April Dyer is a 51 y.o. at Unknown here in MAU reporting: Vaginal bleeding since 1530 today. She reports the blood was gushing and she had several ping pong sized blood clots come out. Endorses lower abdominal cramping that began this past Saturday that is intermittent. She reports she has been experiencing burning with urination for the past four days. Denies recent IC.  IVF on 5/1.  Pain score:  6/10 lower abdomen  Lab orders placed from triage: POCT Pregnancy, UA

## 2021-08-15 LAB — CULTURE, OB URINE: Culture: <10000 — AB

## 2021-08-17 DIAGNOSIS — R69 Illness, unspecified: Secondary | ICD-10-CM | POA: Diagnosis not present

## 2021-08-21 ENCOUNTER — Telehealth (HOSPITAL_BASED_OUTPATIENT_CLINIC_OR_DEPARTMENT_OTHER): Payer: Self-pay | Admitting: *Deleted

## 2021-08-21 ENCOUNTER — Encounter (HOSPITAL_BASED_OUTPATIENT_CLINIC_OR_DEPARTMENT_OTHER): Payer: Self-pay | Admitting: Cardiovascular Disease

## 2021-08-21 NOTE — Telephone Encounter (Signed)
Matthias Hughs [name]  P Cv Div Dwb Triage (supporting Skeet Latch, MD) 3 hours ago (2:16 PM)   HI Dr. Oval Linsey, My OB/GYN wanted me to reach out to you and ask if it is ok that I  continue to take the hydralazine since I am pregnant. I am currently 7 weeks and have been asked to halt taking it until you tell me what to do. My dosage is '25MG'$  3x a day.    Above [employer] received   Will forward to Dr Oval Linsey for review

## 2021-08-21 NOTE — Telephone Encounter (Signed)
Please advise 

## 2021-08-22 DIAGNOSIS — R69 Illness, unspecified: Secondary | ICD-10-CM | POA: Diagnosis not present

## 2021-08-22 DIAGNOSIS — Z364 Encounter for antenatal screening for fetal growth retardation: Secondary | ICD-10-CM | POA: Diagnosis not present

## 2021-08-23 NOTE — Telephone Encounter (Signed)
Good afternoon    Congratulations! Dr Oval Linsey said this is ok to take during pregnancy. She would like you to monitor and log your blood pressure, bring to follow up in couple weeks   April Dyer   Above [place] message sent to patient

## 2021-08-28 DIAGNOSIS — R946 Abnormal results of thyroid function studies: Secondary | ICD-10-CM | POA: Diagnosis not present

## 2021-09-03 ENCOUNTER — Telehealth: Payer: Self-pay

## 2021-09-03 ENCOUNTER — Ambulatory Visit (HOSPITAL_BASED_OUTPATIENT_CLINIC_OR_DEPARTMENT_OTHER): Payer: 59 | Admitting: Cardiovascular Disease
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
## 2021-09-04 ENCOUNTER — Encounter (HOSPITAL_BASED_OUTPATIENT_CLINIC_OR_DEPARTMENT_OTHER): Payer: Self-pay | Admitting: Cardiovascular Disease

## 2021-09-04 ENCOUNTER — Ambulatory Visit (INDEPENDENT_AMBULATORY_CARE_PROVIDER_SITE_OTHER): Payer: 59 | Admitting: Cardiovascular Disease

## 2021-09-04 DIAGNOSIS — I10 Essential (primary) hypertension: Secondary | ICD-10-CM

## 2021-09-04 DIAGNOSIS — I5032 Chronic diastolic (congestive) heart failure: Secondary | ICD-10-CM

## 2021-09-04 NOTE — Patient Instructions (Addendum)
Medication Instructions:  TAKE YOUR AMLODIPINE 10 MG ONCE DAILY   Labwork: NONE  Testing/Procedures: NONE  Follow-Up: 10/01/2021 10:30 AM WITH DR [facility]

## 2021-09-04 NOTE — Assessment & Plan Note (Signed)
Volume status is stable.  Managing blood pressure as above.

## 2021-09-04 NOTE — Assessment & Plan Note (Addendum)
Chronic hypertension.  She is now [redacted] weeks pregnant.  BP is controlled when she had antiemetics.  She is now only able to keep her medications down once per day.  We will have her start taking her amlodipine 10 mg daily instead of 5 mg twice daily.  Continue hydralazine, hydrochlorothiazide, and labetalol.  I have reached out to Dr. Garwin Brothers to see what we can prescribe for her to help with her recurrent emesis.  She is already on aspirin.

## 2021-09-05 DIAGNOSIS — Z331 Pregnant state, incidental: Secondary | ICD-10-CM | POA: Diagnosis not present

## 2021-09-05 DIAGNOSIS — Z364 Encounter for antenatal screening for fetal growth retardation: Secondary | ICD-10-CM | POA: Diagnosis not present

## 2021-09-26 ENCOUNTER — Encounter (HOSPITAL_BASED_OUTPATIENT_CLINIC_OR_DEPARTMENT_OTHER): Payer: Self-pay | Admitting: Cardiovascular Disease

## 2021-09-26 DIAGNOSIS — Z713 Dietary counseling and surveillance: Secondary | ICD-10-CM | POA: Diagnosis not present

## 2021-09-26 DIAGNOSIS — Z13220 Encounter for screening for lipoid disorders: Secondary | ICD-10-CM | POA: Diagnosis not present

## 2021-09-26 DIAGNOSIS — Z131 Encounter for screening for diabetes mellitus: Secondary | ICD-10-CM | POA: Diagnosis not present

## 2021-09-27 ENCOUNTER — Ambulatory Visit: Payer: 59

## 2021-10-01 ENCOUNTER — Ambulatory Visit (HOSPITAL_BASED_OUTPATIENT_CLINIC_OR_DEPARTMENT_OTHER): Payer: 59 | Admitting: Cardiovascular Disease

## 2021-10-03 DIAGNOSIS — N912 Amenorrhea, unspecified: Secondary | ICD-10-CM | POA: Diagnosis not present

## 2021-10-03 LAB — OB RESULTS CONSOLE HIV ANTIBODY (ROUTINE TESTING): HIV: NONREACTIVE

## 2021-10-03 LAB — OB RESULTS CONSOLE ANTIBODY SCREEN: Antibody Screen: NEGATIVE

## 2021-10-03 LAB — HEPATITIS C ANTIBODY: HCV Ab: NEGATIVE

## 2021-10-03 LAB — OB RESULTS CONSOLE HEPATITIS B SURFACE ANTIGEN: Hepatitis B Surface Ag: NEGATIVE

## 2021-10-03 LAB — OB RESULTS CONSOLE RUBELLA ANTIBODY, IGM: Rubella: IMMUNE

## 2021-10-03 LAB — OB RESULTS CONSOLE RPR: RPR: NONREACTIVE

## 2021-10-04 DIAGNOSIS — N912 Amenorrhea, unspecified: Secondary | ICD-10-CM | POA: Diagnosis not present

## 2021-10-04 DIAGNOSIS — O09291 Supervision of pregnancy with other poor reproductive or obstetric history, first trimester: Secondary | ICD-10-CM | POA: Diagnosis not present

## 2021-10-04 DIAGNOSIS — O10011 Pre-existing essential hypertension complicating pregnancy, first trimester: Secondary | ICD-10-CM | POA: Diagnosis not present

## 2021-10-04 DIAGNOSIS — I10 Essential (primary) hypertension: Secondary | ICD-10-CM | POA: Diagnosis not present

## 2021-10-04 DIAGNOSIS — O99211 Obesity complicating pregnancy, first trimester: Secondary | ICD-10-CM | POA: Diagnosis not present

## 2021-10-04 DIAGNOSIS — O3429 Maternal care due to uterine scar from other previous surgery: Secondary | ICD-10-CM | POA: Diagnosis not present

## 2021-10-04 DIAGNOSIS — E282 Polycystic ovarian syndrome: Secondary | ICD-10-CM | POA: Diagnosis not present

## 2021-10-04 DIAGNOSIS — O09521 Supervision of elderly multigravida, first trimester: Secondary | ICD-10-CM | POA: Diagnosis not present

## 2021-10-04 DIAGNOSIS — O34219 Maternal care for unspecified type scar from previous cesarean delivery: Secondary | ICD-10-CM | POA: Diagnosis not present

## 2021-10-04 DIAGNOSIS — O09529 Supervision of elderly multigravida, unspecified trimester: Secondary | ICD-10-CM | POA: Diagnosis not present

## 2021-10-04 DIAGNOSIS — O99281 Endocrine, nutritional and metabolic diseases complicating pregnancy, first trimester: Secondary | ICD-10-CM | POA: Diagnosis not present

## 2021-10-04 DIAGNOSIS — O09819 Supervision of pregnancy resulting from assisted reproductive technology, unspecified trimester: Secondary | ICD-10-CM | POA: Diagnosis not present

## 2021-10-09 DIAGNOSIS — I10 Essential (primary) hypertension: Secondary | ICD-10-CM | POA: Diagnosis not present

## 2021-10-09 DIAGNOSIS — K219 Gastro-esophageal reflux disease without esophagitis: Secondary | ICD-10-CM | POA: Diagnosis not present

## 2021-10-09 DIAGNOSIS — I119 Hypertensive heart disease without heart failure: Secondary | ICD-10-CM | POA: Diagnosis not present

## 2021-10-09 DIAGNOSIS — Z0000 Encounter for general adult medical examination without abnormal findings: Secondary | ICD-10-CM | POA: Diagnosis not present

## 2021-10-09 DIAGNOSIS — Z3492 Encounter for supervision of normal pregnancy, unspecified, second trimester: Secondary | ICD-10-CM | POA: Diagnosis not present

## 2021-10-09 DIAGNOSIS — G43909 Migraine, unspecified, not intractable, without status migrainosus: Secondary | ICD-10-CM | POA: Diagnosis not present

## 2021-10-09 DIAGNOSIS — F418 Other specified anxiety disorders: Secondary | ICD-10-CM | POA: Diagnosis not present

## 2021-10-09 DIAGNOSIS — N289 Disorder of kidney and ureter, unspecified: Secondary | ICD-10-CM | POA: Diagnosis not present

## 2021-10-09 DIAGNOSIS — E039 Hypothyroidism, unspecified: Secondary | ICD-10-CM | POA: Diagnosis not present

## 2021-10-09 DIAGNOSIS — R0602 Shortness of breath: Secondary | ICD-10-CM | POA: Diagnosis not present

## 2021-10-15 ENCOUNTER — Telehealth: Payer: Self-pay

## 2021-10-15 ENCOUNTER — Other Ambulatory Visit: Payer: Self-pay | Admitting: Obstetrics and Gynecology

## 2021-10-15 DIAGNOSIS — Z3689 Encounter for other specified antenatal screening: Secondary | ICD-10-CM

## 2021-10-18 DIAGNOSIS — R69 Illness, unspecified: Secondary | ICD-10-CM | POA: Diagnosis not present

## 2021-10-25 DIAGNOSIS — O3429 Maternal care due to uterine scar from other previous surgery: Secondary | ICD-10-CM | POA: Diagnosis not present

## 2021-10-25 DIAGNOSIS — O09529 Supervision of elderly multigravida, unspecified trimester: Secondary | ICD-10-CM | POA: Diagnosis not present

## 2021-10-25 DIAGNOSIS — O10012 Pre-existing essential hypertension complicating pregnancy, second trimester: Secondary | ICD-10-CM | POA: Diagnosis not present

## 2021-10-25 DIAGNOSIS — O09819 Supervision of pregnancy resulting from assisted reproductive technology, unspecified trimester: Secondary | ICD-10-CM | POA: Diagnosis not present

## 2021-10-25 DIAGNOSIS — O99282 Endocrine, nutritional and metabolic diseases complicating pregnancy, second trimester: Secondary | ICD-10-CM | POA: Diagnosis not present

## 2021-10-25 DIAGNOSIS — O99212 Obesity complicating pregnancy, second trimester: Secondary | ICD-10-CM | POA: Diagnosis not present

## 2021-10-25 DIAGNOSIS — Z361 Encounter for antenatal screening for raised alphafetoprotein level: Secondary | ICD-10-CM | POA: Diagnosis not present

## 2021-10-25 DIAGNOSIS — O09292 Supervision of pregnancy with other poor reproductive or obstetric history, second trimester: Secondary | ICD-10-CM | POA: Diagnosis not present

## 2021-10-25 DIAGNOSIS — O09522 Supervision of elderly multigravida, second trimester: Secondary | ICD-10-CM | POA: Diagnosis not present

## 2021-10-25 DIAGNOSIS — O34219 Maternal care for unspecified type scar from previous cesarean delivery: Secondary | ICD-10-CM | POA: Diagnosis not present

## 2021-11-07 DIAGNOSIS — R69 Illness, unspecified: Secondary | ICD-10-CM | POA: Diagnosis not present

## 2021-11-14 DIAGNOSIS — R69 Illness, unspecified: Secondary | ICD-10-CM | POA: Diagnosis not present

## 2021-11-16 ENCOUNTER — Ambulatory Visit: Payer: 59 | Attending: Obstetrics and Gynecology

## 2021-11-16 ENCOUNTER — Ambulatory Visit: Payer: 59 | Admitting: *Deleted

## 2021-11-16 VITALS — BP 141/77 | HR 87

## 2021-11-16 DIAGNOSIS — O99282 Endocrine, nutritional and metabolic diseases complicating pregnancy, second trimester: Secondary | ICD-10-CM

## 2021-11-16 DIAGNOSIS — O09522 Supervision of elderly multigravida, second trimester: Secondary | ICD-10-CM

## 2021-11-16 DIAGNOSIS — O09812 Supervision of pregnancy resulting from assisted reproductive technology, second trimester: Secondary | ICD-10-CM | POA: Diagnosis not present

## 2021-11-16 DIAGNOSIS — Z3689 Encounter for other specified antenatal screening: Secondary | ICD-10-CM | POA: Insufficient documentation

## 2021-11-16 DIAGNOSIS — O3429 Maternal care due to uterine scar from other previous surgery: Secondary | ICD-10-CM | POA: Diagnosis not present

## 2021-11-16 DIAGNOSIS — O34219 Maternal care for unspecified type scar from previous cesarean delivery: Secondary | ICD-10-CM | POA: Diagnosis not present

## 2021-11-16 DIAGNOSIS — D259 Leiomyoma of uterus, unspecified: Secondary | ICD-10-CM

## 2021-11-16 DIAGNOSIS — E669 Obesity, unspecified: Secondary | ICD-10-CM | POA: Diagnosis not present

## 2021-11-16 DIAGNOSIS — O3412 Maternal care for benign tumor of corpus uteri, second trimester: Secondary | ICD-10-CM | POA: Diagnosis not present

## 2021-11-16 DIAGNOSIS — R69 Illness, unspecified: Secondary | ICD-10-CM | POA: Diagnosis not present

## 2021-11-16 DIAGNOSIS — E039 Hypothyroidism, unspecified: Secondary | ICD-10-CM | POA: Diagnosis not present

## 2021-11-16 DIAGNOSIS — O09292 Supervision of pregnancy with other poor reproductive or obstetric history, second trimester: Secondary | ICD-10-CM | POA: Diagnosis not present

## 2021-11-16 DIAGNOSIS — O10012 Pre-existing essential hypertension complicating pregnancy, second trimester: Secondary | ICD-10-CM | POA: Diagnosis not present

## 2021-11-16 DIAGNOSIS — O99212 Obesity complicating pregnancy, second trimester: Secondary | ICD-10-CM

## 2021-11-16 DIAGNOSIS — Z3A19 19 weeks gestation of pregnancy: Secondary | ICD-10-CM

## 2021-11-19 ENCOUNTER — Other Ambulatory Visit: Payer: Self-pay | Admitting: *Deleted

## 2021-11-19 DIAGNOSIS — O09522 Supervision of elderly multigravida, second trimester: Secondary | ICD-10-CM

## 2021-11-21 DIAGNOSIS — R69 Illness, unspecified: Secondary | ICD-10-CM | POA: Diagnosis not present

## 2021-11-22 DIAGNOSIS — O99212 Obesity complicating pregnancy, second trimester: Secondary | ICD-10-CM | POA: Diagnosis not present

## 2021-11-22 DIAGNOSIS — O34219 Maternal care for unspecified type scar from previous cesarean delivery: Secondary | ICD-10-CM | POA: Diagnosis not present

## 2021-11-22 DIAGNOSIS — O09522 Supervision of elderly multigravida, second trimester: Secondary | ICD-10-CM | POA: Diagnosis not present

## 2021-11-22 DIAGNOSIS — O99282 Endocrine, nutritional and metabolic diseases complicating pregnancy, second trimester: Secondary | ICD-10-CM | POA: Diagnosis not present

## 2021-11-22 DIAGNOSIS — O10012 Pre-existing essential hypertension complicating pregnancy, second trimester: Secondary | ICD-10-CM | POA: Diagnosis not present

## 2021-11-22 DIAGNOSIS — O09819 Supervision of pregnancy resulting from assisted reproductive technology, unspecified trimester: Secondary | ICD-10-CM | POA: Diagnosis not present

## 2021-11-22 DIAGNOSIS — O09292 Supervision of pregnancy with other poor reproductive or obstetric history, second trimester: Secondary | ICD-10-CM | POA: Diagnosis not present

## 2021-11-22 DIAGNOSIS — O3429 Maternal care due to uterine scar from other previous surgery: Secondary | ICD-10-CM | POA: Diagnosis not present

## 2021-11-23 DIAGNOSIS — R69 Illness, unspecified: Secondary | ICD-10-CM | POA: Diagnosis not present

## 2021-11-28 DIAGNOSIS — R69 Illness, unspecified: Secondary | ICD-10-CM | POA: Diagnosis not present

## 2021-12-14 ENCOUNTER — Other Ambulatory Visit: Payer: Self-pay | Admitting: *Deleted

## 2021-12-14 ENCOUNTER — Ambulatory Visit: Payer: 59 | Attending: Maternal & Fetal Medicine

## 2021-12-14 ENCOUNTER — Ambulatory Visit: Payer: 59 | Admitting: *Deleted

## 2021-12-14 VITALS — BP 135/73 | HR 84

## 2021-12-14 DIAGNOSIS — Z3A23 23 weeks gestation of pregnancy: Secondary | ICD-10-CM

## 2021-12-14 DIAGNOSIS — O09522 Supervision of elderly multigravida, second trimester: Secondary | ICD-10-CM | POA: Diagnosis not present

## 2021-12-14 DIAGNOSIS — O09299 Supervision of pregnancy with other poor reproductive or obstetric history, unspecified trimester: Secondary | ICD-10-CM

## 2021-12-14 DIAGNOSIS — O99212 Obesity complicating pregnancy, second trimester: Secondary | ICD-10-CM

## 2021-12-14 DIAGNOSIS — O3412 Maternal care for benign tumor of corpus uteri, second trimester: Secondary | ICD-10-CM

## 2021-12-14 DIAGNOSIS — O10012 Pre-existing essential hypertension complicating pregnancy, second trimester: Secondary | ICD-10-CM

## 2021-12-14 DIAGNOSIS — Z363 Encounter for antenatal screening for malformations: Secondary | ICD-10-CM

## 2021-12-14 DIAGNOSIS — O34219 Maternal care for unspecified type scar from previous cesarean delivery: Secondary | ICD-10-CM | POA: Diagnosis not present

## 2021-12-14 DIAGNOSIS — D259 Leiomyoma of uterus, unspecified: Secondary | ICD-10-CM

## 2021-12-14 DIAGNOSIS — E039 Hypothyroidism, unspecified: Secondary | ICD-10-CM

## 2021-12-14 DIAGNOSIS — E669 Obesity, unspecified: Secondary | ICD-10-CM

## 2021-12-14 DIAGNOSIS — O10912 Unspecified pre-existing hypertension complicating pregnancy, second trimester: Secondary | ICD-10-CM

## 2021-12-14 DIAGNOSIS — O09812 Supervision of pregnancy resulting from assisted reproductive technology, second trimester: Secondary | ICD-10-CM | POA: Diagnosis not present

## 2021-12-14 DIAGNOSIS — O99282 Endocrine, nutritional and metabolic diseases complicating pregnancy, second trimester: Secondary | ICD-10-CM | POA: Diagnosis not present

## 2021-12-14 DIAGNOSIS — O09292 Supervision of pregnancy with other poor reproductive or obstetric history, second trimester: Secondary | ICD-10-CM | POA: Diagnosis not present

## 2021-12-14 DIAGNOSIS — Z9889 Other specified postprocedural states: Secondary | ICD-10-CM

## 2021-12-14 DIAGNOSIS — O283 Abnormal ultrasonic finding on antenatal screening of mother: Secondary | ICD-10-CM

## 2021-12-18 DIAGNOSIS — R69 Illness, unspecified: Secondary | ICD-10-CM | POA: Diagnosis not present

## 2021-12-20 DIAGNOSIS — O34219 Maternal care for unspecified type scar from previous cesarean delivery: Secondary | ICD-10-CM | POA: Diagnosis not present

## 2021-12-20 DIAGNOSIS — O10012 Pre-existing essential hypertension complicating pregnancy, second trimester: Secondary | ICD-10-CM | POA: Diagnosis not present

## 2021-12-20 DIAGNOSIS — O09819 Supervision of pregnancy resulting from assisted reproductive technology, unspecified trimester: Secondary | ICD-10-CM | POA: Diagnosis not present

## 2021-12-20 DIAGNOSIS — O09522 Supervision of elderly multigravida, second trimester: Secondary | ICD-10-CM | POA: Diagnosis not present

## 2021-12-20 DIAGNOSIS — O99212 Obesity complicating pregnancy, second trimester: Secondary | ICD-10-CM | POA: Diagnosis not present

## 2021-12-20 DIAGNOSIS — O09292 Supervision of pregnancy with other poor reproductive or obstetric history, second trimester: Secondary | ICD-10-CM | POA: Diagnosis not present

## 2021-12-20 DIAGNOSIS — O3429 Maternal care due to uterine scar from other previous surgery: Secondary | ICD-10-CM | POA: Diagnosis not present

## 2021-12-20 DIAGNOSIS — O99282 Endocrine, nutritional and metabolic diseases complicating pregnancy, second trimester: Secondary | ICD-10-CM | POA: Diagnosis not present

## 2021-12-23 IMAGING — CT CT HEART MORP W/ CTA COR W/ SCORE W/ CA W/CM &/OR W/O CM
4 of 7 series · 8 of 20 positions shown, 9 images · IV contrast (APPLIED)
Comparison: CT AP 07/24/2016

Addendum:
CLINICAL DATA: 49 yo female with chest pain

EXAM:
Cardiac/Coronary CTA
TECHNIQUE: A non-contrast, gated CT scan was obtained with axial slices of 3 mm
through the heart for calcium scoring. Calcium scoring was performed
using the Agatston method. A 120 kV prospective, gated, contrast
cardiac scan was obtained. Gantry rotation speed was 250 msecs and
collimation was 0.6 mm. Two sublingual nitroglycerin tablets (0.8
mg) were given. The 3D data set was reconstructed in 5% intervals of
the 35-75% of the R-R cycle. Diastolic phases were analyzed on a
dedicated workstation using MPR, MIP, and VRT modes. The patient
received 95 cc of contrast.

[Series 7: best diast 71 % · axial · 0.39mm/px · z∈[+1210,+1245]mm · 2 of 268 slices shown]
[im 90/268  vessel]
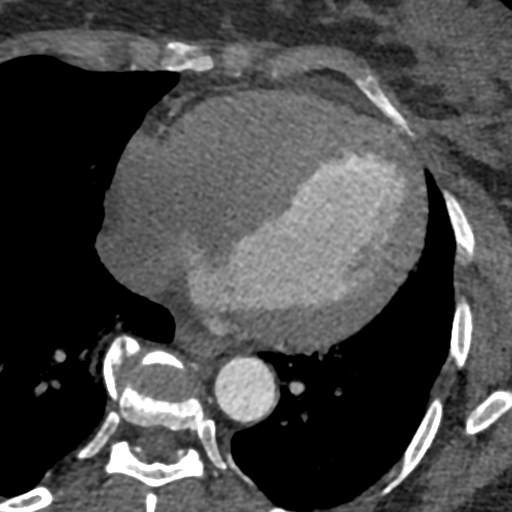
[im 179/268  vessel]
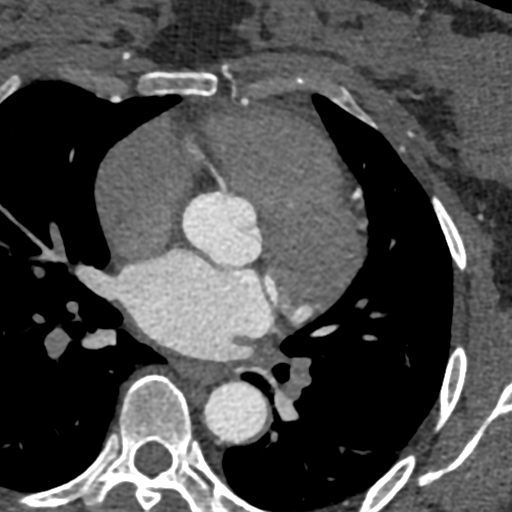

[Series 8: best syst · axial · 0.39mm/px · z∈[+1210,+1245]mm · 2 of 268 slices shown, 3 images]
[im 90/268  vessel]
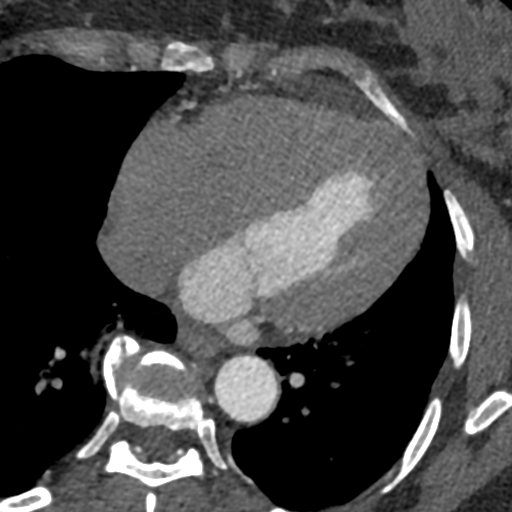
[im 90/268  lung]
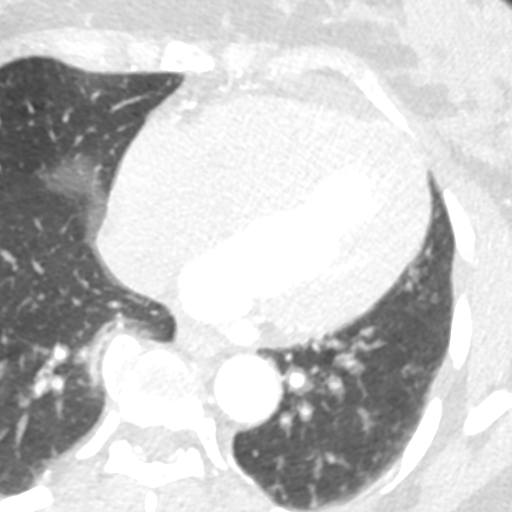
[im 179/268  vessel]
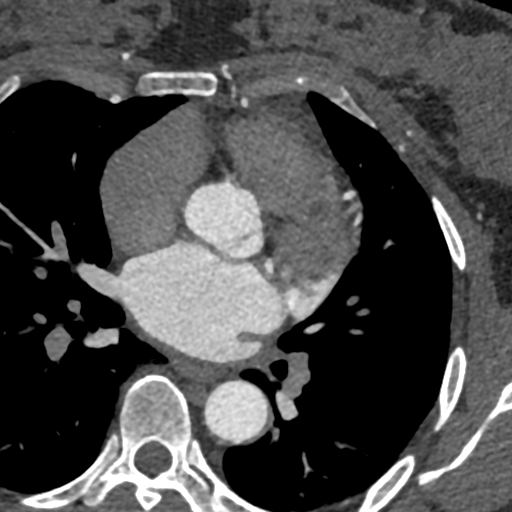

[Series 9: ts diast sharp · axial · 0.39mm/px · z∈[+1210,+1245]mm · 2 of 268 slices shown]
[im 90/268  lung]
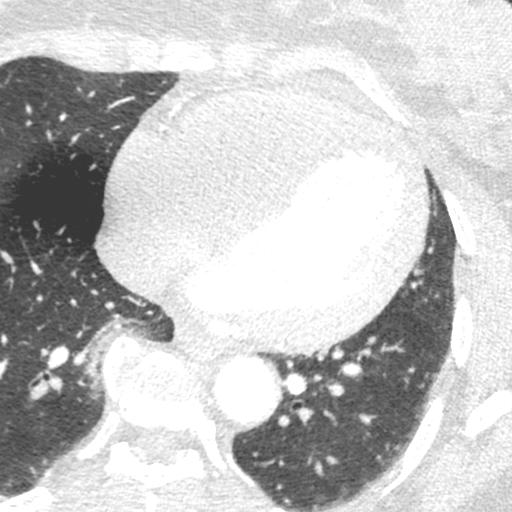
[im 179/268  lung]
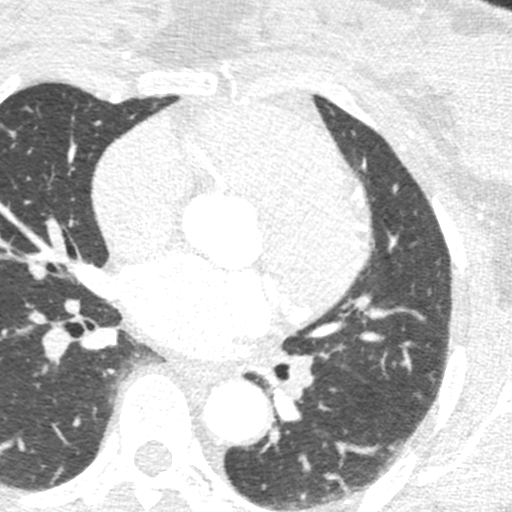

[Series 10: ts syst sharp · axial · 0.39mm/px · z∈[+1210,+1245]mm · 2 of 268 slices shown]
[im 90/268  lung]
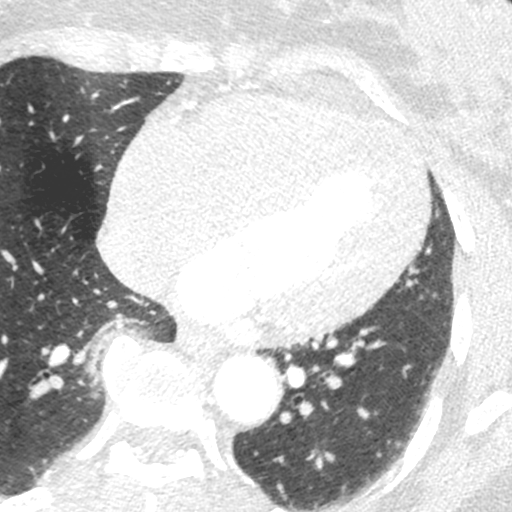
[im 179/268  lung]
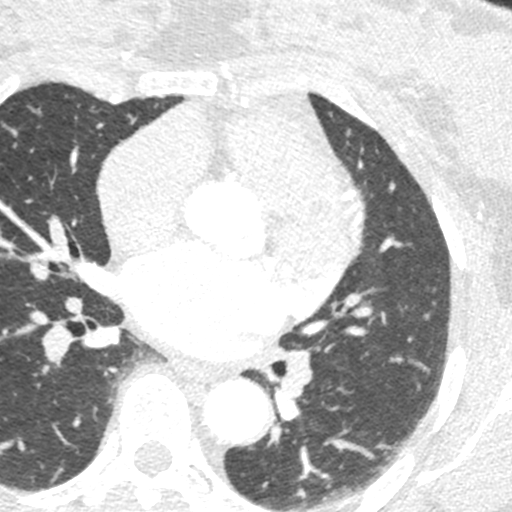

[8 of 20 positions shown; findings below may reference images not displayed]

FINDINGS: Image quality: Average.

Noise artifact is: Moderate. (signal-to-noise).

Coronary Arteries:  Normal coronary origin. Right dominance.

Left main: The left main is a large caliber vessel with a normal
take off from the left coronary cusp that bifurcates to form a left
anterior descending artery and a left circumflex artery. There is no
plaque or stenosis.

Left anterior descending artery: The LAD is patent without evidence
of plaque or stenosis. The LAD gives off small D1 and larger D2. No
plaque noted.

Left circumflex artery: The LCX is non-dominant and patent with no
evidence of plaque or stenosis. The LCX gives off 1 large patent
obtuse marginal branch; note distal vessel not well visualized.

Right coronary artery: The RCA is dominant with normal take off from
the right coronary cusp. There is no evidence of plaque or stenosis.
The RCA terminates as a small PDA. Note, distal right and PDA not
well visualized.

Right Atrium: Right atrial size is within normal limits.

Right Ventricle: The right ventricular cavity is within normal
limits.

Left Atrium: Left atrial size is normal in size with no left atrial
appendage filling defect.

Left Ventricle: The ventricular cavity size is within normal limits.
There are no stigmata of prior infarction. There is no abnormal
filling defect.

Pulmonary arteries: Upper normal in size (29 mm).

Pulmonary veins: Normal pulmonary venous drainage.

Pericardium: Normal thickness with no significant effusion or
calcium present.

Cardiac valves: The aortic valve is trileaflet without significant
calcification. The mitral valve is normal structure without
significant calcification.

Aorta: Normal caliber with no significant disease.

Extra-cardiac findings: See attached radiology report for
non-cardiac structures.
IMPRESSION: 1. Coronary calcium score of 0. This was 0 percentile for age-, sex,
and race-matched controls.

2. Normal coronary origin with right dominance.

3. No evidence of CAD. Note distal RCA, PDA and left circumflex not
well visualized as study technically suboptimal.

RECOMMENDATIONS:
CAD-RADS 0: No evidence of CAD (0%). Consider non-atherosclerotic
causes of chest pain.

EXAM:
OVER-READ INTERPRETATION  CT CHEST

The following report is an over-read performed by radiologist Dr.
over-read does not include interpretation of cardiac or coronary
anatomy or pathology. The coronary CTA interpretation by the
cardiologist is attached.
FINDINGS: The scout images unremarkable.

No mediastinal mass or adenopathy identified.

No pleural effusion identified. No airspace consolidation,
atelectasis or pneumothorax. No suspicious lung nodules identified.

Small hiatal hernia noted. No acute findings within the imaged
portions of the upper abdomen.

No acute or suspicious osseous findings.
IMPRESSION: No significant noncardiac supplemental findings identified.

*** End of Addendum ***
FINDINGS: Image quality: Average.

Noise artifact is: Moderate. (signal-to-noise).

Coronary Arteries:  Normal coronary origin. Right dominance.

Left main: The left main is a large caliber vessel with a normal
take off from the left coronary cusp that bifurcates to form a left
anterior descending artery and a left circumflex artery. There is no
plaque or stenosis.

Left anterior descending artery: The LAD is patent without evidence
of plaque or stenosis. The LAD gives off small D1 and larger D2. No
plaque noted.

Left circumflex artery: The LCX is non-dominant and patent with no
evidence of plaque or stenosis. The LCX gives off 1 large patent
obtuse marginal branch; note distal vessel not well visualized.

Right coronary artery: The RCA is dominant with normal take off from
the right coronary cusp. There is no evidence of plaque or stenosis.
The RCA terminates as a small PDA. Note, distal right and PDA not
well visualized.

Right Atrium: Right atrial size is within normal limits.

Right Ventricle: The right ventricular cavity is within normal
limits.

Left Atrium: Left atrial size is normal in size with no left atrial
appendage filling defect.

Left Ventricle: The ventricular cavity size is within normal limits.
There are no stigmata of prior infarction. There is no abnormal
filling defect.

Pulmonary arteries: Upper normal in size (29 mm).

Pulmonary veins: Normal pulmonary venous drainage.

Pericardium: Normal thickness with no significant effusion or
calcium present.

Cardiac valves: The aortic valve is trileaflet without significant
calcification. The mitral valve is normal structure without
significant calcification.

Aorta: Normal caliber with no significant disease.

Extra-cardiac findings: See attached radiology report for
non-cardiac structures.
IMPRESSION: 1. Coronary calcium score of 0. This was 0 percentile for age-, sex,
and race-matched controls.

2. Normal coronary origin with right dominance.

3. No evidence of CAD. Note distal RCA, PDA and left circumflex not
well visualized as study technically suboptimal.

RECOMMENDATIONS:
CAD-RADS 0: No evidence of CAD (0%). Consider non-atherosclerotic
causes of chest pain.

## 2021-12-25 DIAGNOSIS — R69 Illness, unspecified: Secondary | ICD-10-CM | POA: Diagnosis not present

## 2022-01-17 DIAGNOSIS — O34219 Maternal care for unspecified type scar from previous cesarean delivery: Secondary | ICD-10-CM | POA: Diagnosis not present

## 2022-01-17 DIAGNOSIS — O99283 Endocrine, nutritional and metabolic diseases complicating pregnancy, third trimester: Secondary | ICD-10-CM | POA: Diagnosis not present

## 2022-01-17 DIAGNOSIS — O09819 Supervision of pregnancy resulting from assisted reproductive technology, unspecified trimester: Secondary | ICD-10-CM | POA: Diagnosis not present

## 2022-01-17 DIAGNOSIS — R69 Illness, unspecified: Secondary | ICD-10-CM | POA: Diagnosis not present

## 2022-01-17 DIAGNOSIS — Z3689 Encounter for other specified antenatal screening: Secondary | ICD-10-CM | POA: Diagnosis not present

## 2022-01-17 DIAGNOSIS — O3429 Maternal care due to uterine scar from other previous surgery: Secondary | ICD-10-CM | POA: Diagnosis not present

## 2022-01-17 DIAGNOSIS — O09293 Supervision of pregnancy with other poor reproductive or obstetric history, third trimester: Secondary | ICD-10-CM | POA: Diagnosis not present

## 2022-01-17 DIAGNOSIS — O09523 Supervision of elderly multigravida, third trimester: Secondary | ICD-10-CM | POA: Diagnosis not present

## 2022-01-17 DIAGNOSIS — O99213 Obesity complicating pregnancy, third trimester: Secondary | ICD-10-CM | POA: Diagnosis not present

## 2022-01-17 DIAGNOSIS — O10013 Pre-existing essential hypertension complicating pregnancy, third trimester: Secondary | ICD-10-CM | POA: Diagnosis not present

## 2022-01-18 ENCOUNTER — Ambulatory Visit: Payer: 59 | Attending: Maternal & Fetal Medicine

## 2022-01-18 ENCOUNTER — Ambulatory Visit: Payer: 59 | Admitting: *Deleted

## 2022-01-18 VITALS — BP 115/75 | HR 87

## 2022-01-18 DIAGNOSIS — O3429 Maternal care due to uterine scar from other previous surgery: Secondary | ICD-10-CM | POA: Diagnosis not present

## 2022-01-18 DIAGNOSIS — O403XX0 Polyhydramnios, third trimester, not applicable or unspecified: Secondary | ICD-10-CM | POA: Diagnosis not present

## 2022-01-18 DIAGNOSIS — O99212 Obesity complicating pregnancy, second trimester: Secondary | ICD-10-CM | POA: Diagnosis not present

## 2022-01-18 DIAGNOSIS — O10912 Unspecified pre-existing hypertension complicating pregnancy, second trimester: Secondary | ICD-10-CM | POA: Diagnosis not present

## 2022-01-18 DIAGNOSIS — O34219 Maternal care for unspecified type scar from previous cesarean delivery: Secondary | ICD-10-CM

## 2022-01-18 DIAGNOSIS — E669 Obesity, unspecified: Secondary | ICD-10-CM

## 2022-01-18 DIAGNOSIS — O09522 Supervision of elderly multigravida, second trimester: Secondary | ICD-10-CM | POA: Diagnosis not present

## 2022-01-18 DIAGNOSIS — O99213 Obesity complicating pregnancy, third trimester: Secondary | ICD-10-CM | POA: Diagnosis not present

## 2022-01-18 DIAGNOSIS — O09299 Supervision of pregnancy with other poor reproductive or obstetric history, unspecified trimester: Secondary | ICD-10-CM | POA: Diagnosis not present

## 2022-01-18 DIAGNOSIS — O10913 Unspecified pre-existing hypertension complicating pregnancy, third trimester: Secondary | ICD-10-CM

## 2022-01-18 DIAGNOSIS — D259 Leiomyoma of uterus, unspecified: Secondary | ICD-10-CM

## 2022-01-18 DIAGNOSIS — Z9889 Other specified postprocedural states: Secondary | ICD-10-CM | POA: Diagnosis not present

## 2022-01-18 DIAGNOSIS — E039 Hypothyroidism, unspecified: Secondary | ICD-10-CM | POA: Diagnosis not present

## 2022-01-18 DIAGNOSIS — O283 Abnormal ultrasonic finding on antenatal screening of mother: Secondary | ICD-10-CM | POA: Diagnosis not present

## 2022-01-18 DIAGNOSIS — Z3A28 28 weeks gestation of pregnancy: Secondary | ICD-10-CM | POA: Diagnosis not present

## 2022-01-18 DIAGNOSIS — Z8632 Personal history of gestational diabetes: Secondary | ICD-10-CM | POA: Insufficient documentation

## 2022-01-18 DIAGNOSIS — O10013 Pre-existing essential hypertension complicating pregnancy, third trimester: Secondary | ICD-10-CM

## 2022-01-18 DIAGNOSIS — O3413 Maternal care for benign tumor of corpus uteri, third trimester: Secondary | ICD-10-CM | POA: Diagnosis not present

## 2022-01-18 DIAGNOSIS — O09813 Supervision of pregnancy resulting from assisted reproductive technology, third trimester: Secondary | ICD-10-CM | POA: Diagnosis not present

## 2022-01-18 DIAGNOSIS — O99283 Endocrine, nutritional and metabolic diseases complicating pregnancy, third trimester: Secondary | ICD-10-CM | POA: Diagnosis not present

## 2022-01-18 DIAGNOSIS — O09523 Supervision of elderly multigravida, third trimester: Secondary | ICD-10-CM | POA: Diagnosis not present

## 2022-01-21 ENCOUNTER — Other Ambulatory Visit: Payer: Self-pay | Admitting: *Deleted

## 2022-01-21 DIAGNOSIS — O99213 Obesity complicating pregnancy, third trimester: Secondary | ICD-10-CM

## 2022-01-21 DIAGNOSIS — D259 Leiomyoma of uterus, unspecified: Secondary | ICD-10-CM

## 2022-01-21 DIAGNOSIS — O09299 Supervision of pregnancy with other poor reproductive or obstetric history, unspecified trimester: Secondary | ICD-10-CM

## 2022-01-21 DIAGNOSIS — O09523 Supervision of elderly multigravida, third trimester: Secondary | ICD-10-CM

## 2022-01-21 DIAGNOSIS — O10913 Unspecified pre-existing hypertension complicating pregnancy, third trimester: Secondary | ICD-10-CM

## 2022-01-21 DIAGNOSIS — O34219 Maternal care for unspecified type scar from previous cesarean delivery: Secondary | ICD-10-CM

## 2022-01-24 ENCOUNTER — Other Ambulatory Visit (HOSPITAL_BASED_OUTPATIENT_CLINIC_OR_DEPARTMENT_OTHER): Payer: Self-pay | Admitting: Cardiovascular Disease

## 2022-01-25 ENCOUNTER — Other Ambulatory Visit: Payer: Self-pay | Admitting: Cardiovascular Disease

## 2022-01-25 NOTE — Telephone Encounter (Signed)
Rx request sent to pharmacy.  

## 2022-01-31 DIAGNOSIS — O99213 Obesity complicating pregnancy, third trimester: Secondary | ICD-10-CM | POA: Diagnosis not present

## 2022-01-31 DIAGNOSIS — O403XX0 Polyhydramnios, third trimester, not applicable or unspecified: Secondary | ICD-10-CM | POA: Diagnosis not present

## 2022-01-31 DIAGNOSIS — O09293 Supervision of pregnancy with other poor reproductive or obstetric history, third trimester: Secondary | ICD-10-CM | POA: Diagnosis not present

## 2022-01-31 DIAGNOSIS — O10013 Pre-existing essential hypertension complicating pregnancy, third trimester: Secondary | ICD-10-CM | POA: Diagnosis not present

## 2022-01-31 DIAGNOSIS — O34219 Maternal care for unspecified type scar from previous cesarean delivery: Secondary | ICD-10-CM | POA: Diagnosis not present

## 2022-01-31 DIAGNOSIS — O09523 Supervision of elderly multigravida, third trimester: Secondary | ICD-10-CM | POA: Diagnosis not present

## 2022-01-31 DIAGNOSIS — O09819 Supervision of pregnancy resulting from assisted reproductive technology, unspecified trimester: Secondary | ICD-10-CM | POA: Diagnosis not present

## 2022-01-31 DIAGNOSIS — O3429 Maternal care due to uterine scar from other previous surgery: Secondary | ICD-10-CM | POA: Diagnosis not present

## 2022-01-31 DIAGNOSIS — O99283 Endocrine, nutritional and metabolic diseases complicating pregnancy, third trimester: Secondary | ICD-10-CM | POA: Diagnosis not present

## 2022-02-12 ENCOUNTER — Ambulatory Visit: Payer: 59 | Admitting: *Deleted

## 2022-02-12 ENCOUNTER — Other Ambulatory Visit: Payer: Self-pay | Admitting: Maternal & Fetal Medicine

## 2022-02-12 ENCOUNTER — Ambulatory Visit: Payer: 59 | Attending: Maternal & Fetal Medicine

## 2022-02-12 ENCOUNTER — Encounter: Payer: Self-pay | Admitting: *Deleted

## 2022-02-12 DIAGNOSIS — O283 Abnormal ultrasonic finding on antenatal screening of mother: Secondary | ICD-10-CM | POA: Insufficient documentation

## 2022-02-12 DIAGNOSIS — O09299 Supervision of pregnancy with other poor reproductive or obstetric history, unspecified trimester: Secondary | ICD-10-CM

## 2022-02-12 DIAGNOSIS — O34219 Maternal care for unspecified type scar from previous cesarean delivery: Secondary | ICD-10-CM

## 2022-02-12 DIAGNOSIS — E669 Obesity, unspecified: Secondary | ICD-10-CM | POA: Diagnosis not present

## 2022-02-12 DIAGNOSIS — O10913 Unspecified pre-existing hypertension complicating pregnancy, third trimester: Secondary | ICD-10-CM

## 2022-02-12 DIAGNOSIS — O99213 Obesity complicating pregnancy, third trimester: Secondary | ICD-10-CM | POA: Diagnosis not present

## 2022-02-12 DIAGNOSIS — O09523 Supervision of elderly multigravida, third trimester: Secondary | ICD-10-CM

## 2022-02-12 DIAGNOSIS — O403XX0 Polyhydramnios, third trimester, not applicable or unspecified: Secondary | ICD-10-CM | POA: Diagnosis not present

## 2022-02-12 DIAGNOSIS — E039 Hypothyroidism, unspecified: Secondary | ICD-10-CM | POA: Diagnosis not present

## 2022-02-12 DIAGNOSIS — Z8632 Personal history of gestational diabetes: Secondary | ICD-10-CM | POA: Diagnosis not present

## 2022-02-12 DIAGNOSIS — O1013 Pre-existing hypertensive heart disease complicating the puerperium: Secondary | ICD-10-CM

## 2022-02-12 DIAGNOSIS — O09813 Supervision of pregnancy resulting from assisted reproductive technology, third trimester: Secondary | ICD-10-CM

## 2022-02-12 DIAGNOSIS — D259 Leiomyoma of uterus, unspecified: Secondary | ICD-10-CM

## 2022-02-12 DIAGNOSIS — Z3A31 31 weeks gestation of pregnancy: Secondary | ICD-10-CM

## 2022-02-12 DIAGNOSIS — O09293 Supervision of pregnancy with other poor reproductive or obstetric history, third trimester: Secondary | ICD-10-CM

## 2022-02-12 DIAGNOSIS — O3413 Maternal care for benign tumor of corpus uteri, third trimester: Secondary | ICD-10-CM | POA: Diagnosis not present

## 2022-02-12 DIAGNOSIS — O99283 Endocrine, nutritional and metabolic diseases complicating pregnancy, third trimester: Secondary | ICD-10-CM | POA: Diagnosis not present

## 2022-02-12 DIAGNOSIS — O3429 Maternal care due to uterine scar from other previous surgery: Secondary | ICD-10-CM

## 2022-02-12 NOTE — Procedures (Signed)
April Dyer [birth year] [redacted]w[redacted]d Fetus A Non-Stress Test Interpretation for 02/12/22  Indication: Unsatisfactory BPP  Fetal Heart Rate A Mode: External Baseline Rate (A): 130 bpm Variability: Moderate Accelerations: 10 x 10 Decelerations: None Multiple birth?: No  Uterine Activity Mode: Palpation, Toco Contraction Frequency (min): none Resting Tone Palpated: Relaxed  Interpretation (Fetal Testing) Nonstress Test Interpretation: Reactive Overall Impression: Reassuring for gestational age Comments: Dr. BGertie Exonreviewed tracing

## 2022-02-13 DIAGNOSIS — O09529 Supervision of elderly multigravida, unspecified trimester: Secondary | ICD-10-CM | POA: Diagnosis not present

## 2022-02-13 DIAGNOSIS — O3429 Maternal care due to uterine scar from other previous surgery: Secondary | ICD-10-CM | POA: Diagnosis not present

## 2022-02-13 DIAGNOSIS — O10013 Pre-existing essential hypertension complicating pregnancy, third trimester: Secondary | ICD-10-CM | POA: Diagnosis not present

## 2022-02-13 DIAGNOSIS — O99283 Endocrine, nutritional and metabolic diseases complicating pregnancy, third trimester: Secondary | ICD-10-CM | POA: Diagnosis not present

## 2022-02-13 DIAGNOSIS — O99213 Obesity complicating pregnancy, third trimester: Secondary | ICD-10-CM | POA: Diagnosis not present

## 2022-02-13 DIAGNOSIS — O09819 Supervision of pregnancy resulting from assisted reproductive technology, unspecified trimester: Secondary | ICD-10-CM | POA: Diagnosis not present

## 2022-02-13 DIAGNOSIS — O09523 Supervision of elderly multigravida, third trimester: Secondary | ICD-10-CM | POA: Diagnosis not present

## 2022-02-13 DIAGNOSIS — O34219 Maternal care for unspecified type scar from previous cesarean delivery: Secondary | ICD-10-CM | POA: Diagnosis not present

## 2022-02-20 DIAGNOSIS — R69 Illness, unspecified: Secondary | ICD-10-CM | POA: Diagnosis not present

## 2022-02-21 ENCOUNTER — Ambulatory Visit: Payer: 59 | Attending: Maternal & Fetal Medicine

## 2022-02-21 ENCOUNTER — Ambulatory Visit: Payer: 59 | Admitting: *Deleted

## 2022-02-21 VITALS — BP 136/73 | HR 90

## 2022-02-21 DIAGNOSIS — E669 Obesity, unspecified: Secondary | ICD-10-CM

## 2022-02-21 DIAGNOSIS — Z8632 Personal history of gestational diabetes: Secondary | ICD-10-CM | POA: Insufficient documentation

## 2022-02-21 DIAGNOSIS — O99213 Obesity complicating pregnancy, third trimester: Secondary | ICD-10-CM | POA: Insufficient documentation

## 2022-02-21 DIAGNOSIS — O3413 Maternal care for benign tumor of corpus uteri, third trimester: Secondary | ICD-10-CM | POA: Diagnosis not present

## 2022-02-21 DIAGNOSIS — Z3A33 33 weeks gestation of pregnancy: Secondary | ICD-10-CM | POA: Diagnosis not present

## 2022-02-21 DIAGNOSIS — O09813 Supervision of pregnancy resulting from assisted reproductive technology, third trimester: Secondary | ICD-10-CM | POA: Diagnosis not present

## 2022-02-21 DIAGNOSIS — O09293 Supervision of pregnancy with other poor reproductive or obstetric history, third trimester: Secondary | ICD-10-CM | POA: Diagnosis not present

## 2022-02-21 DIAGNOSIS — O34219 Maternal care for unspecified type scar from previous cesarean delivery: Secondary | ICD-10-CM | POA: Insufficient documentation

## 2022-02-21 DIAGNOSIS — O09299 Supervision of pregnancy with other poor reproductive or obstetric history, unspecified trimester: Secondary | ICD-10-CM | POA: Diagnosis not present

## 2022-02-21 DIAGNOSIS — O3429 Maternal care due to uterine scar from other previous surgery: Secondary | ICD-10-CM

## 2022-02-21 DIAGNOSIS — O403XX0 Polyhydramnios, third trimester, not applicable or unspecified: Secondary | ICD-10-CM

## 2022-02-21 DIAGNOSIS — Z362 Encounter for other antenatal screening follow-up: Secondary | ICD-10-CM | POA: Diagnosis not present

## 2022-02-21 DIAGNOSIS — O09523 Supervision of elderly multigravida, third trimester: Secondary | ICD-10-CM

## 2022-02-21 DIAGNOSIS — O10013 Pre-existing essential hypertension complicating pregnancy, third trimester: Secondary | ICD-10-CM | POA: Diagnosis not present

## 2022-02-21 DIAGNOSIS — O10913 Unspecified pre-existing hypertension complicating pregnancy, third trimester: Secondary | ICD-10-CM | POA: Diagnosis not present

## 2022-02-21 DIAGNOSIS — D259 Leiomyoma of uterus, unspecified: Secondary | ICD-10-CM | POA: Diagnosis not present

## 2022-02-27 DIAGNOSIS — O10013 Pre-existing essential hypertension complicating pregnancy, third trimester: Secondary | ICD-10-CM | POA: Diagnosis not present

## 2022-02-27 DIAGNOSIS — O09293 Supervision of pregnancy with other poor reproductive or obstetric history, third trimester: Secondary | ICD-10-CM | POA: Diagnosis not present

## 2022-02-27 DIAGNOSIS — O99213 Obesity complicating pregnancy, third trimester: Secondary | ICD-10-CM | POA: Diagnosis not present

## 2022-02-27 DIAGNOSIS — O09819 Supervision of pregnancy resulting from assisted reproductive technology, unspecified trimester: Secondary | ICD-10-CM | POA: Diagnosis not present

## 2022-02-27 DIAGNOSIS — O09529 Supervision of elderly multigravida, unspecified trimester: Secondary | ICD-10-CM | POA: Diagnosis not present

## 2022-02-27 DIAGNOSIS — O99283 Endocrine, nutritional and metabolic diseases complicating pregnancy, third trimester: Secondary | ICD-10-CM | POA: Diagnosis not present

## 2022-02-27 DIAGNOSIS — O34219 Maternal care for unspecified type scar from previous cesarean delivery: Secondary | ICD-10-CM | POA: Diagnosis not present

## 2022-02-27 DIAGNOSIS — O09523 Supervision of elderly multigravida, third trimester: Secondary | ICD-10-CM | POA: Diagnosis not present

## 2022-02-27 DIAGNOSIS — O3429 Maternal care due to uterine scar from other previous surgery: Secondary | ICD-10-CM | POA: Diagnosis not present

## 2022-02-27 DIAGNOSIS — O403XX0 Polyhydramnios, third trimester, not applicable or unspecified: Secondary | ICD-10-CM | POA: Diagnosis not present

## 2022-02-28 ENCOUNTER — Ambulatory Visit: Payer: 59 | Attending: Maternal & Fetal Medicine

## 2022-02-28 ENCOUNTER — Ambulatory Visit: Payer: 59 | Admitting: *Deleted

## 2022-02-28 VITALS — BP 128/73 | HR 82

## 2022-02-28 DIAGNOSIS — Z8632 Personal history of gestational diabetes: Secondary | ICD-10-CM | POA: Insufficient documentation

## 2022-02-28 DIAGNOSIS — E039 Hypothyroidism, unspecified: Secondary | ICD-10-CM | POA: Diagnosis not present

## 2022-02-28 DIAGNOSIS — O09523 Supervision of elderly multigravida, third trimester: Secondary | ICD-10-CM

## 2022-02-28 DIAGNOSIS — D259 Leiomyoma of uterus, unspecified: Secondary | ICD-10-CM | POA: Insufficient documentation

## 2022-02-28 DIAGNOSIS — O3429 Maternal care due to uterine scar from other previous surgery: Secondary | ICD-10-CM

## 2022-02-28 DIAGNOSIS — O09813 Supervision of pregnancy resulting from assisted reproductive technology, third trimester: Secondary | ICD-10-CM | POA: Diagnosis not present

## 2022-02-28 DIAGNOSIS — O3413 Maternal care for benign tumor of corpus uteri, third trimester: Secondary | ICD-10-CM | POA: Diagnosis not present

## 2022-02-28 DIAGNOSIS — O99283 Endocrine, nutritional and metabolic diseases complicating pregnancy, third trimester: Secondary | ICD-10-CM

## 2022-02-28 DIAGNOSIS — O10013 Pre-existing essential hypertension complicating pregnancy, third trimester: Secondary | ICD-10-CM

## 2022-02-28 DIAGNOSIS — O09293 Supervision of pregnancy with other poor reproductive or obstetric history, third trimester: Secondary | ICD-10-CM | POA: Insufficient documentation

## 2022-02-28 DIAGNOSIS — O34219 Maternal care for unspecified type scar from previous cesarean delivery: Secondary | ICD-10-CM | POA: Diagnosis not present

## 2022-02-28 DIAGNOSIS — O99213 Obesity complicating pregnancy, third trimester: Secondary | ICD-10-CM | POA: Insufficient documentation

## 2022-02-28 DIAGNOSIS — Z3A34 34 weeks gestation of pregnancy: Secondary | ICD-10-CM | POA: Insufficient documentation

## 2022-02-28 DIAGNOSIS — E669 Obesity, unspecified: Secondary | ICD-10-CM

## 2022-02-28 DIAGNOSIS — O403XX0 Polyhydramnios, third trimester, not applicable or unspecified: Secondary | ICD-10-CM

## 2022-02-28 DIAGNOSIS — O10913 Unspecified pre-existing hypertension complicating pregnancy, third trimester: Secondary | ICD-10-CM | POA: Insufficient documentation

## 2022-03-06 ENCOUNTER — Ambulatory Visit (INDEPENDENT_AMBULATORY_CARE_PROVIDER_SITE_OTHER): Payer: 59 | Admitting: Cardiovascular Disease

## 2022-03-06 ENCOUNTER — Encounter (HOSPITAL_BASED_OUTPATIENT_CLINIC_OR_DEPARTMENT_OTHER): Payer: Self-pay | Admitting: Cardiovascular Disease

## 2022-03-06 DIAGNOSIS — O10913 Unspecified pre-existing hypertension complicating pregnancy, third trimester: Secondary | ICD-10-CM

## 2022-03-06 DIAGNOSIS — I5032 Chronic diastolic (congestive) heart failure: Secondary | ICD-10-CM

## 2022-03-06 DIAGNOSIS — Z3A35 35 weeks gestation of pregnancy: Secondary | ICD-10-CM | POA: Diagnosis not present

## 2022-03-06 MED ORDER — HYDRALAZINE HCL 100 MG PO TABS: 100.0000 mg | ORAL_TABLET | Freq: Two times a day (BID) | ORAL | 3 refills | Status: DC
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
## 2022-03-06 NOTE — Patient Instructions (Addendum)
Medication Instructions:  INCREASE YOUR HYDRALAZINE TO 100 MG TWICE A DAY   Labwork: NONE  Testing/Procedures: NONE  Follow-Up: 03/13/2022 1:30 PM TELEPHONE VISIT  MAKE SURE YOU CHECK YOUR BLOOD PRESSURE JUST BEFORE VISIT AND TWICE A DAY BEFORE VISIT  If you need a refill on your cardiac medications before your next appointment, please call your pharmacy.

## 2022-03-07 ENCOUNTER — Inpatient Hospital Stay (HOSPITAL_BASED_OUTPATIENT_CLINIC_OR_DEPARTMENT_OTHER): Payer: 59

## 2022-03-07 ENCOUNTER — Encounter (HOSPITAL_BASED_OUTPATIENT_CLINIC_OR_DEPARTMENT_OTHER): Payer: Self-pay | Admitting: Cardiovascular Disease

## 2022-03-07 ENCOUNTER — Encounter (HOSPITAL_COMMUNITY): Payer: Self-pay | Admitting: Obstetrics and Gynecology

## 2022-03-07 ENCOUNTER — Inpatient Hospital Stay (HOSPITAL_COMMUNITY)
Admission: AD | Admit: 2022-03-07 | Discharge: 2022-03-07 | Disposition: A | Discharge status: Home / Self Care | Payer: 59 | Attending: Obstetrics and Gynecology | Admitting: Obstetrics and Gynecology

## 2022-03-07 DIAGNOSIS — O10913 Unspecified pre-existing hypertension complicating pregnancy, third trimester: Secondary | ICD-10-CM | POA: Diagnosis not present

## 2022-03-07 DIAGNOSIS — O99280 Endocrine, nutritional and metabolic diseases complicating pregnancy, unspecified trimester: Secondary | ICD-10-CM | POA: Diagnosis not present

## 2022-03-07 DIAGNOSIS — O403XX0 Polyhydramnios, third trimester, not applicable or unspecified: Secondary | ICD-10-CM | POA: Diagnosis not present

## 2022-03-07 DIAGNOSIS — O09293 Supervision of pregnancy with other poor reproductive or obstetric history, third trimester: Secondary | ICD-10-CM

## 2022-03-07 DIAGNOSIS — O09819 Supervision of pregnancy resulting from assisted reproductive technology, unspecified trimester: Secondary | ICD-10-CM | POA: Diagnosis not present

## 2022-03-07 DIAGNOSIS — O3429 Maternal care due to uterine scar from other previous surgery: Secondary | ICD-10-CM

## 2022-03-07 DIAGNOSIS — O09813 Supervision of pregnancy resulting from assisted reproductive technology, third trimester: Secondary | ICD-10-CM | POA: Diagnosis not present

## 2022-03-07 DIAGNOSIS — O10013 Pre-existing essential hypertension complicating pregnancy, third trimester: Secondary | ICD-10-CM

## 2022-03-07 DIAGNOSIS — O09523 Supervision of elderly multigravida, third trimester: Secondary | ICD-10-CM | POA: Diagnosis not present

## 2022-03-07 DIAGNOSIS — E119 Type 2 diabetes mellitus without complications: Secondary | ICD-10-CM | POA: Insufficient documentation

## 2022-03-07 DIAGNOSIS — O3413 Maternal care for benign tumor of corpus uteri, third trimester: Secondary | ICD-10-CM

## 2022-03-07 DIAGNOSIS — O0993 Supervision of high risk pregnancy, unspecified, third trimester: Secondary | ICD-10-CM | POA: Diagnosis not present

## 2022-03-07 DIAGNOSIS — O99213 Obesity complicating pregnancy, third trimester: Secondary | ICD-10-CM | POA: Diagnosis not present

## 2022-03-07 DIAGNOSIS — O368130 Decreased fetal movements, third trimester, not applicable or unspecified: Secondary | ICD-10-CM | POA: Insufficient documentation

## 2022-03-07 DIAGNOSIS — Z3A35 35 weeks gestation of pregnancy: Secondary | ICD-10-CM | POA: Insufficient documentation

## 2022-03-07 DIAGNOSIS — E669 Obesity, unspecified: Secondary | ICD-10-CM

## 2022-03-07 DIAGNOSIS — Z98891 History of uterine scar from previous surgery: Secondary | ICD-10-CM | POA: Diagnosis not present

## 2022-03-07 DIAGNOSIS — O99283 Endocrine, nutritional and metabolic diseases complicating pregnancy, third trimester: Secondary | ICD-10-CM | POA: Insufficient documentation

## 2022-03-07 DIAGNOSIS — O34219 Maternal care for unspecified type scar from previous cesarean delivery: Secondary | ICD-10-CM | POA: Diagnosis not present

## 2022-03-07 DIAGNOSIS — O24113 Pre-existing diabetes mellitus, type 2, in pregnancy, third trimester: Secondary | ICD-10-CM | POA: Diagnosis not present

## 2022-03-07 DIAGNOSIS — Z01818 Encounter for other preprocedural examination: Secondary | ICD-10-CM

## 2022-03-07 DIAGNOSIS — O288 Other abnormal findings on antenatal screening of mother: Secondary | ICD-10-CM | POA: Diagnosis not present

## 2022-03-07 DIAGNOSIS — O09529 Supervision of elderly multigravida, unspecified trimester: Secondary | ICD-10-CM | POA: Diagnosis not present

## 2022-03-07 DIAGNOSIS — E039 Hypothyroidism, unspecified: Secondary | ICD-10-CM | POA: Diagnosis not present

## 2022-03-07 DIAGNOSIS — D259 Leiomyoma of uterus, unspecified: Secondary | ICD-10-CM

## 2022-03-07 LAB — CBC
HCT: 33.3 % — ABNORMAL LOW (ref 36.0–46.0)
Hemoglobin: 11.1 g/dL — ABNORMAL LOW (ref 12.0–15.0)
MCH: 33.1 pg (ref 26.0–34.0)
MCHC: 33.3 g/dL (ref 30.0–36.0)
MCV: 99.4 fL (ref 80.0–100.0)
Platelets: 228 10*3/uL (ref 150–400)
RBC: 3.35 MIL/uL — ABNORMAL LOW (ref 3.87–5.11)
RDW: 13.7 % (ref 11.5–15.5)
WBC: 10.7 10*3/uL — ABNORMAL HIGH (ref 4.0–10.5)
nRBC: 0 % (ref 0.0–0.2)

## 2022-03-07 LAB — COMPREHENSIVE METABOLIC PANEL
ALT: 13 U/L (ref 0–44)
AST: 20 U/L (ref 15–41)
Albumin: 2.7 g/dL — ABNORMAL LOW (ref 3.5–5.0)
Alkaline Phosphatase: 86 U/L (ref 38–126)
Anion gap: 6 (ref 5–15)
BUN: 8 mg/dL (ref 6–20)
CO2: 22 mmol/L (ref 22–32)
Calcium: 8.6 mg/dL — ABNORMAL LOW (ref 8.9–10.3)
Chloride: 110 mmol/L (ref 98–111)
Creatinine, Ser: 0.73 mg/dL (ref 0.44–1.00)
GFR, Estimated: >60 mL/min (ref >60)
Glucose, Bld: 83 mg/dL (ref 70–99)
Potassium: 3.5 mmol/L (ref 3.5–5.1)
Sodium: 138 mmol/L (ref 135–145)
Total Bilirubin: 0.3 mg/dL (ref 0.3–1.2)
Total Protein: 5.9 g/dL — ABNORMAL LOW (ref 6.5–8.1)

## 2022-03-07 LAB — PROTEIN / CREATININE RATIO, URINE
Creatinine, Urine: 48 mg/dL
Total Protein, Urine: <6 mg/dL

## 2022-03-07 MED ORDER — LABETALOL HCL 100 MG PO TABS
300.0000 mg | ORAL_TABLET | Freq: Once | ORAL | Status: AC
  Administered 2022-03-07: 300 mg via ORAL
  Filled 2022-03-07: qty 3

## 2022-03-07 MED ORDER — HYDRALAZINE HCL 50 MG PO TABS
100.0000 mg | ORAL_TABLET | Freq: Once | ORAL | Status: AC
  Administered 2022-03-07: 100 mg via ORAL
  Filled 2022-03-07: qty 2

## 2022-03-07 NOTE — Assessment & Plan Note (Addendum)
Blood pressure uncontrolled as above.  She has LE edema but no shortness of breath or orthopnea.  Titrate antihypertensives as above.  Lab is closed.  Discussed with her OB/GYN who will get appropriate labs tomorrow.

## 2022-03-07 NOTE — Discharge Instructions (Signed)
Daily kick counts
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
## 2022-03-07 NOTE — Assessment & Plan Note (Signed)
Blood pressures are above goal today both initially and on repeat.  Her blood pressure has been higher over the last 2 weeks.  We will increase her hydralazine to 100 mg twice daily.  Continue with labetalol 600 mg twice daily

## 2022-03-07 NOTE — MAU Note (Signed)
.  April Dyer is a 51 y.o. at 34w1dhere in MAU reporting: sent from office for decreased fetal movement and elevated b/p. Pt has chronic HTN. Meds regiment changed. OB wants to check labs. Denies any vag bleeding  or leaking denies headache but does report seeing spots at times.  Onset of complaint: 1 week Pain score: 0 Vitals:   03/07/22 1804  BP: (!) 142/76  Pulse: 84  Resp: 18  Temp: 98.2 F (36.8 C)     FHT:138 Lab orders placed from triage:  [id number]

## 2022-03-08 ENCOUNTER — Ambulatory Visit: Payer: 59 | Admitting: *Deleted

## 2022-03-08 ENCOUNTER — Ambulatory Visit: Payer: 59 | Attending: Maternal & Fetal Medicine

## 2022-03-08 ENCOUNTER — Encounter (HOSPITAL_COMMUNITY): Payer: Self-pay

## 2022-03-08 VITALS — BP 137/73 | HR 91

## 2022-03-08 DIAGNOSIS — O09813 Supervision of pregnancy resulting from assisted reproductive technology, third trimester: Secondary | ICD-10-CM

## 2022-03-08 DIAGNOSIS — O99283 Endocrine, nutritional and metabolic diseases complicating pregnancy, third trimester: Secondary | ICD-10-CM | POA: Insufficient documentation

## 2022-03-08 DIAGNOSIS — O34219 Maternal care for unspecified type scar from previous cesarean delivery: Secondary | ICD-10-CM | POA: Diagnosis not present

## 2022-03-08 DIAGNOSIS — D259 Leiomyoma of uterus, unspecified: Secondary | ICD-10-CM | POA: Diagnosis not present

## 2022-03-08 DIAGNOSIS — O09293 Supervision of pregnancy with other poor reproductive or obstetric history, third trimester: Secondary | ICD-10-CM | POA: Diagnosis not present

## 2022-03-08 DIAGNOSIS — Z8632 Personal history of gestational diabetes: Secondary | ICD-10-CM | POA: Diagnosis not present

## 2022-03-08 DIAGNOSIS — Z3A35 35 weeks gestation of pregnancy: Secondary | ICD-10-CM | POA: Insufficient documentation

## 2022-03-08 DIAGNOSIS — O09299 Supervision of pregnancy with other poor reproductive or obstetric history, unspecified trimester: Secondary | ICD-10-CM

## 2022-03-08 DIAGNOSIS — O99213 Obesity complicating pregnancy, third trimester: Secondary | ICD-10-CM

## 2022-03-08 DIAGNOSIS — O3413 Maternal care for benign tumor of corpus uteri, third trimester: Secondary | ICD-10-CM | POA: Diagnosis not present

## 2022-03-08 DIAGNOSIS — Z3685 Encounter for antenatal screening for Streptococcus B: Secondary | ICD-10-CM | POA: Diagnosis not present

## 2022-03-08 DIAGNOSIS — O10013 Pre-existing essential hypertension complicating pregnancy, third trimester: Secondary | ICD-10-CM | POA: Diagnosis not present

## 2022-03-08 DIAGNOSIS — O3429 Maternal care due to uterine scar from other previous surgery: Secondary | ICD-10-CM | POA: Diagnosis not present

## 2022-03-08 DIAGNOSIS — E039 Hypothyroidism, unspecified: Secondary | ICD-10-CM | POA: Insufficient documentation

## 2022-03-08 DIAGNOSIS — E669 Obesity, unspecified: Secondary | ICD-10-CM | POA: Diagnosis not present

## 2022-03-08 DIAGNOSIS — O10913 Unspecified pre-existing hypertension complicating pregnancy, third trimester: Secondary | ICD-10-CM

## 2022-03-08 DIAGNOSIS — O09523 Supervision of elderly multigravida, third trimester: Secondary | ICD-10-CM

## 2022-03-08 DIAGNOSIS — O09529 Supervision of elderly multigravida, unspecified trimester: Secondary | ICD-10-CM | POA: Diagnosis not present
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
## 2022-03-13 ENCOUNTER — Telehealth (HOSPITAL_BASED_OUTPATIENT_CLINIC_OR_DEPARTMENT_OTHER): Payer: 59 | Admitting: Cardiovascular Disease

## 2022-03-13 ENCOUNTER — Encounter (HOSPITAL_BASED_OUTPATIENT_CLINIC_OR_DEPARTMENT_OTHER): Payer: Self-pay | Admitting: Cardiovascular Disease

## 2022-03-13 ENCOUNTER — Ambulatory Visit (INDEPENDENT_AMBULATORY_CARE_PROVIDER_SITE_OTHER): Payer: 59 | Admitting: Cardiovascular Disease

## 2022-03-13 VITALS — BP 152/78 | HR 78 | Ht 68.0 in | Wt 246.0 lb

## 2022-03-13 DIAGNOSIS — O10913 Unspecified pre-existing hypertension complicating pregnancy, third trimester: Secondary | ICD-10-CM

## 2022-03-13 DIAGNOSIS — Z3A36 36 weeks gestation of pregnancy: Secondary | ICD-10-CM | POA: Diagnosis not present

## 2022-03-13 DIAGNOSIS — R69 Illness, unspecified: Secondary | ICD-10-CM | POA: Diagnosis not present

## 2022-03-13 DIAGNOSIS — I5032 Chronic diastolic (congestive) heart failure: Secondary | ICD-10-CM

## 2022-03-13 NOTE — Addendum Note (Signed)
Addended by: Alvina Filbert B on: 03/13/2022 09:28 AM   Modules accepted: Orders

## 2022-03-13 NOTE — Assessment & Plan Note (Signed)
Blood pressure has been better controlled but still mostly above goal.  She is feeling better and less stressed, which is helping.  We will add an afternoon dose of hydralazine '50mg'$ .  Continue '100mg'$  in the am and PM.  Continue amlodipine and nifedipine.  Labs have been stable and no lab evidence of pre-eclampsia or HELLP  Continue aspirin.

## 2022-03-13 NOTE — Patient Instructions (Addendum)
Medication Instructions:  START HYDRALAZINE 50 MG IN THE AFTERNOON WHEN YOU TAKE YOUR AMLODIPINE  CONTINUE THE 100 MG IN THE MORNING AND EVENING   Labwork: NONE  Testing/Procedures: NONE  Follow-Up: 02/10/2023 11:20 AM WITH Urban Gibson W NP

## 2022-03-13 NOTE — Assessment & Plan Note (Addendum)
Breathing and volume status have been stable.  Blood pressure management as above.

## 2022-03-14 DIAGNOSIS — O99213 Obesity complicating pregnancy, third trimester: Secondary | ICD-10-CM | POA: Diagnosis not present

## 2022-03-14 DIAGNOSIS — O99820 Streptococcus B carrier state complicating pregnancy: Secondary | ICD-10-CM | POA: Diagnosis not present

## 2022-03-14 DIAGNOSIS — O09523 Supervision of elderly multigravida, third trimester: Secondary | ICD-10-CM | POA: Diagnosis not present

## 2022-03-14 DIAGNOSIS — O10013 Pre-existing essential hypertension complicating pregnancy, third trimester: Secondary | ICD-10-CM | POA: Diagnosis not present

## 2022-03-14 DIAGNOSIS — O99283 Endocrine, nutritional and metabolic diseases complicating pregnancy, third trimester: Secondary | ICD-10-CM | POA: Diagnosis not present

## 2022-03-14 DIAGNOSIS — O3429 Maternal care due to uterine scar from other previous surgery: Secondary | ICD-10-CM | POA: Diagnosis not present

## 2022-03-14 DIAGNOSIS — O09529 Supervision of elderly multigravida, unspecified trimester: Secondary | ICD-10-CM | POA: Diagnosis not present

## 2022-03-14 DIAGNOSIS — O34219 Maternal care for unspecified type scar from previous cesarean delivery: Secondary | ICD-10-CM | POA: Diagnosis not present

## 2022-03-14 DIAGNOSIS — O09819 Supervision of pregnancy resulting from assisted reproductive technology, unspecified trimester: Secondary | ICD-10-CM | POA: Diagnosis not present

## 2022-03-15 ENCOUNTER — Ambulatory Visit: Payer: 59 | Admitting: *Deleted

## 2022-03-15 ENCOUNTER — Ambulatory Visit: Payer: 59 | Attending: Maternal & Fetal Medicine

## 2022-03-15 VITALS — BP 145/71 | HR 102

## 2022-03-15 DIAGNOSIS — O10013 Pre-existing essential hypertension complicating pregnancy, third trimester: Secondary | ICD-10-CM

## 2022-03-15 DIAGNOSIS — O34219 Maternal care for unspecified type scar from previous cesarean delivery: Secondary | ICD-10-CM | POA: Insufficient documentation

## 2022-03-15 DIAGNOSIS — E039 Hypothyroidism, unspecified: Secondary | ICD-10-CM | POA: Diagnosis not present

## 2022-03-15 DIAGNOSIS — O99213 Obesity complicating pregnancy, third trimester: Secondary | ICD-10-CM | POA: Diagnosis not present

## 2022-03-15 DIAGNOSIS — E669 Obesity, unspecified: Secondary | ICD-10-CM | POA: Diagnosis not present

## 2022-03-15 DIAGNOSIS — O99283 Endocrine, nutritional and metabolic diseases complicating pregnancy, third trimester: Secondary | ICD-10-CM | POA: Insufficient documentation

## 2022-03-15 DIAGNOSIS — O09523 Supervision of elderly multigravida, third trimester: Secondary | ICD-10-CM

## 2022-03-15 DIAGNOSIS — Z8632 Personal history of gestational diabetes: Secondary | ICD-10-CM | POA: Insufficient documentation

## 2022-03-15 DIAGNOSIS — Z3A36 36 weeks gestation of pregnancy: Secondary | ICD-10-CM | POA: Diagnosis not present

## 2022-03-15 DIAGNOSIS — O09299 Supervision of pregnancy with other poor reproductive or obstetric history, unspecified trimester: Secondary | ICD-10-CM

## 2022-03-15 DIAGNOSIS — D259 Leiomyoma of uterus, unspecified: Secondary | ICD-10-CM | POA: Insufficient documentation

## 2022-03-15 DIAGNOSIS — O3429 Maternal care due to uterine scar from other previous surgery: Secondary | ICD-10-CM | POA: Diagnosis not present

## 2022-03-15 DIAGNOSIS — O10913 Unspecified pre-existing hypertension complicating pregnancy, third trimester: Secondary | ICD-10-CM | POA: Insufficient documentation

## 2022-03-15 DIAGNOSIS — O3413 Maternal care for benign tumor of corpus uteri, third trimester: Secondary | ICD-10-CM | POA: Insufficient documentation

## 2022-03-15 DIAGNOSIS — O09813 Supervision of pregnancy resulting from assisted reproductive technology, third trimester: Secondary | ICD-10-CM

## 2022-03-15 DIAGNOSIS — O09293 Supervision of pregnancy with other poor reproductive or obstetric history, third trimester: Secondary | ICD-10-CM

## 2022-03-19 ENCOUNTER — Encounter (HOSPITAL_COMMUNITY)
Admission: RE | Admit: 2022-03-19 | Discharge: 2022-03-19 | Disposition: A | Discharge status: Home / Self Care | Payer: 59 | Source: Ambulatory Visit | Attending: Obstetrics and Gynecology | Admitting: Obstetrics and Gynecology

## 2022-03-19 DIAGNOSIS — Z01812 Encounter for preprocedural laboratory examination: Secondary | ICD-10-CM | POA: Insufficient documentation

## 2022-03-19 DIAGNOSIS — O10913 Unspecified pre-existing hypertension complicating pregnancy, third trimester: Secondary | ICD-10-CM | POA: Insufficient documentation

## 2022-03-19 DIAGNOSIS — Z3A00 Weeks of gestation of pregnancy not specified: Secondary | ICD-10-CM | POA: Insufficient documentation

## 2022-03-19 LAB — COMPREHENSIVE METABOLIC PANEL
ALT: 16 U/L (ref 0–44)
AST: 27 U/L (ref 15–41)
Albumin: 2.8 g/dL — ABNORMAL LOW (ref 3.5–5.0)
Alkaline Phosphatase: 107 U/L (ref 38–126)
Anion gap: 6 (ref 5–15)
BUN: 9 mg/dL (ref 6–20)
CO2: 22 mmol/L (ref 22–32)
Calcium: 8.4 mg/dL — ABNORMAL LOW (ref 8.9–10.3)
Chloride: 110 mmol/L (ref 98–111)
Creatinine, Ser: 0.79 mg/dL (ref 0.44–1.00)
GFR, Estimated: >60 mL/min (ref >60)
Glucose, Bld: 92 mg/dL (ref 70–99)
Potassium: 4 mmol/L (ref 3.5–5.1)
Sodium: 138 mmol/L (ref 135–145)
Total Bilirubin: 0.6 mg/dL (ref 0.3–1.2)
Total Protein: 5.8 g/dL — ABNORMAL LOW (ref 6.5–8.1)

## 2022-03-19 LAB — CBC
HCT: 33.7 % — ABNORMAL LOW (ref 36.0–46.0)
Hemoglobin: 10.9 g/dL — ABNORMAL LOW (ref 12.0–15.0)
MCH: 32.3 pg (ref 26.0–34.0)
MCHC: 32.3 g/dL (ref 30.0–36.0)
MCV: 100 fL (ref 80.0–100.0)
Platelets: 212 10*3/uL (ref 150–400)
RBC: 3.37 MIL/uL — ABNORMAL LOW (ref 3.87–5.11)
RDW: 13.7 % (ref 11.5–15.5)
WBC: 10.3 10*3/uL (ref 4.0–10.5)
nRBC: 0 % (ref 0.0–0.2)

## 2022-03-19 LAB — TYPE AND SCREEN
ABO/RH(D): O POS
Antibody Screen: NEGATIVE

## 2022-03-19 NOTE — Patient Instructions (Addendum)
April Dyer  03/19/2022   Your procedure is scheduled on:  03/20/2022  Arrive at 4:00 PM at Entrance C on [address] at [facility]  and [address]. You are invited to use the FREE valet parking or use the Visitor's parking deck.  Pick up the phone at the desk and dial [phone].  Call this number if you have problems the morning of surgery: [phone]  Remember:   Do not eat food:8 hours prior to arrival 0800.  Do not drink clear liquids: (4 Hours before arrival) 4 horas ante llegada.  Take these medicines the morning of surgery with A SIP OF WATER:  Take norvasc, hydralazine, labetalol and levothyroxine as prescribed.  Bring your inhaler with you.   Do not wear jewelry, make-up or nail polish.  Do not wear lotions, powders, or perfumes. Do not wear deodorant.  Do not shave 48 hours prior to surgery.  Do not bring valuables to the hospital.  [facility] is not   responsible for any belongings or valuables brought to the hospital.  Contacts, dentures or bridgework may not be worn into surgery.  Leave suitcase in the car. After surgery it may be brought to your room.  For patients admitted to the hospital, checkout time is 11:00 AM the day of              discharge.      Please read over the following fact sheets that you were given:     Preparing for Surgery

## 2022-03-20 ENCOUNTER — Inpatient Hospital Stay (HOSPITAL_COMMUNITY)
Admission: AD | Admit: 2022-03-20 | Discharge: 2022-03-24 | DRG: 787 | Disposition: A | Discharge status: Home / Self Care | Payer: 59 | Attending: Obstetrics and Gynecology | Admitting: Obstetrics and Gynecology

## 2022-03-20 ENCOUNTER — Encounter (HOSPITAL_COMMUNITY)
Admission: AD | Disposition: A | Discharge status: Home / Self Care | Payer: Self-pay | Source: Home / Self Care | Attending: Obstetrics and Gynecology

## 2022-03-20 ENCOUNTER — Encounter (HOSPITAL_COMMUNITY): Payer: Self-pay | Admitting: Obstetrics and Gynecology

## 2022-03-20 ENCOUNTER — Inpatient Hospital Stay (HOSPITAL_COMMUNITY): Payer: 59 | Admitting: Certified Registered Nurse Anesthetist

## 2022-03-20 ENCOUNTER — Other Ambulatory Visit: Payer: Self-pay

## 2022-03-20 DIAGNOSIS — O99284 Endocrine, nutritional and metabolic diseases complicating childbirth: Secondary | ICD-10-CM | POA: Diagnosis present

## 2022-03-20 DIAGNOSIS — Z3A37 37 weeks gestation of pregnancy: Secondary | ICD-10-CM

## 2022-03-20 DIAGNOSIS — O99214 Obesity complicating childbirth: Secondary | ICD-10-CM | POA: Diagnosis not present

## 2022-03-20 DIAGNOSIS — O09523 Supervision of elderly multigravida, third trimester: Secondary | ICD-10-CM | POA: Diagnosis not present

## 2022-03-20 DIAGNOSIS — O3413 Maternal care for benign tumor of corpus uteri, third trimester: Secondary | ICD-10-CM | POA: Diagnosis not present

## 2022-03-20 DIAGNOSIS — O9962 Diseases of the digestive system complicating childbirth: Secondary | ICD-10-CM | POA: Diagnosis present

## 2022-03-20 DIAGNOSIS — E039 Hypothyroidism, unspecified: Secondary | ICD-10-CM | POA: Diagnosis present

## 2022-03-20 DIAGNOSIS — O326XX0 Maternal care for compound presentation, not applicable or unspecified: Secondary | ICD-10-CM | POA: Diagnosis present

## 2022-03-20 DIAGNOSIS — O99824 Streptococcus B carrier state complicating childbirth: Secondary | ICD-10-CM | POA: Diagnosis not present

## 2022-03-20 DIAGNOSIS — O10913 Unspecified pre-existing hypertension complicating pregnancy, third trimester: Principal | ICD-10-CM

## 2022-03-20 DIAGNOSIS — O326XX1 Maternal care for compound presentation, fetus 1: Secondary | ICD-10-CM | POA: Diagnosis not present

## 2022-03-20 DIAGNOSIS — O1002 Pre-existing essential hypertension complicating childbirth: Secondary | ICD-10-CM | POA: Diagnosis not present

## 2022-03-20 DIAGNOSIS — Z8673 Personal history of transient ischemic attack (TIA), and cerebral infarction without residual deficits: Secondary | ICD-10-CM

## 2022-03-20 DIAGNOSIS — O34219 Maternal care for unspecified type scar from previous cesarean delivery: Secondary | ICD-10-CM | POA: Diagnosis not present

## 2022-03-20 DIAGNOSIS — D259 Leiomyoma of uterus, unspecified: Secondary | ICD-10-CM | POA: Diagnosis present

## 2022-03-20 DIAGNOSIS — O1092 Unspecified pre-existing hypertension complicating childbirth: Secondary | ICD-10-CM

## 2022-03-20 DIAGNOSIS — Z7982 Long term (current) use of aspirin: Secondary | ICD-10-CM | POA: Diagnosis not present

## 2022-03-20 DIAGNOSIS — K219 Gastro-esophageal reflux disease without esophagitis: Secondary | ICD-10-CM | POA: Diagnosis present

## 2022-03-20 DIAGNOSIS — O3429 Maternal care due to uterine scar from other previous surgery: Secondary | ICD-10-CM | POA: Diagnosis present

## 2022-03-20 DIAGNOSIS — Z8632 Personal history of gestational diabetes: Secondary | ICD-10-CM

## 2022-03-20 DIAGNOSIS — O322XX0 Maternal care for transverse and oblique lie, not applicable or unspecified: Secondary | ICD-10-CM | POA: Diagnosis present

## 2022-03-20 DIAGNOSIS — O34211 Maternal care for low transverse scar from previous cesarean delivery: Secondary | ICD-10-CM | POA: Diagnosis not present

## 2022-03-20 DIAGNOSIS — O99820 Streptococcus B carrier state complicating pregnancy: Secondary | ICD-10-CM | POA: Diagnosis not present

## 2022-03-20 DIAGNOSIS — O09819 Supervision of pregnancy resulting from assisted reproductive technology, unspecified trimester: Secondary | ICD-10-CM | POA: Diagnosis not present

## 2022-03-20 LAB — PROTEIN / CREATININE RATIO, URINE
Creatinine, Urine: 247 mg/dL
Protein Creatinine Ratio: 0.12 mg/mg{Cre} (ref 0.00–0.15)
Total Protein, Urine: 30 mg/dL

## 2022-03-20 LAB — RPR: RPR Ser Ql: NONREACTIVE

## 2022-03-20 SURGERY — Surgical Case
Anesthesia: Spinal

## 2022-03-20 SURGERY — Surgical Case
Anesthesia: Regional

## 2022-03-20 MED ORDER — HYDRALAZINE HCL 50 MG PO TABS
100.0000 mg | ORAL_TABLET | Freq: Two times a day (BID) | ORAL | Status: DC
  Administered 2022-03-21 – 2022-03-24 (×7): 100 mg via ORAL
  Filled 2022-03-20 (×9): qty 2

## 2022-03-20 MED ORDER — MEPERIDINE HCL 25 MG/ML IJ SOLN: 6.2500 mg | INTRAMUSCULAR | Status: DC | PRN

## 2022-03-20 MED ORDER — ONDANSETRON HCL 4 MG/2ML IJ SOLN
4.0000 mg | Freq: Three times a day (TID) | INTRAMUSCULAR | Status: DC | PRN
  Administered 2022-03-20: 4 mg via INTRAVENOUS
  Filled 2022-03-20: qty 2

## 2022-03-20 MED ORDER — SCOPOLAMINE 1 MG/3DAYS TD PT72
MEDICATED_PATCH | TRANSDERMAL | Status: AC
  Filled 2022-03-20: qty 1

## 2022-03-20 MED ORDER — SIMETHICONE 80 MG PO CHEW: 80.0000 mg | CHEWABLE_TABLET | ORAL | Status: DC | PRN

## 2022-03-20 MED ORDER — LEVOTHYROXINE SODIUM 75 MCG PO TABS
175.0000 ug | ORAL_TABLET | Freq: Every day | ORAL | Status: DC
  Administered 2022-03-21 – 2022-03-24 (×4): 175 ug via ORAL
  Filled 2022-03-20 (×4): qty 1

## 2022-03-20 MED ORDER — LACTATED RINGERS IV SOLN: INTRAVENOUS | Status: DC

## 2022-03-20 MED ORDER — BUPIVACAINE HCL (PF) 0.25 % IJ SOLN
INTRAMUSCULAR | Status: DC | PRN
  Administered 2022-03-20: 10 mL

## 2022-03-20 MED ORDER — SOD CITRATE-CITRIC ACID 500-334 MG/5ML PO SOLN
30.0000 mL | ORAL | Status: AC
  Administered 2022-03-20: 30 mL via ORAL

## 2022-03-20 MED ORDER — LABETALOL HCL 200 MG PO TABS
300.0000 mg | ORAL_TABLET | Freq: Two times a day (BID) | ORAL | Status: DC
  Administered 2022-03-20 – 2022-03-24 (×8): 300 mg via ORAL
  Filled 2022-03-20 (×9): qty 1

## 2022-03-20 MED ORDER — FENTANYL CITRATE (PF) 100 MCG/2ML IJ SOLN
INTRAMUSCULAR | Status: DC | PRN
  Administered 2022-03-20: 15 ug via INTRATHECAL

## 2022-03-20 MED ORDER — FENTANYL CITRATE (PF) 100 MCG/2ML IJ SOLN
INTRAMUSCULAR | Status: AC
  Filled 2022-03-20: qty 2

## 2022-03-20 MED ORDER — DIPHENHYDRAMINE HCL 25 MG PO CAPS: 25.0000 mg | ORAL_CAPSULE | Freq: Four times a day (QID) | ORAL | Status: DC | PRN

## 2022-03-20 MED ORDER — CEFAZOLIN SODIUM-DEXTROSE 2-4 GM/100ML-% IV SOLN
INTRAVENOUS | Status: AC
  Filled 2022-03-20: qty 100

## 2022-03-20 MED ORDER — WITCH HAZEL-GLYCERIN EX PADS: 1.0000 | MEDICATED_PAD | CUTANEOUS | Status: DC | PRN

## 2022-03-20 MED ORDER — OXYCODONE HCL 5 MG/5ML PO SOLN: 5.0000 mg | Freq: Once | ORAL | Status: DC | PRN

## 2022-03-20 MED ORDER — DEXAMETHASONE SODIUM PHOSPHATE 4 MG/ML IJ SOLN
INTRAMUSCULAR | Status: AC
  Filled 2022-03-20: qty 1

## 2022-03-20 MED ORDER — STERILE WATER FOR IRRIGATION IR SOLN
Status: DC | PRN
  Administered 2022-03-20: 1000 mL

## 2022-03-20 MED ORDER — PHENYLEPHRINE HCL-NACL 20-0.9 MG/250ML-% IV SOLN
INTRAVENOUS | Status: DC | PRN
  Administered 2022-03-20 (×2): 60 ug/min via INTRAVENOUS

## 2022-03-20 MED ORDER — ACETAMINOPHEN 500 MG PO TABS
1000.0000 mg | ORAL_TABLET | Freq: Four times a day (QID) | ORAL | Status: DC
  Administered 2022-03-21 – 2022-03-24 (×13): 1000 mg via ORAL
  Filled 2022-03-20 (×15): qty 2

## 2022-03-20 MED ORDER — SOD CITRATE-CITRIC ACID 500-334 MG/5ML PO SOLN
ORAL | Status: AC
  Filled 2022-03-20: qty 30

## 2022-03-20 MED ORDER — BUPIVACAINE HCL (PF) 0.25 % IJ SOLN
INTRAMUSCULAR | Status: AC
  Filled 2022-03-20: qty 30

## 2022-03-20 MED ORDER — DIPHENHYDRAMINE HCL 50 MG/ML IJ SOLN: 12.5000 mg | INTRAMUSCULAR | Status: DC | PRN

## 2022-03-20 MED ORDER — ACETAMINOPHEN 10 MG/ML IV SOLN
INTRAVENOUS | Status: DC | PRN
  Administered 2022-03-20: 1000 mg via INTRAVENOUS

## 2022-03-20 MED ORDER — SODIUM CHLORIDE 0.9% FLUSH: 3.0000 mL | INTRAVENOUS | Status: DC | PRN

## 2022-03-20 MED ORDER — PROMETHAZINE HCL 25 MG/ML IJ SOLN: 6.2500 mg | INTRAMUSCULAR | Status: DC | PRN

## 2022-03-20 MED ORDER — ONDANSETRON HCL 4 MG/2ML IJ SOLN
INTRAMUSCULAR | Status: AC
  Filled 2022-03-20: qty 2

## 2022-03-20 MED ORDER — CEFAZOLIN SODIUM-DEXTROSE 2-4 GM/100ML-% IV SOLN
2.0000 g | INTRAVENOUS | Status: AC
  Administered 2022-03-20: 2 g via INTRAVENOUS

## 2022-03-20 MED ORDER — SIMETHICONE 80 MG PO CHEW
80.0000 mg | CHEWABLE_TABLET | Freq: Three times a day (TID) | ORAL | Status: DC
  Administered 2022-03-21 – 2022-03-24 (×9): 80 mg via ORAL
  Filled 2022-03-20 (×9): qty 1

## 2022-03-20 MED ORDER — DIPHENHYDRAMINE HCL 25 MG PO CAPS
25.0000 mg | ORAL_CAPSULE | ORAL | Status: DC | PRN
  Administered 2022-03-21: 25 mg via ORAL
  Filled 2022-03-20: qty 1

## 2022-03-20 MED ORDER — MORPHINE SULFATE (PF) 0.5 MG/ML IJ SOLN
INTRAMUSCULAR | Status: DC | PRN
  Administered 2022-03-20: .15 mg via INTRATHECAL

## 2022-03-20 MED ORDER — HYDROMORPHONE HCL 1 MG/ML IJ SOLN: 0.2500 mg | INTRAMUSCULAR | Status: DC | PRN

## 2022-03-20 MED ORDER — MENTHOL 3 MG MT LOZG: 1.0000 | LOZENGE | OROMUCOSAL | Status: DC | PRN

## 2022-03-20 MED ORDER — POVIDONE-IODINE 10 % EX SWAB: 2.0000 | Freq: Once | CUTANEOUS | Status: DC

## 2022-03-20 MED ORDER — DIBUCAINE (PERIANAL) 1 % EX OINT: 1.0000 | TOPICAL_OINTMENT | CUTANEOUS | Status: DC | PRN

## 2022-03-20 MED ORDER — NALOXONE HCL 0.4 MG/ML IJ SOLN: 0.4000 mg | INTRAMUSCULAR | Status: DC | PRN

## 2022-03-20 MED ORDER — OXYCODONE HCL 5 MG PO TABS: 5.0000 mg | ORAL_TABLET | ORAL | Status: DC | PRN

## 2022-03-20 MED ORDER — ONDANSETRON HCL 4 MG/2ML IJ SOLN
INTRAMUSCULAR | Status: DC | PRN
  Administered 2022-03-20: 4 mg via INTRAVENOUS

## 2022-03-20 MED ORDER — OXYCODONE HCL 5 MG PO TABS: 5.0000 mg | ORAL_TABLET | Freq: Once | ORAL | Status: DC | PRN

## 2022-03-20 MED ORDER — DEXAMETHASONE SODIUM PHOSPHATE 4 MG/ML IJ SOLN
INTRAMUSCULAR | Status: DC | PRN
  Administered 2022-03-20: 4 mg via INTRAVENOUS

## 2022-03-20 MED ORDER — OXYTOCIN-SODIUM CHLORIDE 30-0.9 UT/500ML-% IV SOLN
2.5000 [IU]/h | INTRAVENOUS | Status: AC
  Administered 2022-03-20: 2.5 [IU]/h via INTRAVENOUS
  Filled 2022-03-20: qty 500

## 2022-03-20 MED ORDER — PRENATAL MULTIVITAMIN CH
1.0000 | ORAL_TABLET | Freq: Every day | ORAL | Status: DC
  Administered 2022-03-21 – 2022-03-24 (×4): 1 via ORAL
  Filled 2022-03-20 (×4): qty 1

## 2022-03-20 MED ORDER — COCONUT OIL OIL: 1.0000 | TOPICAL_OIL | Status: DC | PRN

## 2022-03-20 MED ORDER — OXYTOCIN-SODIUM CHLORIDE 30-0.9 UT/500ML-% IV SOLN
INTRAVENOUS | Status: DC | PRN
  Administered 2022-03-20: 30 [IU] via INTRAVENOUS

## 2022-03-20 MED ORDER — HYDRALAZINE HCL 50 MG PO TABS: 50.0000 mg | ORAL_TABLET | ORAL | Status: DC

## 2022-03-20 MED ORDER — NALOXONE HCL 4 MG/10ML IJ SOLN: 1.0000 ug/kg/h | INTRAVENOUS | Status: DC | PRN

## 2022-03-20 MED ORDER — SENNOSIDES-DOCUSATE SODIUM 8.6-50 MG PO TABS
2.0000 | ORAL_TABLET | ORAL | Status: DC
  Administered 2022-03-21 – 2022-03-24 (×4): 2 via ORAL
  Filled 2022-03-20 (×4): qty 2

## 2022-03-20 MED ORDER — AMLODIPINE BESYLATE 5 MG PO TABS
10.0000 mg | ORAL_TABLET | Freq: Every day | ORAL | Status: DC
  Filled 2022-03-20: qty 2

## 2022-03-20 MED ORDER — IBUPROFEN 600 MG PO TABS
600.0000 mg | ORAL_TABLET | Freq: Four times a day (QID) | ORAL | Status: AC
  Administered 2022-03-21 – 2022-03-23 (×11): 600 mg via ORAL
  Filled 2022-03-20 (×12): qty 1

## 2022-03-20 MED ORDER — KETOROLAC TROMETHAMINE 30 MG/ML IJ SOLN: 30.0000 mg | Freq: Once | INTRAMUSCULAR | Status: DC | PRN

## 2022-03-20 MED ORDER — SODIUM CHLORIDE 0.9 % IV SOLN: INTRAVENOUS | Status: DC | PRN

## 2022-03-20 MED ORDER — ZOLPIDEM TARTRATE 5 MG PO TABS: 5.0000 mg | ORAL_TABLET | Freq: Every evening | ORAL | Status: DC | PRN

## 2022-03-20 MED ORDER — ACETAMINOPHEN 10 MG/ML IV SOLN
INTRAVENOUS | Status: AC
  Filled 2022-03-20: qty 100

## 2022-03-20 MED ORDER — SODIUM CHLORIDE 0.9 % IR SOLN
Status: DC | PRN
  Administered 2022-03-20: 1000 mL

## 2022-03-20 MED ORDER — OXYTOCIN-SODIUM CHLORIDE 30-0.9 UT/500ML-% IV SOLN
INTRAVENOUS | Status: AC
  Filled 2022-03-20: qty 500

## 2022-03-20 MED ORDER — BUPIVACAINE IN DEXTROSE 0.75-8.25 % IT SOLN
INTRATHECAL | Status: DC | PRN
  Administered 2022-03-20: 1.6 mL via INTRATHECAL

## 2022-03-20 MED ORDER — HYDRALAZINE HCL 50 MG PO TABS
50.0000 mg | ORAL_TABLET | ORAL | Status: DC
  Administered 2022-03-21: 50 mg via ORAL
  Filled 2022-03-20 (×2): qty 1

## 2022-03-20 MED ORDER — MORPHINE SULFATE (PF) 0.5 MG/ML IJ SOLN
INTRAMUSCULAR | Status: AC
  Filled 2022-03-20: qty 10

## 2022-03-20 SURGICAL SUPPLY — 47 items
APL PRP STRL LF DISP 70% ISPRP (MISCELLANEOUS) ×1
APL SKNCLS STERI-STRIP NONHPOA (GAUZE/BANDAGES/DRESSINGS) ×1
BARRIER ADHS 3X4 INTERCEED (GAUZE/BANDAGES/DRESSINGS) ×2 IMPLANT
BENZOIN TINCTURE PRP APPL 2/3 (GAUZE/BANDAGES/DRESSINGS) IMPLANT
BRR ADH 4X3 ABS CNTRL BYND (GAUZE/BANDAGES/DRESSINGS) ×1
CHLORAPREP W/TINT 26 (MISCELLANEOUS) ×2 IMPLANT
CLAMP UMBILICAL CORD (MISCELLANEOUS) IMPLANT
CLOTH BEACON ORANGE TIMEOUT ST (SAFETY) ×2 IMPLANT
DRAPE C SECTION CLR SCREEN (DRAPES) ×2 IMPLANT
DRSG OPSITE POSTOP 4X10 (GAUZE/BANDAGES/DRESSINGS) ×2 IMPLANT
ELECT REM PT RETURN 9FT ADLT (ELECTROSURGICAL) ×1
ELECTRODE REM PT RTRN 9FT ADLT (ELECTROSURGICAL) ×2 IMPLANT
EXTRACTOR VACUUM M CUP 4 TUBE (SUCTIONS) IMPLANT
GAUZE SPONGE 4X4 12PLY STRL LF (GAUZE/BANDAGES/DRESSINGS) IMPLANT
GLOVE BIOGEL PI IND STRL 7.0 (GLOVE) ×4 IMPLANT
GLOVE ECLIPSE 6.5 STRL STRAW (GLOVE) ×2 IMPLANT
GOWN STRL REUS W/TWL LRG LVL3 (GOWN DISPOSABLE) ×4 IMPLANT
KIT ABG SYR 3ML LUER SLIP (SYRINGE) IMPLANT
NDL HYPO 25X5/8 SAFETYGLIDE (NEEDLE) IMPLANT
NEEDLE HYPO 22GX1.5 SAFETY (NEEDLE) ×2 IMPLANT
NEEDLE HYPO 25X5/8 SAFETYGLIDE (NEEDLE) IMPLANT
NS IRRIG 1000ML POUR BTL (IV SOLUTION) ×2 IMPLANT
PACK C SECTION WH (CUSTOM PROCEDURE TRAY) ×2 IMPLANT
PAD ABD 7.5X8 STRL (GAUZE/BANDAGES/DRESSINGS) IMPLANT
PAD OB MATERNITY 4.3X12.25 (PERSONAL CARE ITEMS) ×2 IMPLANT
RTRCTR C-SECT PINK 25CM LRG (MISCELLANEOUS) IMPLANT
STRIP CLOSURE SKIN 1/2X4 (GAUZE/BANDAGES/DRESSINGS) IMPLANT
SUT CHROMIC GUT AB #0 18 (SUTURE) IMPLANT
SUT MNCRL 0 VIOLET CTX 36 (SUTURE) ×6 IMPLANT
SUT MON AB 2-0 SH 27 (SUTURE)
SUT MON AB 2-0 SH27 (SUTURE) IMPLANT
SUT MON AB 3-0 SH 27 (SUTURE)
SUT MON AB 3-0 SH27 (SUTURE) IMPLANT
SUT MON AB 4-0 PS1 27 (SUTURE) IMPLANT
SUT MONOCRYL 0 CTX 36 (SUTURE) ×3
SUT PLAIN 2 0 (SUTURE)
SUT PLAIN 2 0 XLH (SUTURE) IMPLANT
SUT PLAIN ABS 2-0 CT1 27XMFL (SUTURE) IMPLANT
SUT VIC AB 0 CT1 36 (SUTURE) ×4 IMPLANT
SUT VIC AB 2-0 CT1 27 (SUTURE) ×1
SUT VIC AB 2-0 CT1 TAPERPNT 27 (SUTURE) ×2 IMPLANT
SUT VIC AB 4-0 PS2 27 (SUTURE) IMPLANT
SYR CONTROL 10ML LL (SYRINGE) ×2 IMPLANT
TOWEL OR 17X24 6PK STRL BLUE (TOWEL DISPOSABLE) ×2 IMPLANT
TRAY FOLEY W/BAG SLVR 14FR LF (SET/KITS/TRAYS/PACK) IMPLANT
WATER STERILE IRR 1000ML POUR (IV SOLUTION) ×2 IMPLANT
YANKAUER SUCT BULB TIP NO VENT (SUCTIONS) IMPLANT

## 2022-03-20 NOTE — Anesthesia Preprocedure Evaluation (Signed)
Anesthesia Evaluation  Patient identified by MRN, date of birth, ID band Patient awake    Reviewed: Allergy & Precautions, NPO status , Patient's Chart, lab work & pertinent test results  Airway Mallampati: II  TM Distance: >3 FB Neck ROM: Full    Dental no notable dental hx.    Pulmonary asthma , sleep apnea    Pulmonary exam normal        Cardiovascular hypertension, Pt. on home beta blockers  Rhythm:Regular Rate:Normal     Neuro/Psych  Headaches PSYCHIATRIC DISORDERS Anxiety Depression       GI/Hepatic Neg liver ROS,GERD  Medicated,,  Endo/Other  Hypothyroidism    Renal/GU      Musculoskeletal   Abdominal  (+) + obese  Peds  Hematology   Anesthesia Other Findings   Reproductive/Obstetrics                             Anesthesia Physical Anesthesia Plan  ASA: 3  Anesthesia Plan: Spinal   Post-op Pain Management:    Induction:   PONV Risk Score and Plan: 0 and Ondansetron and Treatment may vary due to age or medical condition  Airway Management Planned: Natural Airway  Additional Equipment: None  Intra-op Plan:   Post-operative Plan:   Informed Consent: I have reviewed the patients History and Physical, chart, labs and discussed the procedure including the risks, benefits and alternatives for the proposed anesthesia with the patient or authorized representative who has indicated his/her understanding and acceptance.       Plan Discussed with: CRNA  Anesthesia Plan Comments:         Anesthesia Quick Evaluation

## 2022-03-20 NOTE — Anesthesia Postprocedure Evaluation (Signed)
Anesthesia Post Note  Patient: April Dyer  Procedure(s) Performed: CESAREAN SECTION     Patient location during evaluation: PACU Anesthesia Type: Spinal Level of consciousness: awake and alert Pain management: pain level controlled Vital Signs Assessment: post-procedure vital signs reviewed and stable Respiratory status: spontaneous breathing, nonlabored ventilation and respiratory function stable Cardiovascular status: blood pressure returned to baseline Postop Assessment: no apparent nausea or vomiting, spinal receding, no headache and no backache Anesthetic complications: no   No notable events documented.  Last Vitals:  Vitals:   03/20/22 1945 03/20/22 2000  BP: (!) 144/89 137/89  Pulse: 81 74  Resp: 19 19  Temp:    SpO2: 97% 96%    Last Pain:  Vitals:   03/20/22 2000  TempSrc:   PainSc: 0-No pain   Pain Goal:    LLE Motor Response: Purposeful movement (03/20/22 2000) LLE Sensation: Tingling (03/20/22 2000) RLE Motor Response: Purposeful movement (03/20/22 2000) RLE Sensation: Tingling (03/20/22 2000) L Sensory Level: L3-Anterior knee, lower leg (03/20/22 2000) R Sensory Level: L3-Anterior knee, lower leg (03/20/22 2000) Epidural/Spinal Function Cutaneous sensation: Tingles (03/20/22 2000), Patient able to flex knees: Yes (03/20/22 2000), Patient able to lift hips off bed: No (03/20/22 2000), Back pain beyond tenderness at insertion site: No (03/20/22 2000), Progressively worsening motor and/or sensory loss: No (03/20/22 2000), Bowel and/or bladder incontinence post epidural: No (03/20/22 2000)  Marthenia Rolling
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
## 2022-03-20 NOTE — Transfer of Care (Signed)
Immediate Anesthesia Transfer of Care Note  Patient: April Dyer  Procedure(s) Performed: CESAREAN SECTION  Patient Location: PACU  Anesthesia Type:Spinal  Level of Consciousness: awake, alert , and oriented  Airway & Oxygen Therapy: Patient Spontanous Breathing  Post-op Assessment: Report given to RN and Post -op Vital signs reviewed and stable  Post vital signs: Reviewed and stable  Last Vitals:  Vitals Value Taken Time  BP    Temp    Pulse    Resp    SpO2      Last Pain:  Vitals:   03/20/22 1638  TempSrc: Oral         Complications: No notable events documented.

## 2022-03-20 NOTE — Progress Notes (Signed)
Pt did not take her afternoon blood pressure meds due to npo status. Pt's bp in pre op in 169/102, 153/98, 165/87. MD Garwin Brothers and MD Sabra Heck made aware of pt's blood pressure. No new orders.

## 2022-03-20 NOTE — Anesthesia Procedure Notes (Signed)
Spinal  Patient location during procedure: OR Start time: 03/20/2022 6:05 PM End time: 03/20/2022 6:08 PM Reason for block: surgical anesthesia Staffing Performed: anesthesiologist  Anesthesiologist: Brennan Bailey, MD Performed by: Brennan Bailey, MD Authorized by: Brennan Bailey, MD   Preanesthetic Checklist Completed: patient identified, IV checked, risks and benefits discussed, monitors and equipment checked, pre-op evaluation and timeout performed Spinal Block Patient position: sitting Prep: DuraPrep and site prepped and draped Patient monitoring: heart rate, continuous pulse ox and blood pressure Approach: midline Location: L3-4 Injection technique: single-shot Needle Needle type: Pencan  Needle gauge: 24 G Needle length: 10 cm Assessment Sensory level: T4 Events: CSF return Additional Notes Risks, benefits, and alternative discussed. Patient gave consent to procedure. Prepped and draped in sitting position. Clear CSF obtained after one needle pass. Positive terminal aspiration. No pain or paraesthesias with injection. Patient tolerated procedure well. Vital signs stable. Tawny Asal, MD

## 2022-03-20 NOTE — Interval H&P Note (Signed)
History and Physical Interval Note:  03/20/2022 5:47 PM  April Dyer  has presented today for surgery, with the diagnosis of previous cesarean section with history of myomectomy.  The various methods of treatment have been discussed with the patient and family. After consideration of risks, benefits and other options for treatment, the patient has consented to  Procedure(s): CESAREAN SECTION (N/A) as a surgical intervention.  The patient's history has been reviewed, patient examined, no change in status, stable for surgery.  I have reviewed the patient's chart and labs.  Questions were answered to the patient's satisfaction.     Nnenna Meador A Alizabeth Antonio

## 2022-03-20 NOTE — Op Note (Unsigned)
NAME: April Dyer, April Dyer MEDICAL RECORD NO: [id number] ACCOUNT NO: [id number] DATE OF BIRTH: 1970/11/08 FACILITY: [facility] LOCATION: [facility] PHYSICIAN: Zaven Klemens A. Garwin Brothers, MD  Operative Report   DATE OF PROCEDURE: 03/20/2022  PREOPERATIVE DIAGNOSES:  Previous myomectomy, intrauterine gestation at 47 weeks, previous cesarean section, advanced maternal age, chronic hypertension, hypothyroidism, group B strep positive, IVF pregnancy.  PROCEDURE:  Repeat cesarean section, Kerr hysterotomy.  POSTOPERATIVE DIAGNOSES:  Previous cesarean section, intrauterine gestation at 34 weeks, previous myomectomy, advanced maternal age, chronic hypertension, hypothyroidism, group B strep positive, IVF pregnancy.  ANESTHESIA:  Spinal.  SURGEON:  Dalyn Kjos A. Garwin Brothers, MD  ASSISTANT:  Karsten Fells, MD.  Dr. Caron Presume was present and necessary due to the complexity of the procedure and required assistance with the exposure and in order to complete the surgery  ANESTHESIA: Spinal.  DESCRIPTION OF PROCEDURE:  Under adequate spinal anesthesia, the patient was placed in the supine position with a left lateral tilt.  She was sterilely prepped and draped in the usual fashion.  Indwelling Foley catheter was sterilely placed.  0.25%  Marcaine was injected along the previous Pfannenstiel skin incision.  Pfannenstiel skin incision was then made, carried down to the rectus fascia.  Rectus fascia was opened transversely.  The rectus fascia was then bluntly and sharply dissected off the  rectus muscle in a superior and inferior fashion.  The rectus muscle was split in the midline.  The parietal peritoneum was entered sharply and extended.  A self-retaining Alexis retractor was then placed.  Lower uterine segment scarring was noted from a  previous surgery.  Nonetheless, the vesicouterine peritoneum was carefully opened transversely and displaced inferiorly with some limitation. A small curvilinear low transverse incision  was then made and the incision was then extended in a cephalic and  caudad manner using blunt dissection.  Artificial rupture of membranes occurred.  Subsequent delivery of a live female from the right occiput transverse position with the right arm compound presentation was accomplished.  Baby was bulb suctioned in the  abdomen.  Nuchal cord x1 extending to the body was noted and reduced.  The cord was clamped, cut and transferred to the awaiting pediatricians with Apgars 8 and 9 at 1 and 5 minutes respectively.  Delayed cord clamping x1 minute was done.  The placenta which was posterior  was manually removed.  Uterine cavity was cleaned of debris.  Uterine incision had no extension.  Uterine incision was closed in 2 layers, the first layer with 0 Monocryl running locked stitch, second layer was imbricated using 0 Monocryl suture.   Several figure-of-eight sutures was placed along the incision due to individualized bleeding sites.  Hemostasis was subsequently achieved.  Normal tubes and ovaries were noted bilaterally.  There was a right pedunculated/subserosal fundal fibroid, which  was not removed. The abdomen was irrigated, suctioned of debris.  The Alexis retractor was removed. Interceed in an inverted T fashion was placed overlying the uterine incision.  The parietal peritoneum was closed with 2-0 Vicryl.  The rectus fascia  was closed with 0 Vicryl x 2 .  The subcutaneous area was irrigated, small bleeders cauterized.  Interrupted 2-0 plain sutures placed and the skin approximated using 4-0 Vicryl subcuticular closure.  Steri-Strips and benzoin was placed.  Honeycomb dressing was placed.  SPECIMEN:  Placenta, not sent to pathology.  ESTIMATED BLOOD LOSS:  193 mL  INTRAOPERATIVE FLUIDS:  900 mL  URINE OUTPUT:  25 mL  COUNTS:  Sponge and instrument counts x2 correct.  COMPLICATIONS:  None.  DISPOSITION:  The patient tolerated the procedure well and was transferred to recovery  room in stable  condition.   VAI D: 03/20/2022 7:43:56 pm T: 03/20/2022 9:58:00 pm  JOB: [id number]/ [id number]

## 2022-03-20 NOTE — Brief Op Note (Signed)
03/20/2022  7:27 PM  PATIENT:  April Dyer  51 y.o. female  PRE-OPERATIVE DIAGNOSIS:  previous cesarean section with history of myomectomy  POST-OPERATIVE DIAGNOSIS:  previous cesarean section with history of myomectomy  PROCEDURE:  Procedure(s): CESAREAN SECTION (N/A)  SURGEON:  Surgeon(s) and Role:    * Servando Salina, MD - Primary    * Cresenzo, Angelyn Punt, MD - Assisting  PHYSICIAN ASSISTANT:   ASSISTANTS: {ASSISTANTS:31801}   ANESTHESIA:   {procedures; anesthesia:812}  EBL:  243 mL   BLOOD ADMINISTERED:{BLOOD GIVEN TYPES AND AMOUNTS:20467}  DRAINS: {Devices; drains:31758}   LOCAL MEDICATIONS USED:  {LOCAL MEDICATIONS:10721995}  SPECIMEN:  {ONC STAGING; AJCC TYPE OF SPECIMEN:115600001}  DISPOSITION OF SPECIMEN:  {SPECIMEN DISPOSITION:204680}  COUNTS:  {OR COUNTS CORRECT/INCORRECT:204690}  TOURNIQUET:  * No tourniquets in log *  DICTATION: .{DICTATION FXOVA:9191660600}  PLAN OF CARE: {OPTIME PLAN OF KHTX:7741423953}  PATIENT DISPOSITION:  {op note disposition:31782}   Delay start of Pharmacological VTE agent (>24hrs) due to surgical blood loss or risk of bleeding: {YES/NO/NOT APPLICABLE:20182}

## 2022-03-21 ENCOUNTER — Encounter (HOSPITAL_COMMUNITY): Payer: Self-pay | Admitting: Obstetrics and Gynecology

## 2022-03-21 LAB — CBC
HCT: 33.1 % — ABNORMAL LOW (ref 36.0–46.0)
Hemoglobin: 11.3 g/dL — ABNORMAL LOW (ref 12.0–15.0)
MCH: 33.5 pg (ref 26.0–34.0)
MCHC: 34.1 g/dL (ref 30.0–36.0)
MCV: 98.2 fL (ref 80.0–100.0)
Platelets: 223 10*3/uL (ref 150–400)
RBC: 3.37 MIL/uL — ABNORMAL LOW (ref 3.87–5.11)
RDW: 13.3 % (ref 11.5–15.5)
WBC: 13.5 10*3/uL — ABNORMAL HIGH (ref 4.0–10.5)
nRBC: 0 % (ref 0.0–0.2)

## 2022-03-21 MED ORDER — AMLODIPINE BESYLATE 5 MG PO TABS
10.0000 mg | ORAL_TABLET | Freq: Every day | ORAL | Status: DC
  Administered 2022-03-21: 10 mg via ORAL
  Filled 2022-03-21 (×2): qty 2

## 2022-03-21 NOTE — Social Work (Signed)
MOB was referred for history of depression/anxiety.  * Referral screened out by Clinical Social Worker because none of the following criteria appear to apply:  ~ History of anxiety/depression during this pregnancy, or of post-partum depression following prior delivery.  ~ Diagnosis of anxiety and/or depression within last 3 years OR * MOB's symptoms currently being treated with medication and/or therapy.  Per chart review MOB last diagnosis was prior to December 2020. No noted symptoms in [facility] records.   Please contact the Clinical Social Worker if needs arise, by MOB request.   Letta Kocher, Bureau Social Worker [phone]
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
## 2022-03-22 MED ORDER — AMLODIPINE BESYLATE 5 MG PO TABS
5.0000 mg | ORAL_TABLET | Freq: Every day | ORAL | Status: DC
  Administered 2022-03-22 – 2022-03-23 (×2): 5 mg via ORAL
  Filled 2022-03-22 (×2): qty 1

## 2022-03-22 NOTE — Progress Notes (Signed)
Dr. Garwin Brothers aware that we held the 10 mg of Norvasc and she stated she will DC the Apresoline  '50mg'$  due at 4pm. Dr. Garwin Brothers stated to hold if BP is 130/90 or less. Patient encouraged to eat and drink fluids. Patient states she is light headed but lochia is normal.

## 2022-03-22 NOTE — Progress Notes (Signed)
SVD: repeat  S:  Pt reports feeling well/ Tolerating po/ Voiding without problems/ No n/v/ Bleeding is light/ Pain controlled withprescription NSAID's including motrin and narcotic analgesics including oxycodone/acetaminophen (Percocet, Tylox)    O:  A & O x 3 / VS: Blood pressure 116/70, pulse 91, temperature 98 F (36.7 C), resp. rate 18, height 5\' 8"  (1.727 m), weight 110.2 kg, last menstrual period 03/09/2019, SpO2 99 %, unknown if currently breastfeeding.  LABS: No results found for this or any previous visit (from the past 24 hour(s)).  I&O: I/O last 3 completed shifts: In: 900 [I.V.:900] Out: 900 [Urine:850; Blood:50]   No intake/output data recorded.  Lungs: chest clear, no wheezing, rales, normal symmetric air entry  Heart: regular rate and rhythm, S1, S2 normal, no murmur, click, rub or gallop  Abdomen: uterus firm at umb surg tender  Perineum: not inspected  Lochia: light  Extremities:no redness or tenderness in the calves or thighs, edema 1+    A/P: POD # 2/PPD # 2/ [id number]  Doing well  Continue routine post partum orders

## 2022-03-22 NOTE — Lactation Note (Signed)
This note was copied from a baby's chart. Lactation Consultation Note  Patient Name: April Dyer [id number]'A Date: 03/22/2022 Reason for consult: Initial assessment;Mother's request;Early term 37-38.6wks;Hyperbilirubinemia Age:53 hours   P3: Early term infant at 37+0 weeks Feeding preference: Breast Weight loss: 7%  Mother requested latch assistance.  No lactation order in; notified RN and she placed an order.  "April Dyer" is DAT + and under triple phototherapy.  (Biliblanket, bank light and spot light) Bilirubin level is rising.  Last bilirubin was 13.7 at 35 hours of life.  Mother has been breast feeding only; she has not been supplementing or pumping.  Reviewed "April Dyer's" current feeding plan.  Offered to assist with latching; mother receptive.  Mother reports that "April Dyer" has been sleepy and was also very fussy last night.  Asked mother to hand express colostrum and spoon fed 4 mls.  Attempted to latch, however, he showed no interest.  Mother is not interested in providing formula or donor milk.  I suggested she begin pumping with the electric pump.  Knowing she has an 75 month old at home who is still breast feeding I encouraged her to pump.  Mother willing.  Reviewed pump parts, assembly and cleaning.  Mother is using the #21 flanges with comfort; may need a #24 on the right side in the near future.  Mother pumped 30 mls.  Demonstrated paced bottle feeding using the purple extra slow flow nipple and "April Dyer" fed well; burped during and after feeding.  Placed him back under phototherapy; goggles in place.  Mother excited that he was able to consume this volume.  Feeding plan established for tonight.  Suggested parents call their RN if they have any feeding difficulty.  RN updated.   Maternal Data Has patient been taught Hand Expression?: Yes Does the patient have breastfeeding experience prior to this delivery?: Yes How long did the patient breastfeed?: 18 months: still feeding  your child at home  Feeding Mother's Current Feeding Choice: Breast Milk Nipple Type: Extra Slow Flow  LATCH Score Latch: Too sleepy or reluctant, no latch achieved, no sucking elicited.  Audible Swallowing: None  Type of Nipple: Everted at rest and after stimulation  Comfort (Breast/Nipple): Soft / non-tender  Hold (Positioning): Assistance needed to correctly position infant at breast and maintain latch.  LATCH Score: 5   Lactation Tools Discussed/Used Tools: Pump;Flanges Flange Size: 21 Breast pump type: Double-Electric Breast Pump;Manual Pump Education: Setup, frequency, and cleaning;Milk Storage Reason for Pumping: Breast stimulation for supplementation; hyperbilirubinemia Pumping frequency: Every three hours Pumped volume: 30 mL  Interventions Interventions: Breast feeding basics reviewed;Assisted with latch;Skin to skin;Breast massage;Hand express;Breast compression;Hand pump;Expressed milk;Position options;Support pillows;Adjust position;DEBP;Education;LC Services brochure  Discharge Pump: Personal Engineer, structural)  Consult Status Consult Status: Follow-up Date: 03/23/22 Follow-up type: In-patient    Little Ishikawa 03/22/2022, 5:55 PM

## 2022-03-23 NOTE — Progress Notes (Incomplete)
SVD: {findings; c - section delivery reason:31449}  S:  Pt reports feeling ***/ Tolerating po/ Voiding without problems/ No n/v/ Bleeding is {Description; bleeding vaginal:11356}/ Pain controlled with{treatments; pain control med:13496}    O:  A & O x 3 ***/ VS: Blood pressure 122/71, pulse 84, temperature 98.3 F (36.8 C), temperature source Oral, resp. rate 17, height '5\' 8"'$  (1.727 m), weight 110.2 kg, last menstrual period 03/09/2019, SpO2 96 %, unknown if currently breastfeeding.  LABS: No results found for this or any previous visit (from the past 24 hour(s)).  I&O: No intake/output data recorded.   No intake/output data recorded.  Lungs: {Exam; lungs:5033}  Heart: {Exam; heart:5510}  Abdomen: {PE ABDOMEN POSTPARTUM OBGYN:313106}  Perineum: {exam; perineum:10172}  Lochia: ***  Extremities:{pe extremities PJ:093267}    A/P: POD # ***/PPD # ***/ [id number]  Doing well  Continue routine post partum orders  ***

## 2022-03-23 NOTE — Lactation Note (Signed)
This note was copied from a baby's chart. Lactation Consultation Note  Patient Name: April Dyer OEHOZ'Y Date: 03/23/2022 Reason for consult: Follow-up assessment;Early term 37-38.6wks;Hyperbilirubinemia Age:53 hours Asked mom how the baby is doing w/feedings. Mom stated the baby doing better and hoping bili levels will be ok at next lab draw.  Mom has no questions at this time. Encouraged to call for questions or concerns. Maternal Data    Feeding Mother's Current Feeding Choice: Breast Milk Nipple Type: Extra Slow Flow  LATCH Score                    Lactation Tools Discussed/Used    Interventions    Discharge    Consult Status Consult Status: Follow-up Date: 03/24/22 Follow-up type: In-patient    Theodoro Kalata 03/23/2022, 9:44 PM

## 2022-03-24 MED ORDER — AMLODIPINE BESYLATE 5 MG PO TABS: 5.0000 mg | ORAL_TABLET | Freq: Every day | ORAL | 0 refills | Status: DC

## 2022-03-24 MED ORDER — OXYCODONE HCL 5 MG PO TABS: 5.0000 mg | ORAL_TABLET | Freq: Four times a day (QID) | ORAL | 0 refills | Status: AC | PRN

## 2022-03-24 MED ORDER — IBUPROFEN 800 MG PO TABS: 800.0000 mg | ORAL_TABLET | Freq: Three times a day (TID) | ORAL | 11 refills | Status: AC | PRN

## 2022-03-24 MED ORDER — HYDRALAZINE HCL 100 MG PO TABS: 100.0000 mg | ORAL_TABLET | Freq: Two times a day (BID) | ORAL | 0 refills | Status: DC

## 2022-03-24 NOTE — Discharge Summary (Signed)
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 Active Problems:   Postpartum care following cesarean delivery  Additional problems: ***    Discharge diagnosis: {DX.:23714}                                              Post partum procedures:{Postpartum procedures:23558} Augmentation: {{EXHBZJIRCVEL:38101}Complications: {OB Labor/Delivery Complications:20784}  Hospital course: {Courses:23701}  Magnesium Sulfate received: {Mag received:30440022} BMZ received: {BMZ received:30440023} Rhophylac:{Rhophylac received:30440032} MMR:{MMR:30440033} T-DaP:{Tdap:23962} Flu: {{BPZ:02585}Transfusion:{Transfusion received:30440034}  Physical exam  Vitals:   03/23/22 1045 03/23/22 1525 03/23/22 2206 03/24/22 0559  BP: (!) 151/81 (!) 141/82 133/70 (!) 140/79  Pulse: 76 86 77 77  Resp:  _0 Temp:  97.7 F (36.5 C) 98.7 F (37.1 C) 98.2 F (36.8 C)  TempSrc:  Oral Oral Oral  SpO2:  100% 98% 98%  Weight:      Height:       General: {Exam; general:21111117} Lochia: {Desc; appropriate/inappropriate:30686::"appropriate"} Uterine Fundus: {Desc; firm/soft:30687} Incision: {Exam; incision:21111123} DVT Evaluation: {Exam; dvt:2111122} Labs: Lab Results  Component Value Date   WBC 13.5 (H) 03/21/2022   HGB 11.3 (L) 03/21/2022   HCT 33.1 (L) 03/21/2022   MCV 98.2 03/21/2022   PLT 223 03/21/2022      Latest Ref Rng & Units 03/19/2022   12:16 PM  CMP  Glucose 70 - 99 mg/dL 92   BUN 6 - 20  mg/dL 9   Creatinine 0.44 - 1.00 mg/dL 0.79   Sodium 135 - 145 mmol/L 138   Potassium 3.5 - 5.1 mmol/L 4.0   Chloride 98 - 111 mmol/L 110   CO2 22 - 32 mmol/L 22   Calcium 8.9 - 10.3 mg/dL 8.4   Total Protein 6.5 - 8.1 g/dL 5.8   Total Bilirubin 0.3 - 1.2 mg/dL 0.6   Alkaline Phos 38 - 126 U/L 107   AST 15 - 41 U/L 27   ALT 0 - 44 U/L 16    Edinburgh Score:    03/21/2022    9:13 AM  Edinburgh Postnatal Depression Scale Screening Tool  I have been able to laugh and see the funny side of things. 1  I have looked forward with enjoyment to things. 0  I have blamed myself unnecessarily when things went wrong. 1  I have been anxious or worried for no good reason. 0  I have felt scared or panicky for no good reason. 0  Things have been getting on top of me. 2  I have been so unhappy that I have had difficulty sleeping. 0  I have felt sad or miserable. 1  I have been so unhappy that I have been crying. 1  The thought of harming myself has occurred to me. 0  Edinburgh Postnatal Depression Scale Total 6      After visit meds:  Allergies as of 03/24/2022   No Known Allergies      Medication List     TAKE  these medications    albuterol 108 (90 Base) MCG/ACT inhaler Commonly known as: VENTOLIN HFA Inhale 2 puffs into the lungs every 6 (six) hours as needed for wheezing or shortness of breath.   amLODipine 5 MG tablet Commonly known as: NORVASC Take 1 tablet (5 mg total) by mouth at bedtime. What changed:  medication strength how much to take when to take this   aspirin EC 81 MG tablet Take 1 tablet by mouth daily.   HEMORRHOID RE Place 1 Application rectally daily as needed (hemorrhoids).   hydrALAZINE 100 MG tablet Commonly known as: APRESOLINE Take 1 tablet (100 mg total) by mouth 2 (two) times daily. What changed:  medication strength how much to take when to take this additional instructions   ibuprofen 800 MG tablet Commonly known as: ADVIL Take 1  tablet (800 mg total) by mouth every 8 (eight) hours as needed for moderate pain.   labetalol 300 MG tablet Commonly known as: NORMODYNE Take 2 tablets (600 mg total) by mouth 2 (two) times daily. What changed: how much to take   levothyroxine 175 MCG tablet Commonly known as: SYNTHROID Take 175 mcg by mouth daily before breakfast.   oxyCODONE 5 MG immediate release tablet Commonly known as: Oxy IR/ROXICODONE Take 1 tablet (5 mg total) by mouth every 6 (six) hours as needed for up to 7 days for moderate pain or severe pain.   prenatal multivitamin Tabs tablet Take 1 tablet by mouth daily.   SIMPLY SALINE NA Place 1 spray into the nose 3 (three) times daily as needed (congestion).   Tums Ultra 1000 400 MG chewable tablet Generic drug: calcium elemental as carbonate Chew 3,000 mg by mouth daily as needed for heartburn.         Discharge home in stable condition Infant Feeding: {Baby feeding:23562} Infant Disposition:{CHL IP OB HOME WITH CXFQHK:25750} Discharge instruction: per After Visit Summary and Postpartum booklet. Activity: Advance as tolerated. Pelvic rest for 6 weeks.  Diet: {OB diet:21111121} Anticipated Birth Control: {Birth Control:23956} Postpartum Appointment:{Outpatient follow up:23559} Additional Postpartum F/U: {PP Procedure:23957} Future Appointments: Future Appointments  Date Time Provider [place]  04/11/2022 11:20 AM Loel Dubonnet, NP [facility]   Follow up Visit:  Follow-up Information     Servando Salina, MD Follow up in 6 week(s).   Specialty: Obstetrics and Gynecology Contact information: [place] [address] [phone]         Skeet Latch, MD. Schedule an appointment as soon as possible for a visit.   Specialty: Cardiology Contact information: [address] [place] [address] [phone]                     03/24/2022 Marvene Staff, MD

## 2022-03-24 NOTE — Progress Notes (Signed)
SVD: {findings; c - section delivery reason:31449}  S:  Pt reports feeling ***/ Tolerating po/ Voiding without problems/ No n/v/ Bleeding is {Description; bleeding vaginal:11356}/ Pain controlled with{treatments; pain control med:13496}    O:  A & O x 3 ***/ VS: Blood pressure (!) 140/79, pulse 77, temperature 98.2 F (36.8 C), temperature source Oral, resp. rate 20, height '5\' 8"'$  (1.727 m), weight 110.2 kg, last menstrual period 03/09/2019, SpO2 98 %, unknown if currently breastfeeding.  LABS: No results found for this or any previous visit (from the past 24 hour(s)).  I&O: No intake/output data recorded.   No intake/output data recorded.  Lungs: {Exam; lungs:5033}  Heart: {Exam; heart:5510}  Abdomen: {PE ABDOMEN POSTPARTUM OBGYN:313106}  Perineum: {exam; perineum:10172}  Lochia: ***  Extremities:{pe extremities HS:307460}    A/P: POD # ***/PPD # ***/ [id number]  Doing well  Continue routine post partum orders  ***

## 2022-03-24 NOTE — Discharge Instructions (Signed)
Call if temperature greater than equal to 100.4, nothing per vagina for 4-6 weeks or severe nausea vomiting, increased incisional pain , drainage or redness in the incision site, no straining with bowel movements, showers no bath °

## 2022-03-28 ENCOUNTER — Telehealth (HOSPITAL_COMMUNITY): Payer: Self-pay | Admitting: *Deleted

## 2022-03-28 NOTE — Telephone Encounter (Signed)
Attempted hospital discharge follow-up call. Left message for patient to return RN call with any questions or concerns. Erline Levine, RN, 03/28/22, [phone]

## 2022-04-11 ENCOUNTER — Encounter (HOSPITAL_BASED_OUTPATIENT_CLINIC_OR_DEPARTMENT_OTHER): Payer: Self-pay | Admitting: Family

## 2022-04-11 ENCOUNTER — Ambulatory Visit (INDEPENDENT_AMBULATORY_CARE_PROVIDER_SITE_OTHER): Payer: 59 | Admitting: Family

## 2022-04-11 VITALS — BP 144/90 | HR 90 | Ht 68.0 in | Wt 220.0 lb

## 2022-04-11 DIAGNOSIS — I10 Essential (primary) hypertension: Secondary | ICD-10-CM

## 2022-04-11 DIAGNOSIS — I5032 Chronic diastolic (congestive) heart failure: Secondary | ICD-10-CM

## 2022-04-11 MED ORDER — AMLODIPINE BESYLATE 10 MG PO TABS: 10.0000 mg | ORAL_TABLET | Freq: Every day | ORAL | 3 refills | Status: DC
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
## 2022-04-11 NOTE — Patient Instructions (Signed)
Medication Instructions:  Your physician has recommended you make the following change in your medication:   CHANGE Amlodipine to '10mg'$  in the afternoon   Labwork/Testing/Procedures: Your EKG today looked great!   Follow-Up: As scheduled with Dr. Oval Linsey in March

## 2022-04-15 ENCOUNTER — Encounter (HOSPITAL_BASED_OUTPATIENT_CLINIC_OR_DEPARTMENT_OTHER): Payer: Self-pay | Admitting: Family

## 2022-05-01 DIAGNOSIS — R69 Illness, unspecified: Secondary | ICD-10-CM | POA: Diagnosis not present

## 2022-05-02 DIAGNOSIS — N6489 Other specified disorders of breast: Secondary | ICD-10-CM | POA: Diagnosis not present

## 2022-05-02 DIAGNOSIS — R922 Inconclusive mammogram: Secondary | ICD-10-CM | POA: Diagnosis not present

## 2022-05-08 DIAGNOSIS — R69 Illness, unspecified: Secondary | ICD-10-CM | POA: Diagnosis not present

## 2022-05-09 ENCOUNTER — Encounter (HOSPITAL_BASED_OUTPATIENT_CLINIC_OR_DEPARTMENT_OTHER): Payer: Self-pay

## 2022-05-10 MED FILL — Sodium Chloride IV Soln 0.9%: INTRAVENOUS | Qty: 1000 | Status: AC

## 2022-05-10 MED FILL — Heparin Sodium (Porcine) Inj 1000 Unit/ML: INTRAMUSCULAR | Qty: 30 | Status: AC

## 2022-05-15 DIAGNOSIS — F4311 Post-traumatic stress disorder, acute: Secondary | ICD-10-CM | POA: Diagnosis not present

## 2022-05-16 ENCOUNTER — Ambulatory Visit (HOSPITAL_BASED_OUTPATIENT_CLINIC_OR_DEPARTMENT_OTHER): Payer: 59 | Admitting: Family

## 2022-05-16 DIAGNOSIS — Z0100 Encounter for examination of eyes and vision without abnormal findings: Secondary | ICD-10-CM | POA: Diagnosis not present

## 2022-05-22 DIAGNOSIS — F4311 Post-traumatic stress disorder, acute: Secondary | ICD-10-CM | POA: Diagnosis not present

## 2022-05-24 ENCOUNTER — Encounter (HOSPITAL_BASED_OUTPATIENT_CLINIC_OR_DEPARTMENT_OTHER): Payer: Self-pay | Admitting: Family

## 2022-05-24 ENCOUNTER — Ambulatory Visit (INDEPENDENT_AMBULATORY_CARE_PROVIDER_SITE_OTHER): Payer: 59 | Admitting: Family

## 2022-05-24 VITALS — BP 178/100 | HR 62 | Ht 68.0 in | Wt 230.0 lb

## 2022-05-24 DIAGNOSIS — I5032 Chronic diastolic (congestive) heart failure: Secondary | ICD-10-CM

## 2022-05-24 DIAGNOSIS — I1A0 Resistant hypertension: Secondary | ICD-10-CM | POA: Diagnosis not present

## 2022-05-24 MED ORDER — HYDRALAZINE HCL 100 MG PO TABS: 100.0000 mg | ORAL_TABLET | Freq: Three times a day (TID) | ORAL | 5 refills | Status: DC

## 2022-05-24 NOTE — Patient Instructions (Signed)
Medication Instructions:  Your physician has recommended you make the following change in your medication:   Increase: Hydralazine '100mg'$  three times per day   *If you need a refill on your cardiac medications before your next appointment, please call your pharmacy*   Lab Work: Please return for Lab work in one week for Fasting Cortisol, metanephrines, catecholamines, thyroid panel and BMP. You may come to the...   [facility] (3rd floor) [address] [place], [place] [address]  Open: 8am-Noon and 1pm-4:30pm  Please ring the doorbell on the small table when you exit the elevator and the Lab Tech will come get you  [place] [facility] [address] [place], [place],  [address] Open: 8am-1pm, then 2pm-4:30pm   [place]- Please see attached locations sheet stapled to your lab work with address and hours.   If you have labs (blood work) drawn today and your tests are completely normal, you will receive your results only by: [facility] (if you have MyChart) OR A paper copy in the mail If you have any lab test that is abnormal or we need to change your treatment, we will call you to review the results.   Testing/Procedures: We have given you information on renal denervation.    Follow-Up: At [facility], you and your health needs are our priority.  As part of our continuing mission to provide you with exceptional heart care, we have created designated Provider Care Teams.  These Care Teams include your primary Cardiologist (physician) and Advanced Practice Providers (APPs -  Physician Assistants and Nurse Practitioners) who all work together to provide you with the care you need, when you need it.  We recommend signing up for the patient portal called "MyChart".  Sign up information is provided on this After Visit Summary.  MyChart is used to connect with patients for Virtual Visits (Telemedicine).  Patients are able  to view lab/test results, encounter notes, upcoming appointments, etc.  Non-urgent messages can be sent to your provider as well.   To learn more about what you can do with MyChart, go to [email or web address].    Your next appointment:   Follow up as scheduled    Daphene Jaeger will call you in one week to check on BP   Other Instructions Tips to Measure your Blood Pressure Correctly  To determine whether you have hypertension, a medical professional will take a blood pressure reading. How you prepare for the test, the position of your arm, and other factors can change a blood pressure reading by 10% or more. That could be enough to hide high blood pressure, start you on a drug you don't really need, or lead your doctor to incorrectly adjust your medications.  National and international guidelines offer specific instructions for measuring blood pressure. If a doctor, nurse, or medical assistant isn't doing it right, don't hesitate to ask him or her to get with the guidelines.  Here's what you can do to ensure a correct reading:  Don't drink a caffeinated beverage or smoke during the 30 minutes before the test.  Sit quietly for five minutes before the test begins.  During the measurement, sit in a chair with your feet on the floor and your arm supported so your elbow is at about heart level.  The inflatable part of the cuff should completely cover at least 80% of your upper arm, and the cuff should be placed on bare skin, not over a shirt.  Don't talk  during the measurement.  Have your blood pressure measured twice, with a brief break in between. If the readings are different by 5 points or more, have it done a third time.  In 2017, new guidelines from the [employer], the [employer] of Cardiology, and nine other health organizations lowered the diagnosis of high blood pressure to 130/80 mm Hg or higher for all adults. The guidelines also redefined the various blood pressure  categories to now include normal, elevated, Stage 1 hypertension, Stage 2 hypertension, and hypertensive crisis (see "Blood pressure categories").  Blood pressure categories  Blood pressure category SYSTOLIC (upper number)  DIASTOLIC (lower number)  Normal Less than 120 mm Hg and Less than 80 mm Hg  Elevated 120-129 mm Hg and Less than 80 mm Hg  High blood pressure: Stage 1 hypertension 130-139 mm Hg or 80-89 mm Hg  High blood pressure: Stage 2 hypertension 140 mm Hg or higher or 90 mm Hg or higher  Hypertensive crisis (consult your doctor immediately) Higher than 180 mm Hg and/or Higher than 120 mm Hg  Source: American Heart Association and American Stroke Association. For more on getting your blood pressure under control, buy Controlling Your Blood Pressure, a Special Health Report from [facility].   Blood Pressure Log   Date   Time  Blood Pressure  Position  Example: Nov 1 9 AM 124/78 sitting
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
## 2022-05-25 ENCOUNTER — Encounter (HOSPITAL_BASED_OUTPATIENT_CLINIC_OR_DEPARTMENT_OTHER): Payer: Self-pay | Admitting: Family

## 2022-05-31 ENCOUNTER — Encounter (HOSPITAL_BASED_OUTPATIENT_CLINIC_OR_DEPARTMENT_OTHER): Payer: Self-pay

## 2022-06-12 ENCOUNTER — Ambulatory Visit (INDEPENDENT_AMBULATORY_CARE_PROVIDER_SITE_OTHER): Payer: 59 | Admitting: Cardiovascular Disease

## 2022-06-12 ENCOUNTER — Encounter (HOSPITAL_BASED_OUTPATIENT_CLINIC_OR_DEPARTMENT_OTHER): Payer: Self-pay | Admitting: Cardiovascular Disease

## 2022-06-12 VITALS — BP 154/86 | HR 79 | Ht 68.0 in | Wt 232.5 lb

## 2022-06-12 DIAGNOSIS — I10 Essential (primary) hypertension: Secondary | ICD-10-CM

## 2022-06-12 DIAGNOSIS — G4733 Obstructive sleep apnea (adult) (pediatric): Secondary | ICD-10-CM | POA: Diagnosis not present
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
## 2022-06-12 NOTE — Patient Instructions (Signed)
Medication Instructions:  Your physician recommends that you continue on your current medications as directed. Please refer to the Current Medication list given to you today.   Labwork: NONE  Testing/Procedures: NONE  Follow-Up: 10/10/2022 11:00 AM WITH DR [facility]   If you need a refill on your cardiac medications before your next appointment, please call your pharmacy.

## 2022-06-19 DIAGNOSIS — F4311 Post-traumatic stress disorder, acute: Secondary | ICD-10-CM | POA: Diagnosis not present

## 2022-06-26 DIAGNOSIS — F4311 Post-traumatic stress disorder, acute: Secondary | ICD-10-CM | POA: Diagnosis not present

## 2022-06-28 DIAGNOSIS — G43909 Migraine, unspecified, not intractable, without status migrainosus: Secondary | ICD-10-CM | POA: Diagnosis not present

## 2022-06-28 DIAGNOSIS — I10 Essential (primary) hypertension: Secondary | ICD-10-CM | POA: Diagnosis not present

## 2022-06-28 DIAGNOSIS — F418 Other specified anxiety disorders: Secondary | ICD-10-CM | POA: Diagnosis not present

## 2022-06-28 DIAGNOSIS — I119 Hypertensive heart disease without heart failure: Secondary | ICD-10-CM | POA: Diagnosis not present

## 2022-06-28 DIAGNOSIS — K219 Gastro-esophageal reflux disease without esophagitis: Secondary | ICD-10-CM | POA: Diagnosis not present

## 2022-06-28 DIAGNOSIS — R55 Syncope and collapse: Secondary | ICD-10-CM | POA: Diagnosis not present

## 2022-06-28 DIAGNOSIS — E039 Hypothyroidism, unspecified: Secondary | ICD-10-CM | POA: Diagnosis not present

## 2022-06-28 DIAGNOSIS — Z131 Encounter for screening for diabetes mellitus: Secondary | ICD-10-CM | POA: Diagnosis not present

## 2022-07-03 DIAGNOSIS — I10 Essential (primary) hypertension: Secondary | ICD-10-CM | POA: Diagnosis not present

## 2022-07-03 DIAGNOSIS — K219 Gastro-esophageal reflux disease without esophagitis: Secondary | ICD-10-CM | POA: Diagnosis not present

## 2022-07-03 DIAGNOSIS — I119 Hypertensive heart disease without heart failure: Secondary | ICD-10-CM | POA: Diagnosis not present

## 2022-07-03 DIAGNOSIS — R55 Syncope and collapse: Secondary | ICD-10-CM | POA: Diagnosis not present

## 2022-07-03 DIAGNOSIS — G43909 Migraine, unspecified, not intractable, without status migrainosus: Secondary | ICD-10-CM | POA: Diagnosis not present

## 2022-07-03 DIAGNOSIS — F4311 Post-traumatic stress disorder, acute: Secondary | ICD-10-CM | POA: Diagnosis not present

## 2022-07-03 DIAGNOSIS — F418 Other specified anxiety disorders: Secondary | ICD-10-CM | POA: Diagnosis not present

## 2022-07-03 DIAGNOSIS — E039 Hypothyroidism, unspecified: Secondary | ICD-10-CM | POA: Diagnosis not present

## 2022-07-05 ENCOUNTER — Encounter (HOSPITAL_BASED_OUTPATIENT_CLINIC_OR_DEPARTMENT_OTHER): Payer: Self-pay

## 2022-07-05 NOTE — Telephone Encounter (Signed)
Please advise 

## 2022-07-10 DIAGNOSIS — F4311 Post-traumatic stress disorder, acute: Secondary | ICD-10-CM | POA: Diagnosis not present

## 2022-07-17 DIAGNOSIS — G43909 Migraine, unspecified, not intractable, without status migrainosus: Secondary | ICD-10-CM | POA: Diagnosis not present

## 2022-07-17 DIAGNOSIS — Z0000 Encounter for general adult medical examination without abnormal findings: Secondary | ICD-10-CM | POA: Diagnosis not present

## 2022-07-17 DIAGNOSIS — R42 Dizziness and giddiness: Secondary | ICD-10-CM | POA: Diagnosis not present

## 2022-07-17 DIAGNOSIS — F4311 Post-traumatic stress disorder, acute: Secondary | ICD-10-CM | POA: Diagnosis not present

## 2022-07-17 DIAGNOSIS — I119 Hypertensive heart disease without heart failure: Secondary | ICD-10-CM | POA: Diagnosis not present

## 2022-07-17 DIAGNOSIS — I10 Essential (primary) hypertension: Secondary | ICD-10-CM | POA: Diagnosis not present

## 2022-07-17 DIAGNOSIS — K219 Gastro-esophageal reflux disease without esophagitis: Secondary | ICD-10-CM | POA: Diagnosis not present

## 2022-07-17 DIAGNOSIS — E039 Hypothyroidism, unspecified: Secondary | ICD-10-CM | POA: Diagnosis not present

## 2022-07-17 DIAGNOSIS — R55 Syncope and collapse: Secondary | ICD-10-CM | POA: Diagnosis not present

## 2022-07-17 DIAGNOSIS — N289 Disorder of kidney and ureter, unspecified: Secondary | ICD-10-CM | POA: Diagnosis not present

## 2022-07-17 DIAGNOSIS — F418 Other specified anxiety disorders: Secondary | ICD-10-CM | POA: Diagnosis not present

## 2022-07-17 NOTE — Telephone Encounter (Signed)
Needs OV in ADV HTN clinic to assess BP, dizziness, determine what workup needed, assess return to work.  Recommend Labetolol  BID as prescribed (can provide refill if needed).   Alver Sorrow, NP

## 2022-07-24 DIAGNOSIS — F4311 Post-traumatic stress disorder, acute: Secondary | ICD-10-CM | POA: Diagnosis not present

## 2022-07-31 DIAGNOSIS — F4311 Post-traumatic stress disorder, acute: Secondary | ICD-10-CM | POA: Diagnosis not present

## 2022-08-05 ENCOUNTER — Ambulatory Visit (INDEPENDENT_AMBULATORY_CARE_PROVIDER_SITE_OTHER): Payer: 59 | Admitting: Cardiovascular Disease

## 2022-08-05 ENCOUNTER — Encounter: Payer: Self-pay | Admitting: *Deleted

## 2022-08-05 ENCOUNTER — Encounter (HOSPITAL_BASED_OUTPATIENT_CLINIC_OR_DEPARTMENT_OTHER): Payer: Self-pay | Admitting: Cardiovascular Disease

## 2022-08-05 ENCOUNTER — Telehealth (HOSPITAL_BASED_OUTPATIENT_CLINIC_OR_DEPARTMENT_OTHER): Payer: Self-pay | Admitting: Cardiovascular Disease

## 2022-08-05 VITALS — BP 176/129 | HR 85 | Ht 68.0 in | Wt 237.9 lb

## 2022-08-05 DIAGNOSIS — R42 Dizziness and giddiness: Secondary | ICD-10-CM | POA: Diagnosis not present

## 2022-08-05 DIAGNOSIS — R011 Cardiac murmur, unspecified: Secondary | ICD-10-CM

## 2022-08-05 DIAGNOSIS — R55 Syncope and collapse: Secondary | ICD-10-CM

## 2022-08-05 DIAGNOSIS — Z5181 Encounter for therapeutic drug level monitoring: Secondary | ICD-10-CM | POA: Diagnosis not present

## 2022-08-05 DIAGNOSIS — I1A0 Resistant hypertension: Secondary | ICD-10-CM | POA: Diagnosis not present

## 2022-08-05 HISTORY — DX: Cardiac murmur, unspecified: R01.1

## 2022-08-05 MED ORDER — LISINOPRIL 40 MG PO TABS: 40.0000 mg | ORAL_TABLET | Freq: Every day | ORAL | 3 refills | Status: DC

## 2022-08-05 MED ORDER — MECLIZINE HCL 25 MG PO TABS: 25.0000 mg | ORAL_TABLET | Freq: Three times a day (TID) | ORAL | 0 refills | Status: AC | PRN

## 2022-08-05 NOTE — Assessment & Plan Note (Addendum)
She has severe dizziness that seems to be consistent with vertigo.  No clear trigger.  She is not orthostatic on exam but was very dizzy with sitting and standing.  Mild nystagmus on exam.  She is also noted some speech deficits and cognitive changes.  We will get a noncontrast head CT.  She has consultation with neurology scheduled next month.  Will provide meclizine 25mg  q8h prn.  Advised limited use while breast feeding.  She is so dizzy she can barely ambulate the halls.

## 2022-08-05 NOTE — Telephone Encounter (Signed)
Left message for patient to call and discuss scheduling the Echocardiogram and CT head w/o contrast ordered by Dr. Duke Salvia

## 2022-08-05 NOTE — Progress Notes (Signed)
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  Firmly press and release the circle in the center of the monitor battery. You will hear a small beep. This is turning the monitor battery on. The heart emblem on the monitor battery will light up every 5 seconds if the monitor battery in turned on and connected to the patient securely. Do not push and hold the circle down as this turns the monitor battery off. The cell phone will locate the monitor battery. A screen will appear on the cell phone checking the connection of your monitor strip. This may read poor connection initially but change to good connection within the next minute. Once your monitor accepts the connection you will hear a series of 3 beeps followed by a climbing crescendo of beeps. A screen will appear on the cell phone showing the two monitor strip placement options. Touch the picture that demonstrates where you applied the monitor strip.  Your monitor strip and battery are waterproof. You are able to shower, bathe,  or swim with the monitor on. They just ask you do not submerge deeper than 3 feet underwater. We recommend removing the monitor if you are swimming in a lake, river, or ocean.  Your monitor battery will need to be switched to a fully charged monitor battery approximately once a week. The cell phone will alert you of an action which needs to be made.  On the cell phone, tap for details to reveal connection status, monitor battery status, and cell phone battery status. The green dots indicates your monitor is in good status. A red dot indicates there is something that needs your attention.  To record a symptom, click the circle on the monitor battery. In 30-60 seconds a list of symptoms will appear on the cell phone. Select your symptom and tap save. Your monitor will record a sustained or significant arrhythmia regardless of you clicking the button. Some patients do not feel the heart rhythm irregularities. [employer] will notify us of any serious or critical events.  Refer to instruction booklet for instructions on switching batteries, changing strips, the Do not disturb or Pause features, or any additional questions.  Call [employer] at [phone], to confirm your monitor is transmitting and record your baseline. They will answer any questions you may have regarding the monitor instructions at that time.  Returning the monitor to [employer]  Place all equipment back into blue box. Peel off strip of paper to expose adhesive and close box securely. There is a prepaid UPS shipping label on this box. Drop in a  UPS drop box, or at a UPS facility like [employer]. You may also contact [employer] to arrange UPS to pick up monitor package at your home.    I,Mathew Stumpf,acting as a Neurosurgeon for Chilton Si, MD.,have documented all relevant documentation on the behalf of Chilton Si, MD,as directed by  Chilton Si, MD while in the presence of Chilton Si, MD.  I, Teyonna Plaisted C.  Duke Salvia, MD have reviewed all documentation for this visit.  The documentation of the exam, diagnosis, procedures, and orders on 08/05/2022 are all accurate and complete.  Signed, Chilton Si, MD  08/05/2022 12:18 PM    [facility]

## 2022-08-05 NOTE — Assessment & Plan Note (Addendum)
Blood pressure is very uncontrolled.  She is experiencing dizziness and syncope.  There is no more room to titrate on her current agents.  Continue amlodipine, hydralazine, and labetalol.  We will add lisinopril 40 mg daily.  Check a BMP in 1 week.  She is definitely not orthostatic.  I do not think her blood pressure explains her syncope but it certainly could be contributing to her dizziness.  When she stops breast-feeding would add a thiazide diuretic.

## 2022-08-05 NOTE — Progress Notes (Unsigned)
Patient enrolled for [employer] to ship a 30 day cardiac event monitor to her land address.  PO Box given for billing purposes.

## 2022-08-05 NOTE — Assessment & Plan Note (Signed)
Systolic murmur noted on exam, which is new.  She does have very poorly controlled hypertension and LVH.  This might be hypertrophic cardiomyopathy.  Given her episodes of syncope, we need to rule out structural heart disease.  Check an echocardiogram.

## 2022-08-05 NOTE — Patient Instructions (Addendum)
Medication Instructions:  START LISINOPRIL 40 MG DAILY   Labwork: BMET IN 1 WEEK   Testing/Procedures: Your physician has requested that you have an echocardiogram. Echocardiography is a painless test that uses sound waves to create images of your heart. It provides your doctor with information about the size and shape of your heart and how well your heart's chambers and valves are working. This procedure takes approximately one hour. There are no restrictions for this procedure. Please do NOT wear cologne, perfume, aftershave, or lotions (deodorant is allowed). Please arrive 15 minutes prior to your appointment time.  HEAD CT   Your physician has recommended that you wear an event monitor. Event monitors are medical devices that record the heart's electrical activity. Doctors most often [place] these monitors to diagnose arrhythmias. Arrhythmias are problems with the speed or rhythm of the heartbeat. The monitor is a small, portable device. You can wear one while you do your normal daily activities. This is usually used to diagnose what is causing palpitations/syncope (passing out). 30 DAY   Follow-Up: 5/30 2:45 pm with Ronn Melena NP   7/18 11:00 am with Dr Duke Salvia   Any Other Special Instructions Will Be Listed Below (If Applicable).  [employer] Cardiac Event Monitor Instructions  Your physician has requested you wear your cardiac event monitor for __30__ days, (1-30). [employer] may call or text to confirm a shipping address. The monitor will be sent to a land address via UPS. [employer] will not ship a monitor to a PO BOX. It typically takes 3-5 days to receive your monitor after it has been enrolled. [employer] will assist with USPS tracking if your package is delayed. The telephone number for [employer] is [phone]. Once you have received your monitor, please review the enclosed instructions. Instruction tutorials can also be viewed under help and settings on the enclosed cell  phone. Your monitor has already been registered assigning a specific monitor serial # to you.  Billing and Self Pay Discount Information  [employer] has been provided the insurance information we had on file for you.  If your insurance has been updated, please call [employer] at [phone] to provide them with your updated insurance information.   [employer] offers a discounted Self Pay option for patients who have insurance that does not cover their cardiac event monitor or patients without insurance.  The discounted cost of a Self Pay Cardiac Event Monitor would be $225.00 , if the patient contacts [employer] at [phone] within 7 days of applying the monitor to make payment arrangements.  If the patient does not contact [employer] within 7 days of applying the monitor, the cost of the cardiac event monitor will be $350.00.  Applying the monitor  Remove cell phone from case and turn it on. The cell phone works as IT consultant and needs to be within [employer] of you at all times. The cell phone will need to be charged on a daily basis. We recommend you plug the cell phone into the enclosed charger at your bedside table every night.  Monitor batteries: You will receive two monitor batteries labelled #1 and #2. These are your recorders. Plug battery #2 onto the second connection on the enclosed charger. Keep one battery on the charger at all times. This will keep the monitor battery deactivated. It will also keep it fully charged for when you need to switch your monitor batteries. A small light will be blinking on the battery emblem when it is charging. The light on the battery emblem  will remain on when the battery is fully charged.  Open package of a Monitor strip. Insert battery #1 into black hood on strip and gently squeeze monitor battery onto connection as indicated in instruction booklet. Set aside while preparing skin.  Choose location for your strip, vertical or horizontal, as  indicated in the instruction booklet. Shave to remove all hair from location. There cannot be any lotions, oils, powders, or colognes on skin where monitor is to be applied. Wipe skin clean with enclosed Saline wipe. Dry skin completely.  Peel paper labeled #1 off the back of the Monitor strip exposing the adhesive. Place the monitor on the chest in the vertical or horizontal position shown in the instruction booklet. One arrow on the monitor strip must be pointing upward. Carefully remove paper labeled #2, attaching remainder of strip to your skin. Try not to create any folds or wrinkles in the strip as you apply it.  Firmly press and release the circle in the center of the monitor battery. You will hear a small beep. This is turning the monitor battery on. The heart emblem on the monitor battery will light up every 5 seconds if the monitor battery in turned on and connected to the patient securely. Do not push and hold the circle down as this turns the monitor battery off. The cell phone will locate the monitor battery. A screen will appear on the cell phone checking the connection of your monitor strip. This may read poor connection initially but change to good connection within the next minute. Once your monitor accepts the connection you will hear a series of 3 beeps followed by a climbing crescendo of beeps. A screen will appear on the cell phone showing the two monitor strip placement options. Touch the picture that demonstrates where you applied the monitor strip.  Your monitor strip and battery are waterproof. You are able to shower, bathe, or swim with the monitor on. They just ask you do not submerge deeper than 3 feet underwater. We recommend removing the monitor if you are swimming in a lake, river, or ocean.  Your monitor battery will need to be switched to a fully charged monitor battery approximately once a week. The cell phone will alert you of an action which needs to be  made.  On the cell phone, tap for details to reveal connection status, monitor battery status, and cell phone battery status. The green dots indicates your monitor is in good status. A red dot indicates there is something that needs your attention.  To record a symptom, click the circle on the monitor battery. In 30-60 seconds a list of symptoms will appear on the cell phone. Select your symptom and tap save. Your monitor will record a sustained or significant arrhythmia regardless of you clicking the button. Some patients do not feel the heart rhythm irregularities. [employer] will notify us of any serious or critical events.  Refer to instruction booklet for instructions on switching batteries, changing strips, the Do not disturb or Pause features, or any additional questions.  Call [employer] at [phone], to confirm your monitor is transmitting and record your baseline. They will answer any questions you may have regarding the monitor instructions at that time.  Returning the monitor to [employer]  Place all equipment back into blue box. Peel off strip of paper to expose adhesive and close box securely. There is a prepaid UPS shipping label on this box. Drop in a UPS drop box, or at a UPS facility  like [employer]. You Scallan also contact [employer] to arrange UPS to pick up monitor package at your home.

## 2022-08-05 NOTE — Assessment & Plan Note (Signed)
Checking an echo as above.  We will also have her wear a 30-day ZIO monitor.

## 2022-08-06 NOTE — Telephone Encounter (Signed)
Left message for patient to call and schedule the Echocardiogram ordered by Dr. [place] 

## 2022-08-07 DIAGNOSIS — F4311 Post-traumatic stress disorder, acute: Secondary | ICD-10-CM | POA: Diagnosis not present

## 2022-08-08 ENCOUNTER — Ambulatory Visit (HOSPITAL_BASED_OUTPATIENT_CLINIC_OR_DEPARTMENT_OTHER)
Admission: RE | Admit: 2022-08-08 | Discharge: 2022-08-08 | Disposition: A | Discharge status: Home / Self Care | Payer: 59 | Source: Ambulatory Visit | Attending: Cardiovascular Disease | Admitting: Cardiovascular Disease

## 2022-08-08 DIAGNOSIS — R55 Syncope and collapse: Secondary | ICD-10-CM | POA: Diagnosis not present

## 2022-08-08 DIAGNOSIS — R42 Dizziness and giddiness: Secondary | ICD-10-CM | POA: Insufficient documentation

## 2022-08-08 DIAGNOSIS — Z5181 Encounter for therapeutic drug level monitoring: Secondary | ICD-10-CM | POA: Diagnosis not present

## 2022-08-09 LAB — BASIC METABOLIC PANEL
BUN/Creatinine Ratio: 19 (ref 9–23)
BUN: 18 mg/dL (ref 6–24)
CO2: 22 mmol/L (ref 20–29)
Calcium: 10.5 mg/dL — ABNORMAL HIGH (ref 8.7–10.2)
Chloride: 106 mmol/L (ref 96–106)
Creatinine, Ser: 0.96 mg/dL (ref 0.57–1.00)
Glucose: 78 mg/dL (ref 70–99)
Potassium: 4.5 mmol/L (ref 3.5–5.2)
Sodium: 144 mmol/L (ref 134–144)
eGFR: 71 mL/min/{1.73_m2} (ref >59)

## 2022-08-09 NOTE — Telephone Encounter (Signed)
Patient was seen by Dr  5/13 

## 2022-08-12 ENCOUNTER — Telehealth (HOSPITAL_BASED_OUTPATIENT_CLINIC_OR_DEPARTMENT_OTHER): Payer: Self-pay | Admitting: *Deleted

## 2022-08-12 NOTE — Telephone Encounter (Signed)
Advised patient, verbalized understanding  

## 2022-08-12 NOTE — Telephone Encounter (Signed)
Patient called stating she had not received event monitor yet. Reached out to [place] who responded delivered to "locker" 2:36 pm today.   Mychart message to patient and left message to call back

## 2022-08-13 DIAGNOSIS — R55 Syncope and collapse: Secondary | ICD-10-CM | POA: Diagnosis not present

## 2022-08-13 DIAGNOSIS — R42 Dizziness and giddiness: Secondary | ICD-10-CM | POA: Diagnosis not present

## 2022-08-14 DIAGNOSIS — F4311 Post-traumatic stress disorder, acute: Secondary | ICD-10-CM | POA: Diagnosis not present

## 2022-08-16 ENCOUNTER — Encounter: Payer: Self-pay | Admitting: Diagnostic Neuroimaging

## 2022-08-16 ENCOUNTER — Ambulatory Visit: Payer: 59 | Admitting: Diagnostic Neuroimaging

## 2022-08-17 ENCOUNTER — Telehealth: Payer: Self-pay | Admitting: Physician Assistant

## 2022-08-17 NOTE — Telephone Encounter (Signed)
Overnight fellow relayed he received a call from monitor company of a 9-beat run of NSVT at ?2-3AM. Pt was not called.  I called her this morning and confirmed she was sleeping. She feels well this morning, no lightheadedness or dizziness, no palpitations. I reviewed ER precautions and that if she needed the ER to not drive. She expressed understanding of the plan.  Roe [name] Lynnix Schoneman, PA-C 08/17/2022, 8:42 AM [facility]

## 2022-08-21 ENCOUNTER — Encounter (HOSPITAL_BASED_OUTPATIENT_CLINIC_OR_DEPARTMENT_OTHER): Payer: Self-pay | Admitting: Cardiovascular Disease

## 2022-08-22 ENCOUNTER — Ambulatory Visit (HOSPITAL_BASED_OUTPATIENT_CLINIC_OR_DEPARTMENT_OTHER): Payer: 59 | Admitting: Family

## 2022-08-22 ENCOUNTER — Encounter (HOSPITAL_BASED_OUTPATIENT_CLINIC_OR_DEPARTMENT_OTHER): Payer: Self-pay | Admitting: Family

## 2022-08-22 ENCOUNTER — Ambulatory Visit (INDEPENDENT_AMBULATORY_CARE_PROVIDER_SITE_OTHER): Payer: 59 | Admitting: Family

## 2022-08-22 VITALS — BP 142/78 | HR 87 | Ht 68.0 in | Wt 239.0 lb

## 2022-08-22 DIAGNOSIS — I5032 Chronic diastolic (congestive) heart failure: Secondary | ICD-10-CM

## 2022-08-22 DIAGNOSIS — R42 Dizziness and giddiness: Secondary | ICD-10-CM | POA: Diagnosis not present

## 2022-08-22 DIAGNOSIS — R011 Cardiac murmur, unspecified: Secondary | ICD-10-CM

## 2022-08-22 DIAGNOSIS — I1A0 Resistant hypertension: Secondary | ICD-10-CM

## 2022-08-22 MED ORDER — HYDROCHLOROTHIAZIDE 25 MG PO TABS
25.0000 mg | ORAL_TABLET | Freq: Every day | ORAL | 3 refills | Status: DC
Start: 2022-08-22 — End: 2022-09-06
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
## 2022-08-22 NOTE — Patient Instructions (Signed)
Medication Instructions:  Your physician has recommended you make the following change in your medication:  Start: Hydrochlorothiazide 25mg  daily  *If you need a refill on your cardiac medications before your next appointment, please call your pharmacy*   Lab Work: Please return for Lab work in one week for BMP. You may come to the...   [facility] (3rd floor) [address] [place], [place] [address]  Open: 8am-Noon and 1pm-4:30pm  Please ring the doorbell on the small table when you exit the elevator and the Lab Tech will come get you  [facility] at [facility] [address], [place], [place] [address] Open: 8am-1pm, then 2pm-4:30pm   [facility]- Please see attached locations sheet stapled to your lab work with address and hours.   If you have labs (blood work) drawn today and your tests are completely normal, you will receive your results only by: MyChart Message (if you have MyChart) OR A paper copy in the mail If you have any lab test that is abnormal or we need to change your treatment, we will call you to review the results.   Follow-Up: At [facility], you and your health needs are our priority.  As part of our continuing mission to provide you with exceptional heart care, we have created designated Provider Care Teams.  These Care Teams include your primary Cardiologist (physician) and Advanced Practice Providers (APPs -  Physician Assistants and Nurse Practitioners) who all work together to provide you with the care you need, when you need it.  We recommend signing up for the patient portal called "MyChart".  Sign up information is provided on this After Visit Summary.  MyChart is used to connect with patients for Virtual Visits (Telemedicine).  Patients are able to view lab/test results, encounter notes, upcoming appointments, etc.  Non-urgent messages can be sent to your provider as well.   To learn more about what  you can do with MyChart, go to [email or web address].    Your next appointment:   2-3 week(s)  Provider:   Gillian Shields, NP    Other Instructions Hydrate! Hydrate! Hydrate!

## 2022-08-25 ENCOUNTER — Encounter (HOSPITAL_BASED_OUTPATIENT_CLINIC_OR_DEPARTMENT_OTHER): Payer: Self-pay | Admitting: Family

## 2022-08-25 NOTE — Telephone Encounter (Signed)
Discussed at recent clinic visit.   Alver Sorrow, NP

## 2022-08-27 ENCOUNTER — Encounter: Payer: Self-pay | Admitting: Neurology

## 2022-08-27 ENCOUNTER — Other Ambulatory Visit: Payer: Self-pay | Admitting: Diagnostic Neuroimaging

## 2022-08-27 ENCOUNTER — Ambulatory Visit: Payer: 59 | Admitting: Diagnostic Neuroimaging

## 2022-08-27 ENCOUNTER — Ambulatory Visit (INDEPENDENT_AMBULATORY_CARE_PROVIDER_SITE_OTHER): Payer: 59 | Admitting: Diagnostic Neuroimaging

## 2022-08-27 ENCOUNTER — Telehealth: Payer: Self-pay | Admitting: Diagnostic Neuroimaging

## 2022-08-27 ENCOUNTER — Encounter: Payer: Self-pay | Admitting: Diagnostic Neuroimaging

## 2022-08-27 VITALS — BP 184/106 | HR 74 | Ht 69.0 in | Wt 240.0 lb

## 2022-08-27 DIAGNOSIS — I1A0 Resistant hypertension: Secondary | ICD-10-CM

## 2022-08-27 DIAGNOSIS — R5383 Other fatigue: Secondary | ICD-10-CM | POA: Diagnosis not present

## 2022-08-27 DIAGNOSIS — G43109 Migraine with aura, not intractable, without status migrainosus: Secondary | ICD-10-CM | POA: Diagnosis not present

## 2022-08-27 DIAGNOSIS — R4189 Other symptoms and signs involving cognitive functions and awareness: Secondary | ICD-10-CM

## 2022-08-27 DIAGNOSIS — R42 Dizziness and giddiness: Secondary | ICD-10-CM | POA: Diagnosis not present

## 2022-08-27 MED ORDER — NURTEC 75 MG PO TBDP
75.0000 mg | ORAL_TABLET | ORAL | 6 refills | Status: DC
Start: 2022-08-27 — End: 2022-10-14

## 2022-08-27 NOTE — Patient Instructions (Signed)
NEW ONSET BRAIN FOG, DIZZINESS, NAUSEA (severe, since Dec 2023; worsening) - check MRI brain wo (rule out posterior fossa lesion, stroke, mass)  MIGRAINE WITH AURA (2-3x / week))  MIGRAINE PREVENTION  LIFESTYLE CHANGES -Stop or avoid smoking -Decrease or avoid caffeine / alcohol -Eat and sleep on a regular schedule -Exercise several times per week - start rimegepant (Nurtec) 75mg  every other day (currently lactating, which limits other medication options)

## 2022-08-27 NOTE — Telephone Encounter (Signed)
sent to GI they obtain [employer] auth [phone] 
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
## 2022-08-29 DIAGNOSIS — F418 Other specified anxiety disorders: Secondary | ICD-10-CM | POA: Diagnosis not present

## 2022-08-29 DIAGNOSIS — K219 Gastro-esophageal reflux disease without esophagitis: Secondary | ICD-10-CM | POA: Diagnosis not present

## 2022-08-29 DIAGNOSIS — R42 Dizziness and giddiness: Secondary | ICD-10-CM | POA: Diagnosis not present

## 2022-08-29 DIAGNOSIS — N289 Disorder of kidney and ureter, unspecified: Secondary | ICD-10-CM | POA: Diagnosis not present

## 2022-08-29 DIAGNOSIS — R55 Syncope and collapse: Secondary | ICD-10-CM | POA: Diagnosis not present

## 2022-08-29 DIAGNOSIS — I119 Hypertensive heart disease without heart failure: Secondary | ICD-10-CM | POA: Diagnosis not present

## 2022-08-29 DIAGNOSIS — I10 Essential (primary) hypertension: Secondary | ICD-10-CM | POA: Diagnosis not present

## 2022-08-29 DIAGNOSIS — Z0000 Encounter for general adult medical examination without abnormal findings: Secondary | ICD-10-CM | POA: Diagnosis not present

## 2022-08-29 DIAGNOSIS — R110 Nausea: Secondary | ICD-10-CM | POA: Diagnosis not present

## 2022-08-29 DIAGNOSIS — E039 Hypothyroidism, unspecified: Secondary | ICD-10-CM | POA: Diagnosis not present

## 2022-08-29 DIAGNOSIS — G43909 Migraine, unspecified, not intractable, without status migrainosus: Secondary | ICD-10-CM | POA: Diagnosis not present

## 2022-08-29 DIAGNOSIS — Z131 Encounter for screening for diabetes mellitus: Secondary | ICD-10-CM | POA: Diagnosis not present

## 2022-09-02 ENCOUNTER — Encounter (HOSPITAL_BASED_OUTPATIENT_CLINIC_OR_DEPARTMENT_OTHER): Payer: Self-pay

## 2022-09-03 ENCOUNTER — Ambulatory Visit (INDEPENDENT_AMBULATORY_CARE_PROVIDER_SITE_OTHER): Payer: 59

## 2022-09-03 ENCOUNTER — Ambulatory Visit: Payer: 59 | Attending: Family | Admitting: Physical Therapy

## 2022-09-03 VITALS — BP 182/120 | HR 73

## 2022-09-03 DIAGNOSIS — I1A0 Resistant hypertension: Secondary | ICD-10-CM | POA: Diagnosis not present

## 2022-09-03 DIAGNOSIS — R011 Cardiac murmur, unspecified: Secondary | ICD-10-CM | POA: Diagnosis not present

## 2022-09-03 DIAGNOSIS — R42 Dizziness and giddiness: Secondary | ICD-10-CM | POA: Insufficient documentation

## 2022-09-03 LAB — ECHOCARDIOGRAM COMPLETE
Area-P 1/2: 4.89 cm2
S' Lateral: 2.7 cm

## 2022-09-03 LAB — BASIC METABOLIC PANEL
BUN/Creatinine Ratio: 10 (ref 9–23)
BUN: 10 mg/dL (ref 6–24)
CO2: 23 mmol/L (ref 20–29)
Calcium: 9.2 mg/dL (ref 8.7–10.2)
Chloride: 106 mmol/L (ref 96–106)
Creatinine, Ser: 1 mg/dL (ref 0.57–1.00)
Glucose: 90 mg/dL (ref 70–99)
Potassium: 3.9 mmol/L (ref 3.5–5.2)
Sodium: 142 mmol/L (ref 134–144)
eGFR: 67 mL/min/{1.73_m2} (ref >59)

## 2022-09-03 NOTE — Therapy (Signed)
[facility] [address] [place], [place], [address] Phone: [phone]   Fax:  [phone]  Patient Details  Name: April Dyer MRN: [id number] Date of Birth: 1970/05/21 Referring Provider:  Alver Sorrow, NP  Encounter Date: 09/03/2022  Session was arrive no charge. Patient arrives to session and states BP has been elevated all day. BP was assessed at start of session. BP was out of safe range. Patient states that she has been dizzy all day and can tell her BP is high because she is getting some numbness and tingling along L lip. Therapists asks if she can call EMS for patient. Patient refuses. Patient states her mom can drive her. Given being symptomatic, therapist recommends patient go to ED immediately. Patient refuses and states she is going to have her mom take her home so she can lie down. Patient aware of dangers and leaves. Plans to reschedule when values are in range. Therapist maintains recommendation of going to ED.   Vitals:   09/03/22 1107 09/03/22 1113  BP: (!) 171/108 (!) 182/120  Pulse: 73     Carmelia Bake, PT, DPT 09/03/2022, 11:42 AM  [facility] [address] [place], [place], [address] Phone: [phone]   Fax:  [phone]

## 2022-09-05 ENCOUNTER — Telehealth (HOSPITAL_BASED_OUTPATIENT_CLINIC_OR_DEPARTMENT_OTHER): Payer: Self-pay

## 2022-09-05 DIAGNOSIS — I1A0 Resistant hypertension: Secondary | ICD-10-CM

## 2022-09-05 NOTE — Telephone Encounter (Addendum)
Called patient and reviewed her results, patient verbalizes understanding. She states her blood pressure has been running around 174/110 with the highest being 180/115. She did go to neurophysical therapy and they would not see her due too high of BP. She said they instructed her that the cut of is 180/99, she has been removed from the program at this time until her blood pressure is better controlled.      ----- Message from Alver Sorrow, NP sent at 09/05/2022  1:26 PM EDT ----- Normal kidney function electrolytes.  Good result!   Can we please call to get BP readings? (She did not respond to MyChart from 6/10)

## 2022-09-06 MED ORDER — CHLORTHALIDONE 25 MG PO TABS
25.0000 mg | ORAL_TABLET | Freq: Every day | ORAL | 11 refills | Status: DC
Start: 2022-09-06 — End: 2022-11-21

## 2022-09-06 NOTE — Telephone Encounter (Signed)
Reviewed with Belenda Cruise, [facility].   Please ensure she is taking medications as prescribed and avoiding caffeine. Recommend predominantly checking BP one hour after medications.   We are limited on options for lowering blood pressure given breast feeding.   We can either: Plan to wean to stop breast feeding and change Lisinsopril to ARB Or Stop HCTZ 25mg  daily and start Chlorthalidone 25mg  QD for better 24h coverage.   Alver Sorrow, NP

## 2022-09-06 NOTE — Addendum Note (Signed)
Addended by: Marlene Lard on: 09/06/2022 10:40 AM   Modules accepted: Orders

## 2022-09-06 NOTE — Telephone Encounter (Signed)
Returned call to patient and reviewed the following recommendations. Patient opts to switch from HCTZ to chorthalidone. She states she will look at her milk supply and see how much she has and see what she can pump out and look into potentially weaning. Advised patient to give the new medication at least a solid week and then I would follow up with her upon my return to the office and we can go from there.       "Reviewed with Belenda Cruise, [facility].    Please ensure she is taking medications as prescribed and avoiding caffeine. Recommend predominantly checking BP one hour after medications.    We are limited on options for lowering blood pressure given breast feeding.   We can either: Plan to wean to stop breast feeding and change Lisinsopril to ARB Or Stop HCTZ 25mg  daily and start Chlorthalidone 25mg  QD for better 24h coverage.    Alver Sorrow, NP "

## 2022-09-09 ENCOUNTER — Ambulatory Visit (INDEPENDENT_AMBULATORY_CARE_PROVIDER_SITE_OTHER): Payer: 59 | Admitting: Family

## 2022-09-09 ENCOUNTER — Encounter (HOSPITAL_BASED_OUTPATIENT_CLINIC_OR_DEPARTMENT_OTHER): Payer: Self-pay | Admitting: Family

## 2022-09-09 VITALS — BP 175/124 | HR 73 | Ht 69.0 in | Wt 240.2 lb

## 2022-09-09 DIAGNOSIS — I5032 Chronic diastolic (congestive) heart failure: Secondary | ICD-10-CM

## 2022-09-09 DIAGNOSIS — R011 Cardiac murmur, unspecified: Secondary | ICD-10-CM

## 2022-09-09 DIAGNOSIS — I10 Essential (primary) hypertension: Secondary | ICD-10-CM | POA: Diagnosis not present

## 2022-09-09 DIAGNOSIS — R42 Dizziness and giddiness: Secondary | ICD-10-CM | POA: Diagnosis not present

## 2022-09-09 NOTE — Patient Instructions (Signed)
Medication Instructions:  Your physician has recommended you make the following change in your medication:  STOP Hydrochlorothiazide  START Chlorthalidone 25mg  daily *If you need a refill on your cardiac medications before your next appointment, please call your pharmacy*   Follow-Up: At [facility], you and your health needs are our priority.  As part of our continuing mission to provide you with exceptional heart care, we have created designated Provider Care Teams.  These Care Teams include your primary Cardiologist (physician) and Advanced Practice Providers (APPs -  Physician Assistants and Nurse Practitioners) who all work together to provide you with the care you need, when you need it.  We recommend signing up for the patient portal called "MyChart".  Sign up information is provided on this After Visit Summary.  MyChart is used to connect with patients for Virtual Visits (Telemedicine).  Patients are able to view lab/test results, encounter notes, upcoming appointments, etc.  Non-urgent messages can be sent to your provider as well.   To learn more about what you can do with MyChart, go to [email or web address].    Your next appointment:   As scheduled with Dr. Duke Salvia  Other Instructions  Keep checking BP once per day at least an hour after medications
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
## 2022-09-10 DIAGNOSIS — I119 Hypertensive heart disease without heart failure: Secondary | ICD-10-CM | POA: Diagnosis not present

## 2022-09-10 DIAGNOSIS — K219 Gastro-esophageal reflux disease without esophagitis: Secondary | ICD-10-CM | POA: Diagnosis not present

## 2022-09-10 DIAGNOSIS — F418 Other specified anxiety disorders: Secondary | ICD-10-CM | POA: Diagnosis not present

## 2022-09-10 DIAGNOSIS — R42 Dizziness and giddiness: Secondary | ICD-10-CM | POA: Diagnosis not present

## 2022-09-10 DIAGNOSIS — N289 Disorder of kidney and ureter, unspecified: Secondary | ICD-10-CM | POA: Diagnosis not present

## 2022-09-10 DIAGNOSIS — R55 Syncope and collapse: Secondary | ICD-10-CM | POA: Diagnosis not present

## 2022-09-10 DIAGNOSIS — I10 Essential (primary) hypertension: Secondary | ICD-10-CM | POA: Diagnosis not present

## 2022-09-10 DIAGNOSIS — Z0000 Encounter for general adult medical examination without abnormal findings: Secondary | ICD-10-CM | POA: Diagnosis not present

## 2022-09-10 DIAGNOSIS — G43909 Migraine, unspecified, not intractable, without status migrainosus: Secondary | ICD-10-CM | POA: Diagnosis not present

## 2022-09-10 DIAGNOSIS — E039 Hypothyroidism, unspecified: Secondary | ICD-10-CM | POA: Diagnosis not present

## 2022-09-10 DIAGNOSIS — Z131 Encounter for screening for diabetes mellitus: Secondary | ICD-10-CM | POA: Diagnosis not present

## 2022-09-10 DIAGNOSIS — R110 Nausea: Secondary | ICD-10-CM | POA: Diagnosis not present

## 2022-09-11 ENCOUNTER — Encounter (HOSPITAL_BASED_OUTPATIENT_CLINIC_OR_DEPARTMENT_OTHER): Payer: Self-pay | Admitting: Family

## 2022-09-11 ENCOUNTER — Telehealth (HOSPITAL_BASED_OUTPATIENT_CLINIC_OR_DEPARTMENT_OTHER): Payer: Self-pay | Admitting: *Deleted

## 2022-09-11 DIAGNOSIS — F4311 Post-traumatic stress disorder, acute: Secondary | ICD-10-CM | POA: Diagnosis not present

## 2022-09-11 DIAGNOSIS — R42 Dizziness and giddiness: Secondary | ICD-10-CM

## 2022-09-11 DIAGNOSIS — I1A0 Resistant hypertension: Secondary | ICD-10-CM

## 2022-09-11 NOTE — Telephone Encounter (Signed)
Advised patient and labs ordered

## 2022-09-11 NOTE — Telephone Encounter (Signed)
-----   Message from Alver Sorrow, NP sent at 09/11/2022  4:27 PM EDT ----- Hi! Her labs 1 week after starting chlorthalidone, can we have her do her previous secondary HTN labs too? So it would be cortisol, metanephrines, catecholamines, thyroid panel and BMP. TY!

## 2022-09-12 DIAGNOSIS — R42 Dizziness and giddiness: Secondary | ICD-10-CM | POA: Diagnosis not present

## 2022-09-12 DIAGNOSIS — I119 Hypertensive heart disease without heart failure: Secondary | ICD-10-CM | POA: Diagnosis not present

## 2022-09-12 DIAGNOSIS — F418 Other specified anxiety disorders: Secondary | ICD-10-CM | POA: Diagnosis not present

## 2022-09-12 DIAGNOSIS — R110 Nausea: Secondary | ICD-10-CM | POA: Diagnosis not present

## 2022-09-12 DIAGNOSIS — K219 Gastro-esophageal reflux disease without esophagitis: Secondary | ICD-10-CM | POA: Diagnosis not present

## 2022-09-12 DIAGNOSIS — I10 Essential (primary) hypertension: Secondary | ICD-10-CM | POA: Diagnosis not present

## 2022-09-12 DIAGNOSIS — G43909 Migraine, unspecified, not intractable, without status migrainosus: Secondary | ICD-10-CM | POA: Diagnosis not present

## 2022-09-12 DIAGNOSIS — E039 Hypothyroidism, unspecified: Secondary | ICD-10-CM | POA: Diagnosis not present

## 2022-09-12 DIAGNOSIS — N289 Disorder of kidney and ureter, unspecified: Secondary | ICD-10-CM | POA: Diagnosis not present

## 2022-09-12 DIAGNOSIS — R55 Syncope and collapse: Secondary | ICD-10-CM | POA: Diagnosis not present

## 2022-09-16 ENCOUNTER — Encounter: Payer: Self-pay | Admitting: Diagnostic Neuroimaging

## 2022-09-17 ENCOUNTER — Encounter: Payer: Self-pay | Admitting: Diagnostic Neuroimaging

## 2022-09-17 DIAGNOSIS — I1A0 Resistant hypertension: Secondary | ICD-10-CM | POA: Diagnosis not present

## 2022-09-17 DIAGNOSIS — R42 Dizziness and giddiness: Secondary | ICD-10-CM | POA: Diagnosis not present

## 2022-09-18 ENCOUNTER — Ambulatory Visit: Payer: 59 | Attending: Cardiovascular Disease

## 2022-09-18 ENCOUNTER — Encounter: Payer: Self-pay | Admitting: Diagnostic Neuroimaging

## 2022-09-18 DIAGNOSIS — R42 Dizziness and giddiness: Secondary | ICD-10-CM

## 2022-09-18 DIAGNOSIS — R55 Syncope and collapse: Secondary | ICD-10-CM

## 2022-09-19 ENCOUNTER — Other Ambulatory Visit: Payer: 59

## 2022-09-19 DIAGNOSIS — F4311 Post-traumatic stress disorder, acute: Secondary | ICD-10-CM | POA: Diagnosis not present

## 2022-09-20 ENCOUNTER — Encounter (HOSPITAL_BASED_OUTPATIENT_CLINIC_OR_DEPARTMENT_OTHER): Payer: Self-pay | Admitting: *Deleted

## 2022-09-21 ENCOUNTER — Other Ambulatory Visit: Payer: 59

## 2022-09-26 LAB — BASIC METABOLIC PANEL
BUN/Creatinine Ratio: 18 (ref 9–23)
BUN: 17 mg/dL (ref 6–24)
CO2: 24 mmol/L (ref 20–29)
Calcium: 9.3 mg/dL (ref 8.7–10.2)
Chloride: 104 mmol/L (ref 96–106)
Creatinine, Ser: 0.96 mg/dL (ref 0.57–1.00)
Glucose: 86 mg/dL (ref 70–99)
Potassium: 3.7 mmol/L (ref 3.5–5.2)
Sodium: 143 mmol/L (ref 134–144)
eGFR: 71 mL/min/{1.73_m2} (ref >59)

## 2022-09-26 LAB — CATECHOLAMINES, FRACTIONATED, PLASMA
Dopamine: <30 pg/mL (ref 0–48)
Epinephrine: 18 pg/mL (ref 0–62)
Norepinephrine: 218 pg/mL (ref 0–874)

## 2022-09-26 LAB — THYROID PANEL WITH TSH
Free Thyroxine Index: 2.7 (ref 1.2–4.9)
T3 Uptake Ratio: 32 % (ref 24–39)
T4, Total: 8.3 ug/dL (ref 4.5–12.0)
TSH: 0.768 u[IU]/mL (ref 0.450–4.500)

## 2022-09-26 LAB — METANEPHRINES, PLASMA
Metanephrine, Free: <25 pg/mL (ref 0.0–88.0)
Normetanephrine, Free: 26.7 pg/mL (ref 0.0–244.0)

## 2022-09-26 LAB — CORTISOL: Cortisol: 8.1 ug/dL (ref 6.2–19.4)

## 2022-09-27 ENCOUNTER — Telehealth (HOSPITAL_BASED_OUTPATIENT_CLINIC_OR_DEPARTMENT_OTHER): Payer: Self-pay

## 2022-09-27 NOTE — Telephone Encounter (Signed)
09/27/2022 14:55 Results called to patient, will message Luther Parody with patient's question about cause of syncope. Jim Like MHA RN CCM

## 2022-09-27 NOTE — Progress Notes (Signed)
Results called to patient, will message Luther Parody with patient's question about cause of syncope.

## 2022-09-30 ENCOUNTER — Telehealth (HOSPITAL_BASED_OUTPATIENT_CLINIC_OR_DEPARTMENT_OTHER): Payer: Self-pay | Admitting: Cardiovascular Disease

## 2022-09-30 NOTE — Telephone Encounter (Signed)
She is additionally being worked up by neurology related to syncope, dizziness, vertigo and would recommend she continue to follow with neurology.   Her echo 09/03/22 showed no abnormality that would contribute to syncope. Monitor results are presently being reviewed.   The labs from 09/17/22 which results were called for were to assess for secondary hypertension where something else causes hypertension. These were reassuring and prompt [place] to continue medication management.  At last visit 09/09/22 she was recommended to switch hydrochlorothiazide to Chlorthalidone. If we can get updated BP log since that switch.   Alver Sorrow, NP

## 2022-09-30 NOTE — Telephone Encounter (Signed)
[employer] calling to find out of patient is still advised to stay out of work.

## 2022-09-30 NOTE — Telephone Encounter (Signed)
Returned call to patient, reviewed the following recommendations with the patient. Patient verbalizes understanding. Patient reports that her blood pressure has been much better. However, she states that she has felt so bad since starting the medication. She states she isn't even able to function and is having to miss doses of medication if she has something going on that day that requires driving since it just making her feel so badly.   PCP visit bp 122/78  She did see neurology, they wanted her to go to neuro physical therapy but she has been unable to attend because they will not let her start until they have 3 weeks of recorded controlled blood pressures.       "She is additionally being worked up by neurology related to syncope, dizziness, vertigo and would recommend she continue to follow with neurology.    Her echo 09/03/22 showed no abnormality that would contribute to syncope. Monitor results are presently being reviewed.    The labs from 09/17/22 which results were called for were to assess for secondary hypertension where something else causes hypertension. These were reassuring and prompt [place] to continue medication management.   At last visit 09/09/22 she was recommended to switch hydrochlorothiazide to Chlorthalidone. If we can get updated BP log since that switch.    Alver Sorrow, NP"

## 2022-09-30 NOTE — Telephone Encounter (Signed)
Faxed, confirmation received  Left voicemail advising call back if not received

## 2022-09-30 NOTE — Telephone Encounter (Signed)
As discussed

## 2022-09-30 NOTE — Telephone Encounter (Signed)
That BP is looking much better. If she will check BP daily and bring a log we can consider medication adjustments at her follow up 10/10/22. Difficult to address via phone and best addressed via clinic visit.  Juliette Alcide - will you just keep an eye on Dr. Leonides Sake hypertension clinic day this week and if anything opens up call to offer to [name]?  TY! Alver Sorrow, NP

## 2022-09-30 NOTE — Telephone Encounter (Signed)
Returned call to patient, reviewed recommendations from NP. Patient verbalizes understanding and knows we will call if sooner app opens up!

## 2022-10-03 DIAGNOSIS — R42 Dizziness and giddiness: Secondary | ICD-10-CM | POA: Diagnosis not present

## 2022-10-03 DIAGNOSIS — K219 Gastro-esophageal reflux disease without esophagitis: Secondary | ICD-10-CM | POA: Diagnosis not present

## 2022-10-03 DIAGNOSIS — E039 Hypothyroidism, unspecified: Secondary | ICD-10-CM | POA: Diagnosis not present

## 2022-10-03 DIAGNOSIS — I119 Hypertensive heart disease without heart failure: Secondary | ICD-10-CM | POA: Diagnosis not present

## 2022-10-03 DIAGNOSIS — G43909 Migraine, unspecified, not intractable, without status migrainosus: Secondary | ICD-10-CM | POA: Diagnosis not present

## 2022-10-03 DIAGNOSIS — N289 Disorder of kidney and ureter, unspecified: Secondary | ICD-10-CM | POA: Diagnosis not present

## 2022-10-03 DIAGNOSIS — R110 Nausea: Secondary | ICD-10-CM | POA: Diagnosis not present

## 2022-10-03 DIAGNOSIS — F418 Other specified anxiety disorders: Secondary | ICD-10-CM | POA: Diagnosis not present

## 2022-10-03 DIAGNOSIS — I10 Essential (primary) hypertension: Secondary | ICD-10-CM | POA: Diagnosis not present

## 2022-10-03 DIAGNOSIS — R55 Syncope and collapse: Secondary | ICD-10-CM | POA: Diagnosis not present

## 2022-10-04 ENCOUNTER — Other Ambulatory Visit: Payer: Self-pay | Admitting: Physician Assistant

## 2022-10-04 DIAGNOSIS — Z1231 Encounter for screening mammogram for malignant neoplasm of breast: Secondary | ICD-10-CM

## 2022-10-09 ENCOUNTER — Telehealth: Payer: Self-pay

## 2022-10-09 ENCOUNTER — Other Ambulatory Visit (HOSPITAL_COMMUNITY): Payer: Self-pay

## 2022-10-09 DIAGNOSIS — F4311 Post-traumatic stress disorder, acute: Secondary | ICD-10-CM | POA: Diagnosis not present

## 2022-10-09 NOTE — Telephone Encounter (Signed)
Pharmacy Patient Advocate Encounter   Received notification from Physician's Office that prior authorization for Nurtec 75MG  dispersible tablets is required/requested.   Insurance verification completed.   The patient is insured through [facility] .   Per test claim: PA submitted to [employer] [facility] via CoverMyMeds Key/confirmation #/EOC [id number] Status is pending

## 2022-10-10 ENCOUNTER — Encounter (HOSPITAL_BASED_OUTPATIENT_CLINIC_OR_DEPARTMENT_OTHER): Payer: Self-pay | Admitting: Cardiovascular Disease

## 2022-10-10 ENCOUNTER — Other Ambulatory Visit (HOSPITAL_COMMUNITY): Payer: Self-pay

## 2022-10-10 ENCOUNTER — Ambulatory Visit (HOSPITAL_BASED_OUTPATIENT_CLINIC_OR_DEPARTMENT_OTHER): Payer: 59 | Admitting: Cardiovascular Disease

## 2022-10-10 ENCOUNTER — Telehealth: Payer: Self-pay | Admitting: Cardiovascular Disease

## 2022-10-10 VITALS — BP 152/93 | HR 80 | Temp 98.4°F | Ht 69.0 in | Wt 243.4 lb

## 2022-10-10 DIAGNOSIS — R112 Nausea with vomiting, unspecified: Secondary | ICD-10-CM

## 2022-10-10 DIAGNOSIS — G4733 Obstructive sleep apnea (adult) (pediatric): Secondary | ICD-10-CM

## 2022-10-10 DIAGNOSIS — I5032 Chronic diastolic (congestive) heart failure: Secondary | ICD-10-CM | POA: Diagnosis not present

## 2022-10-10 DIAGNOSIS — I1A0 Resistant hypertension: Secondary | ICD-10-CM | POA: Diagnosis not present

## 2022-10-10 DIAGNOSIS — Z5181 Encounter for therapeutic drug level monitoring: Secondary | ICD-10-CM | POA: Diagnosis not present

## 2022-10-10 DIAGNOSIS — R002 Palpitations: Secondary | ICD-10-CM | POA: Diagnosis not present

## 2022-10-10 HISTORY — DX: Palpitations: R00.2

## 2022-10-10 MED ORDER — SPIRONOLACTONE 25 MG PO TABS: 25.0000 mg | ORAL_TABLET | Freq: Every day | ORAL | 3 refills | Status: DC

## 2022-10-10 NOTE — Patient Instructions (Addendum)
Medication Instructions:  STOP LABETALOL   START SPIRONOLACTONE 25 MG DAILY   Labwork: CMET/AMYLASE/LIPASE/SED RATE IN 1 WEEK   Testing/Procedures: NONE  Follow-Up: 11/21/2022 1:30 pm with Ronn Melena NP   You have been referred to GASTROENTEROLOGY  IF YOU DO NOT HEAR FROM THEM IN 2 WEEKS CALL THE OFFICE TO FOLLOW UP   If you need a refill on your cardiac medications before your next appointment, please call your pharmacy.

## 2022-10-10 NOTE — Telephone Encounter (Signed)
Pharmacy Patient Advocate Encounter  Received notification from [employer] that Prior Authorization for Nurtec 75MG  dispersible tablets has been DENIED because see below..    PA #/Case ID/Reference #: PA Case ID: [id number]   Please be advised we currently do not have a Pharmacist to review denials, therefore you will need to process appeals accordingly as needed. Thanks for your support at this time. Contact for appeals are as follows: Phone: [phone], Fax: [phone].  The denial letter has been placed in the chart under the media tab.
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
## 2022-10-10 NOTE — Telephone Encounter (Signed)
Per Dr Duke Salvia patient needs to be seen in office  Advised patient and she will be at her appointment
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
## 2022-10-10 NOTE — Telephone Encounter (Signed)
Patient is requesting to change her 11:00 A.M. appt with Dr. Duke Salvia to a tele appt. Requesting return call.

## 2022-10-14 ENCOUNTER — Telehealth: Payer: Self-pay

## 2022-10-14 ENCOUNTER — Other Ambulatory Visit (HOSPITAL_COMMUNITY): Payer: Self-pay

## 2022-10-14 MED ORDER — QULIPTA 30 MG PO TABS: 30.0000 mg | ORAL_TABLET | Freq: Every day | ORAL | 12 refills | Status: DC

## 2022-10-14 NOTE — Telephone Encounter (Signed)
Must try and fail preferred products of need medical letter of necessity proving why this pt cannot take them

## 2022-10-14 NOTE — Telephone Encounter (Signed)
Received notification from [facility] that Prior Authorization for Qulipta 30MG  tablets has been APPROVED from 10/14/2022 to 01/12/2023.Marland Kitchen  PA #/Case ID/Reference #: PA Case ID: [id number]

## 2022-10-14 NOTE — Telephone Encounter (Signed)
-   change to qulipta (currently lactating, which limits other medication options).  Meds ordered this encounter  Medications   Atogepant (QULIPTA) 30 MG TABS    Sig: Take 1 tablet (30 mg total) by mouth daily.    Dispense:  30 tablet    Refill:  12    Suanne Marker, MD 10/14/2022, 3:06 PM Certified in Neurology, Neurophysiology and Neuroimaging  [facility] [address] [place], [place] [address] [phone]

## 2022-10-14 NOTE — Telephone Encounter (Signed)
Pharmacy Patient Advocate Encounter  Received notification from [facility] that Prior Authorization for Qulipta 30MG  tablets has been APPROVED from 10/14/2022 to 01/12/2023.Marland Kitchen  PA #/Case ID/Reference #: PA Case ID: [id number]

## 2022-10-15 ENCOUNTER — Other Ambulatory Visit (HOSPITAL_COMMUNITY): Payer: Self-pay

## 2022-10-15 NOTE — Telephone Encounter (Signed)
Pharmacy Patient Advocate Encounter  Received notification from [facility] that Prior Authorization for Qulipta 30MG  tablets has been APPROVED from 10-14-2022 to 01-12-2023. Ran test claim, Copay is $n/a [facility] pharmacy not contracted with patient insurance.  PA #/Case ID/Reference #: [id number]

## 2022-10-16 DIAGNOSIS — F4311 Post-traumatic stress disorder, acute: Secondary | ICD-10-CM | POA: Diagnosis not present

## 2022-10-16 NOTE — Telephone Encounter (Signed)
Received notification from [facility] that Prior Authorization for Qulipta 30MG  tablets has been APPROVED from 10-14-2022 to 01-12-2023. Ran test claim, Copay is $n/a [facility] pharmacy not contracted with patient insurance.  PA #/Case ID/Reference #: [id number]

## 2022-10-23 DIAGNOSIS — F4311 Post-traumatic stress disorder, acute: Secondary | ICD-10-CM | POA: Diagnosis not present

## 2022-10-31 ENCOUNTER — Ambulatory Visit (HOSPITAL_BASED_OUTPATIENT_CLINIC_OR_DEPARTMENT_OTHER): Payer: 59 | Admitting: Cardiovascular Disease

## 2022-11-05 ENCOUNTER — Other Ambulatory Visit: Payer: Self-pay | Admitting: Family

## 2022-11-05 DIAGNOSIS — I1A0 Resistant hypertension: Secondary | ICD-10-CM | POA: Diagnosis not present

## 2022-11-13 DIAGNOSIS — F4311 Post-traumatic stress disorder, acute: Secondary | ICD-10-CM | POA: Diagnosis not present

## 2022-11-20 DIAGNOSIS — F4311 Post-traumatic stress disorder, acute: Secondary | ICD-10-CM | POA: Diagnosis not present

## 2022-11-21 ENCOUNTER — Encounter (HOSPITAL_BASED_OUTPATIENT_CLINIC_OR_DEPARTMENT_OTHER): Payer: Self-pay | Admitting: Family

## 2022-11-21 ENCOUNTER — Ambulatory Visit (INDEPENDENT_AMBULATORY_CARE_PROVIDER_SITE_OTHER): Payer: 59 | Admitting: Family

## 2022-11-21 VITALS — BP 163/99 | HR 95 | Ht 69.0 in | Wt 242.0 lb

## 2022-11-21 DIAGNOSIS — R42 Dizziness and giddiness: Secondary | ICD-10-CM | POA: Diagnosis not present

## 2022-11-21 DIAGNOSIS — Z8669 Personal history of other diseases of the nervous system and sense organs: Secondary | ICD-10-CM

## 2022-11-21 DIAGNOSIS — I5032 Chronic diastolic (congestive) heart failure: Secondary | ICD-10-CM

## 2022-11-21 DIAGNOSIS — I1A0 Resistant hypertension: Secondary | ICD-10-CM

## 2022-11-21 MED ORDER — LISINOPRIL 20 MG PO TABS: 20.0000 mg | ORAL_TABLET | Freq: Every day | ORAL | Status: DC

## 2022-11-21 MED ORDER — HYDRALAZINE HCL 100 MG PO TABS
100.0000 mg | ORAL_TABLET | Freq: Three times a day (TID) | ORAL | Status: DC
Start: 2022-11-21 — End: 2023-03-31

## 2022-11-21 NOTE — Patient Instructions (Signed)
Medication Instructions:  Your physician has recommended you make the following change in your medication:   Stop: chlorthalidone   Start: Lisinopril 20mg  daily   Change: Hydralazine 100mg  three times daily    Follow-Up: Follow up as scheduled
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
## 2022-12-04 DIAGNOSIS — F4311 Post-traumatic stress disorder, acute: Secondary | ICD-10-CM | POA: Diagnosis not present

## 2022-12-05 DIAGNOSIS — R55 Syncope and collapse: Secondary | ICD-10-CM | POA: Diagnosis not present

## 2022-12-05 DIAGNOSIS — F418 Other specified anxiety disorders: Secondary | ICD-10-CM | POA: Diagnosis not present

## 2022-12-05 DIAGNOSIS — R42 Dizziness and giddiness: Secondary | ICD-10-CM | POA: Diagnosis not present

## 2022-12-05 DIAGNOSIS — N289 Disorder of kidney and ureter, unspecified: Secondary | ICD-10-CM | POA: Diagnosis not present

## 2022-12-05 DIAGNOSIS — G43909 Migraine, unspecified, not intractable, without status migrainosus: Secondary | ICD-10-CM | POA: Diagnosis not present

## 2022-12-05 DIAGNOSIS — E039 Hypothyroidism, unspecified: Secondary | ICD-10-CM | POA: Diagnosis not present

## 2022-12-05 DIAGNOSIS — K219 Gastro-esophageal reflux disease without esophagitis: Secondary | ICD-10-CM | POA: Diagnosis not present

## 2022-12-05 DIAGNOSIS — I10 Essential (primary) hypertension: Secondary | ICD-10-CM | POA: Diagnosis not present

## 2022-12-05 DIAGNOSIS — I119 Hypertensive heart disease without heart failure: Secondary | ICD-10-CM | POA: Diagnosis not present

## 2022-12-10 NOTE — Progress Notes (Signed)
No show

## 2022-12-13 ENCOUNTER — Encounter: Payer: Self-pay | Admitting: Cardiovascular Disease

## 2022-12-18 DIAGNOSIS — F4311 Post-traumatic stress disorder, acute: Secondary | ICD-10-CM | POA: Diagnosis not present

## 2022-12-20 DIAGNOSIS — G43909 Migraine, unspecified, not intractable, without status migrainosus: Secondary | ICD-10-CM | POA: Diagnosis not present

## 2022-12-20 DIAGNOSIS — R051 Acute cough: Secondary | ICD-10-CM | POA: Diagnosis not present

## 2022-12-20 DIAGNOSIS — E039 Hypothyroidism, unspecified: Secondary | ICD-10-CM | POA: Diagnosis not present

## 2022-12-20 DIAGNOSIS — F418 Other specified anxiety disorders: Secondary | ICD-10-CM | POA: Diagnosis not present

## 2022-12-20 DIAGNOSIS — I10 Essential (primary) hypertension: Secondary | ICD-10-CM | POA: Diagnosis not present

## 2022-12-20 DIAGNOSIS — K219 Gastro-esophageal reflux disease without esophagitis: Secondary | ICD-10-CM | POA: Diagnosis not present

## 2022-12-20 DIAGNOSIS — R42 Dizziness and giddiness: Secondary | ICD-10-CM | POA: Diagnosis not present

## 2022-12-20 DIAGNOSIS — I119 Hypertensive heart disease without heart failure: Secondary | ICD-10-CM | POA: Diagnosis not present

## 2022-12-25 DIAGNOSIS — F4311 Post-traumatic stress disorder, acute: Secondary | ICD-10-CM | POA: Diagnosis not present

## 2022-12-30 ENCOUNTER — Other Ambulatory Visit: Payer: Self-pay | Admitting: Diagnostic Neuroimaging

## 2022-12-30 ENCOUNTER — Encounter (HOSPITAL_BASED_OUTPATIENT_CLINIC_OR_DEPARTMENT_OTHER): Payer: Self-pay

## 2022-12-30 ENCOUNTER — Telehealth (INDEPENDENT_AMBULATORY_CARE_PROVIDER_SITE_OTHER): Payer: 59 | Admitting: Diagnostic Neuroimaging

## 2022-12-30 ENCOUNTER — Encounter: Payer: Self-pay | Admitting: Diagnostic Neuroimaging

## 2022-12-30 DIAGNOSIS — R42 Dizziness and giddiness: Secondary | ICD-10-CM | POA: Diagnosis not present

## 2022-12-30 DIAGNOSIS — I1A0 Resistant hypertension: Secondary | ICD-10-CM | POA: Diagnosis not present

## 2022-12-30 DIAGNOSIS — G43109 Migraine with aura, not intractable, without status migrainosus: Secondary | ICD-10-CM | POA: Diagnosis not present

## 2022-12-30 DIAGNOSIS — R4189 Other symptoms and signs involving cognitive functions and awareness: Secondary | ICD-10-CM

## 2022-12-30 DIAGNOSIS — R5383 Other fatigue: Secondary | ICD-10-CM

## 2022-12-30 MED ORDER — RIZATRIPTAN BENZOATE 10 MG PO TBDP: 10.0000 mg | ORAL_TABLET | ORAL | 11 refills | Status: DC | PRN

## 2022-12-30 MED ORDER — QULIPTA 30 MG PO TABS: 30.0000 mg | ORAL_TABLET | Freq: Every day | ORAL | 12 refills | Status: DC
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
## 2022-12-30 NOTE — Telephone Encounter (Signed)
Stop lisinopril. Start chlorthalidone 25mg  daily. Will repeat BMP on day of her OV.   Ensure taking Amlodipine 10mg  daily, Hydralazine 10mg  three times per day, Spironolactone 25mg  daily.   Alver Sorrow, NP

## 2022-12-30 NOTE — Telephone Encounter (Signed)
Sent to GI again to schedule. They will obtain Drucie Opitz.

## 2022-12-30 NOTE — Telephone Encounter (Signed)
Please review and advise patient is scheduled to see you 10/17

## 2023-01-01 DIAGNOSIS — F4311 Post-traumatic stress disorder, acute: Secondary | ICD-10-CM | POA: Diagnosis not present

## 2023-01-02 DIAGNOSIS — F418 Other specified anxiety disorders: Secondary | ICD-10-CM | POA: Diagnosis not present

## 2023-01-02 DIAGNOSIS — G43909 Migraine, unspecified, not intractable, without status migrainosus: Secondary | ICD-10-CM | POA: Diagnosis not present

## 2023-01-02 DIAGNOSIS — K219 Gastro-esophageal reflux disease without esophagitis: Secondary | ICD-10-CM | POA: Diagnosis not present

## 2023-01-02 DIAGNOSIS — E039 Hypothyroidism, unspecified: Secondary | ICD-10-CM | POA: Diagnosis not present

## 2023-01-02 DIAGNOSIS — R42 Dizziness and giddiness: Secondary | ICD-10-CM | POA: Diagnosis not present

## 2023-01-02 DIAGNOSIS — I10 Essential (primary) hypertension: Secondary | ICD-10-CM | POA: Diagnosis not present

## 2023-01-02 DIAGNOSIS — R053 Chronic cough: Secondary | ICD-10-CM | POA: Diagnosis not present

## 2023-01-02 DIAGNOSIS — I119 Hypertensive heart disease without heart failure: Secondary | ICD-10-CM | POA: Diagnosis not present

## 2023-01-03 ENCOUNTER — Ambulatory Visit
Admission: RE | Admit: 2023-01-03 | Discharge: 2023-01-03 | Disposition: A | Discharge status: Home / Self Care | Payer: 59 | Source: Ambulatory Visit | Attending: Diagnostic Neuroimaging | Admitting: Diagnostic Neuroimaging

## 2023-01-03 DIAGNOSIS — R5383 Other fatigue: Secondary | ICD-10-CM | POA: Diagnosis not present

## 2023-01-03 DIAGNOSIS — R4189 Other symptoms and signs involving cognitive functions and awareness: Secondary | ICD-10-CM | POA: Diagnosis not present

## 2023-01-03 DIAGNOSIS — R42 Dizziness and giddiness: Secondary | ICD-10-CM | POA: Diagnosis not present

## 2023-01-09 ENCOUNTER — Ambulatory Visit (INDEPENDENT_AMBULATORY_CARE_PROVIDER_SITE_OTHER): Payer: 59 | Admitting: Family

## 2023-01-09 ENCOUNTER — Encounter (HOSPITAL_BASED_OUTPATIENT_CLINIC_OR_DEPARTMENT_OTHER): Payer: Self-pay | Admitting: Family

## 2023-01-09 VITALS — BP 167/113 | HR 89 | Ht 69.0 in | Wt 243.7 lb

## 2023-01-09 DIAGNOSIS — I10 Essential (primary) hypertension: Secondary | ICD-10-CM

## 2023-01-09 DIAGNOSIS — R011 Cardiac murmur, unspecified: Secondary | ICD-10-CM

## 2023-01-09 DIAGNOSIS — I5032 Chronic diastolic (congestive) heart failure: Secondary | ICD-10-CM

## 2023-01-09 DIAGNOSIS — R42 Dizziness and giddiness: Secondary | ICD-10-CM

## 2023-01-09 MED ORDER — SPIRONOLACTONE 50 MG PO TABS: 25.0000 mg | ORAL_TABLET | Freq: Every day | ORAL | 3 refills | Status: DC

## 2023-01-09 NOTE — Patient Instructions (Signed)
Medication Instructions:  INCREASE Spironolactone to 50mg  daily   Labwork: BMP In 1-2 weeks    Follow-Up: follow up in 2-3 months with Hypertension Clinic   Special Instructions:   [facility] - [facility] [address] [place], [place] [address] Dr. Aniceto Boss Pulmonology Address: [address], [place], [place] [address] Hours:  Open ? Closes 5?PM Phone: [phone]
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
## 2023-01-14 ENCOUNTER — Telehealth: Payer: Self-pay

## 2023-01-14 NOTE — Telephone Encounter (Signed)
-----   Message from Suanne Marker sent at 01/13/2023  6:31 PM EDT ----- Unremarkable imaging results. Please call patient. Continue current plan. -VRP

## 2023-01-15 DIAGNOSIS — F4311 Post-traumatic stress disorder, acute: Secondary | ICD-10-CM | POA: Diagnosis not present

## 2023-01-22 DIAGNOSIS — F4311 Post-traumatic stress disorder, acute: Secondary | ICD-10-CM | POA: Diagnosis not present

## 2023-01-24 DIAGNOSIS — I10 Essential (primary) hypertension: Secondary | ICD-10-CM | POA: Diagnosis not present

## 2023-01-25 LAB — BASIC METABOLIC PANEL
BUN/Creatinine Ratio: 19 (ref 9–23)
BUN: 20 mg/dL (ref 6–24)
CO2: 25 mmol/L (ref 20–29)
Calcium: 10.2 mg/dL (ref 8.7–10.2)
Chloride: 105 mmol/L (ref 96–106)
Creatinine, Ser: 1.04 mg/dL — ABNORMAL HIGH (ref 0.57–1.00)
Glucose: 95 mg/dL (ref 70–99)
Potassium: 4 mmol/L (ref 3.5–5.2)
Sodium: 145 mmol/L — ABNORMAL HIGH (ref 134–144)
eGFR: 64 mL/min/{1.73_m2} (ref >59)

## 2023-02-03 DIAGNOSIS — E6609 Other obesity due to excess calories: Secondary | ICD-10-CM | POA: Diagnosis not present

## 2023-02-03 DIAGNOSIS — R42 Dizziness and giddiness: Secondary | ICD-10-CM | POA: Diagnosis not present

## 2023-02-03 DIAGNOSIS — Z6834 Body mass index (BMI) 34.0-34.9, adult: Secondary | ICD-10-CM | POA: Diagnosis not present

## 2023-02-03 DIAGNOSIS — E66811 Obesity, class 1: Secondary | ICD-10-CM | POA: Diagnosis not present

## 2023-02-03 DIAGNOSIS — F418 Other specified anxiety disorders: Secondary | ICD-10-CM | POA: Diagnosis not present

## 2023-02-03 DIAGNOSIS — I119 Hypertensive heart disease without heart failure: Secondary | ICD-10-CM | POA: Diagnosis not present

## 2023-02-03 DIAGNOSIS — I10 Essential (primary) hypertension: Secondary | ICD-10-CM | POA: Diagnosis not present

## 2023-02-03 DIAGNOSIS — K219 Gastro-esophageal reflux disease without esophagitis: Secondary | ICD-10-CM | POA: Diagnosis not present

## 2023-02-03 DIAGNOSIS — G43909 Migraine, unspecified, not intractable, without status migrainosus: Secondary | ICD-10-CM | POA: Diagnosis not present

## 2023-02-03 DIAGNOSIS — E039 Hypothyroidism, unspecified: Secondary | ICD-10-CM | POA: Diagnosis not present

## 2023-02-05 DIAGNOSIS — F4311 Post-traumatic stress disorder, acute: Secondary | ICD-10-CM | POA: Diagnosis not present

## 2023-02-06 ENCOUNTER — Other Ambulatory Visit (HOSPITAL_COMMUNITY): Payer: Self-pay

## 2023-02-06 ENCOUNTER — Telehealth: Payer: Self-pay

## 2023-02-06 NOTE — Telephone Encounter (Signed)
*  [facility]  Pharmacy Patient Advocate Encounter  Received notification from [facility] that Prior Authorization for Qulipta 30MG  tablets  has been APPROVED from 02/06/2023 to 02/06/2024. Ran test claim, Copay is $[facility] pharmacy not contracted. This test claim was processed through [facility]- copay amounts may vary at other pharmacies due to pharmacy/plan contracts, or as the patient moves through the different stages of their insurance plan.   PA #/Case ID/Reference #: [id number]

## 2023-02-13 ENCOUNTER — Telehealth (HOSPITAL_BASED_OUTPATIENT_CLINIC_OR_DEPARTMENT_OTHER): Payer: Self-pay | Admitting: *Deleted

## 2023-02-13 NOTE — Telephone Encounter (Signed)
Late entry--- On 11/4 faxed [employer] long term disability claim for signed by Dr Duke Salvia  Confirmation received

## 2023-02-19 ENCOUNTER — Other Ambulatory Visit (HOSPITAL_BASED_OUTPATIENT_CLINIC_OR_DEPARTMENT_OTHER): Payer: Self-pay | Admitting: Family

## 2023-02-19 DIAGNOSIS — I10 Essential (primary) hypertension: Secondary | ICD-10-CM

## 2023-02-19 DIAGNOSIS — F4311 Post-traumatic stress disorder, acute: Secondary | ICD-10-CM | POA: Diagnosis not present

## 2023-02-26 DIAGNOSIS — F4311 Post-traumatic stress disorder, acute: Secondary | ICD-10-CM | POA: Diagnosis not present

## 2023-03-04 ENCOUNTER — Encounter: Payer: Self-pay | Admitting: Nurse Practitioner

## 2023-03-04 ENCOUNTER — Ambulatory Visit (INDEPENDENT_AMBULATORY_CARE_PROVIDER_SITE_OTHER): Payer: 59 | Admitting: Nurse Practitioner

## 2023-03-04 ENCOUNTER — Ambulatory Visit: Payer: 59

## 2023-03-04 VITALS — BP 138/97 | HR 83 | Ht 69.0 in | Wt 238.0 lb

## 2023-03-04 DIAGNOSIS — R053 Chronic cough: Secondary | ICD-10-CM

## 2023-03-04 DIAGNOSIS — J4530 Mild persistent asthma, uncomplicated: Secondary | ICD-10-CM | POA: Diagnosis not present

## 2023-03-04 DIAGNOSIS — R0609 Other forms of dyspnea: Secondary | ICD-10-CM | POA: Diagnosis not present

## 2023-03-04 LAB — POCT EXHALED NITRIC OXIDE: FeNO level (ppb): 16
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
## 2023-03-04 NOTE — Patient Instructions (Addendum)
Continue Albuterol inhaler 2 puffs every 6 hours as needed for shortness of breath or wheezing. Notify if symptoms persist despite rescue inhaler/neb use.  Continue pantoprazole 1 tab daily  Start flonase nasal spray 1-2 sprays each nostril daily Restart Symbicort 2 puffs Twice daily. Brush tongue and rinse mouth afterwards   Stay of lisinopril  Attend follow up with neurology   Labs and x ray today  We will probably order a CT of your chest if symptoms persist and chest x ray is normal  We may also consider cardiopulmonary exercise testing if workup is unrevealing  Return in 6-8 weeks after PFT with Dr. Francine Graven or Dr. Celine Mans. If symptoms do not improve or worsen, please contact office for sooner follow up or seek emergency care.

## 2023-03-05 DIAGNOSIS — F4311 Post-traumatic stress disorder, acute: Secondary | ICD-10-CM | POA: Diagnosis not present

## 2023-03-06 ENCOUNTER — Encounter: Payer: Self-pay | Admitting: Nurse Practitioner

## 2023-03-17 DIAGNOSIS — F4311 Post-traumatic stress disorder, acute: Secondary | ICD-10-CM | POA: Diagnosis not present

## 2023-03-24 DIAGNOSIS — Z0100 Encounter for examination of eyes and vision without abnormal findings: Secondary | ICD-10-CM | POA: Diagnosis not present

## 2023-03-27 ENCOUNTER — Encounter (HOSPITAL_BASED_OUTPATIENT_CLINIC_OR_DEPARTMENT_OTHER): Payer: 59 | Admitting: Family
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
## 2023-03-29 ENCOUNTER — Other Ambulatory Visit (HOSPITAL_BASED_OUTPATIENT_CLINIC_OR_DEPARTMENT_OTHER): Payer: Self-pay | Admitting: Cardiovascular Disease

## 2023-03-29 DIAGNOSIS — I1A0 Resistant hypertension: Secondary | ICD-10-CM

## 2023-04-02 DIAGNOSIS — F4311 Post-traumatic stress disorder, acute: Secondary | ICD-10-CM | POA: Diagnosis not present

## 2023-04-09 DIAGNOSIS — F4311 Post-traumatic stress disorder, acute: Secondary | ICD-10-CM | POA: Diagnosis not present

## 2023-05-07 ENCOUNTER — Ambulatory Visit: Payer: 59 | Admitting: Internal Medicine

## 2023-05-27 DIAGNOSIS — F4311 Post-traumatic stress disorder, acute: Secondary | ICD-10-CM | POA: Diagnosis not present

## 2023-06-10 DIAGNOSIS — F4311 Post-traumatic stress disorder, acute: Secondary | ICD-10-CM | POA: Diagnosis not present

## 2023-06-19 DIAGNOSIS — F4311 Post-traumatic stress disorder, acute: Secondary | ICD-10-CM | POA: Diagnosis not present

## 2023-06-26 ENCOUNTER — Encounter: Payer: 59 | Admitting: Internal Medicine

## 2023-06-26 ENCOUNTER — Ambulatory Visit: Payer: 59 | Admitting: Internal Medicine

## 2023-07-01 DIAGNOSIS — F4311 Post-traumatic stress disorder, acute: Secondary | ICD-10-CM | POA: Diagnosis not present

## 2023-07-02 DIAGNOSIS — F4311 Post-traumatic stress disorder, acute: Secondary | ICD-10-CM | POA: Diagnosis not present

## 2023-07-08 ENCOUNTER — Ambulatory Visit: Payer: 59 | Admitting: Internal Medicine

## 2023-07-09 DIAGNOSIS — F4311 Post-traumatic stress disorder, acute: Secondary | ICD-10-CM | POA: Diagnosis not present

## 2023-07-16 DIAGNOSIS — F4311 Post-traumatic stress disorder, acute: Secondary | ICD-10-CM | POA: Diagnosis not present

## 2023-08-06 DIAGNOSIS — F4311 Post-traumatic stress disorder, acute: Secondary | ICD-10-CM | POA: Diagnosis not present

## 2023-12-25 ENCOUNTER — Other Ambulatory Visit: Payer: Self-pay

## 2023-12-25 ENCOUNTER — Ambulatory Visit (INDEPENDENT_AMBULATORY_CARE_PROVIDER_SITE_OTHER): Payer: Self-pay | Admitting: Allergy

## 2023-12-25 ENCOUNTER — Encounter: Payer: Self-pay | Admitting: Allergy

## 2023-12-25 VITALS — BP 110/70 | HR 81 | Temp 98.0°F | Resp 16 | Ht 66.5 in | Wt 213.8 lb

## 2023-12-25 DIAGNOSIS — T783XXA Angioneurotic edema, initial encounter: Secondary | ICD-10-CM

## 2023-12-25 DIAGNOSIS — L509 Urticaria, unspecified: Secondary | ICD-10-CM | POA: Diagnosis not present

## 2023-12-25 MED ORDER — FAMOTIDINE 20 MG PO TABS: 20.0000 mg | ORAL_TABLET | Freq: Two times a day (BID) | ORAL | 2 refills | Status: DC

## 2023-12-25 MED ORDER — PREDNISONE 10 MG PO TABS: ORAL_TABLET | ORAL | 0 refills | Status: DC
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
## 2023-12-25 NOTE — Patient Instructions (Addendum)
 Hives/swelling: .Based on clinical history, she likely has chronic idiopathic urticaria. Discussed with patient, that urticaria is usually caused by release of histamine by cutaneous mast cells but sometimes it is non-histamine mediated. Explained that urticaria is not always associated with allergies. In most cases, the exact etiology for urticaria can not be established and it is considered idiopathic.  Start prednisone  taper. Prednisone  10mg  tablets - take 2 tablets for 4 days then 1 tablet on day 5.  Start zyrtec (cetirizine) 10mg  OR allegra (fexofenadine) 180mg  twice a day. If symptoms are not controlled or causes drowsiness let us  know. Start Pepcid (famotidine) 20mg  twice a day.  Avoid the following potential triggers: alcohol, tight clothing, NSAIDs, hot showers and getting overheated. See below for proper skin care.   Get bloodwork. We are ordering labs, so please allow 1-2 weeks for the results to come back. With the newly implemented Cures Act, the labs might be visible to you at the same time that they become visible to me. However, I will not address the results until all of the results are back, so please be patient.   Return in about 4 weeks (around 01/22/2024). Or sooner if needed.   Skin care recommendations  Bath time: Always use lukewarm water . AVOID very hot or cold water . Keep bathing time to 5-10 minutes. Do NOT use bubble bath. Use a mild soap and use just enough to wash the dirty areas. Do NOT scrub skin vigorously.  After bathing, pat dry your skin with a towel. Do NOT rub or scrub the skin.  Moisturizers and prescriptions:  ALWAYS apply moisturizers immediately after bathing (within 3 minutes). This helps to lock-in moisture. Use the moisturizer several times a day over the whole body. Good summer moisturizers include: Aveeno, CeraVe, Cetaphil. Good winter moisturizers include: Aquaphor, Vaseline, Cerave, Cetaphil, Eucerin, Vanicream. When using moisturizers  along with medications, the moisturizer should be applied about one hour after applying the medication to prevent diluting effect of the medication or moisturize around where you applied the medications. When not using medications, the moisturizer can be continued twice daily as maintenance.  Laundry and clothing: Avoid laundry products with added color or perfumes. Use unscented hypo-allergenic laundry products such as Tide free, Cheer free & gentle, and All free and clear.  If the skin still seems dry or sensitive, you can try double-rinsing the clothes. Avoid tight or scratchy clothing such as wool. Do not use fabric softeners or dyer sheets.

## 2024-01-07 ENCOUNTER — Other Ambulatory Visit: Payer: Self-pay | Admitting: Physician Assistant

## 2024-01-07 DIAGNOSIS — N644 Mastodynia: Secondary | ICD-10-CM

## 2024-01-07 DIAGNOSIS — N6452 Nipple discharge: Secondary | ICD-10-CM

## 2024-01-11 ENCOUNTER — Ambulatory Visit: Payer: Self-pay | Admitting: Allergy

## 2024-01-11 LAB — PROTEIN ELECTROPHORESIS, SERUM
A/G Ratio: 1.2 (ref 0.7–1.7)
Albumin ELP: 3.8 g/dL (ref 2.9–4.4)
Alpha 1: 0.2 g/dL (ref 0.0–0.4)
Alpha 2: 0.7 g/dL (ref 0.4–1.0)
Beta: 1 g/dL (ref 0.7–1.3)
Gamma Globulin: 1.3 g/dL (ref 0.4–1.8)
Globulin, Total: 3.1 g/dL (ref 2.2–3.9)

## 2024-01-11 LAB — C3 AND C4
Complement C3, Serum: 152 mg/dL (ref 82–167)
Complement C4, Serum: 36 mg/dL (ref 12–38)

## 2024-01-11 LAB — PROTEIN ELECTROPHORESIS, URINE REFLEX
Albumin ELP, Urine: 58.5 %
Alpha-1-Globulin, U: 4.2 %
Alpha-2-Globulin, U: 8.4 %
Beta Globulin, U: 13.5 %
Gamma Globulin, U: 15.5 %
Protein, Ur: 36 mg/dL

## 2024-01-11 LAB — COMPREHENSIVE METABOLIC PANEL WITH GFR
AST: 14 IU/L (ref 0–40)
Albumin: 4.4 g/dL (ref 3.8–4.9)
Alkaline Phosphatase: 128 IU/L (ref 49–135)
BUN/Creatinine Ratio: 17 (ref 9–23)
BUN: 19 mg/dL (ref 6–24)
Bilirubin Total: 0.3 mg/dL (ref 0.0–1.2)
CO2: 23 mmol/L (ref 20–29)
Calcium: 9.6 mg/dL (ref 8.7–10.2)
Chloride: 104 mmol/L (ref 96–106)
Creatinine, Ser: 1.11 mg/dL — ABNORMAL HIGH (ref 0.57–1.00)
Globulin, Total: 2.5 g/dL (ref 1.5–4.5)
Glucose: 114 mg/dL — ABNORMAL HIGH (ref 70–99)
Potassium: 4.1 mmol/L (ref 3.5–5.2)
Sodium: 141 mmol/L (ref 134–144)
Total Protein: 6.9 g/dL (ref 6.0–8.5)
eGFR: 59 mL/min/1.73 — ABNORMAL LOW (ref >59)

## 2024-01-11 LAB — CHRONIC URTICARIA (CU) EVAL
ALT: 11 IU/L (ref 0–32)
Basophils Absolute: 0 x10E3/uL (ref 0.0–0.2)
Basos: 0 %
CRP: 2 mg/L (ref 0–10)
EOS (ABSOLUTE): 0.1 x10E3/uL (ref 0.0–0.4)
Eos: 1 %
Hematocrit: 41.2 % (ref 34.0–46.6)
Hemoglobin: 13.5 g/dL (ref 11.1–15.9)
Immature Grans (Abs): 0 x10E3/uL (ref 0.0–0.1)
Immature Granulocytes: 0 %
Lymphocytes Absolute: 1.4 x10E3/uL (ref 0.7–3.1)
Lymphs: 18 %
MCH: 31.5 pg (ref 26.6–33.0)
MCHC: 32.8 g/dL (ref 31.5–35.7)
MCV: 96 fL (ref 79–97)
Monocytes Absolute: 0.3 x10E3/uL (ref 0.1–0.9)
Monocytes: 4 %
Neutrophils Absolute: 6 x10E3/uL (ref 1.4–7.0)
Neutrophils: 77 %
Platelets: 252 x10E3/uL (ref 150–450)
Pooled Donor- BAT CU: 3.78 % (ref 0.00–10.60)
RBC: 4.28 x10E6/uL (ref 3.77–5.28)
RDW: 12.7 % (ref 11.7–15.4)
Sed Rate: 15 mm/h (ref 0–40)
TSH: 0.563 u[IU]/mL (ref 0.450–4.500)
Thyroperoxidase Ab SerPl-aCnc: 12 [IU]/mL (ref 0–34)
WBC: 7.9 x10E3/uL (ref 3.4–10.8)

## 2024-01-11 LAB — ALPHA-GAL PANEL
Allergen Lamb IgE: <0.1 kU/L
Beef IgE: <0.1 kU/L
IgE (Immunoglobulin E), Serum: 66 [IU]/mL (ref 6–495)
O215-IgE Alpha-Gal: <0.1 kU/L
Pork IgE: <0.1 kU/L

## 2024-01-11 LAB — TRYPTASE: Tryptase: 6.7 ug/L (ref 2.2–13.2)

## 2024-01-11 LAB — ANTINUCLEAR ANTIBODIES, IFA: ANA Titer 1: NEGATIVE

## 2024-01-20 NOTE — Progress Notes (Unsigned)
 Follow Up Note  RE: April Dyer MRN: [id number] DOB: 01/24/1971 Date of Office Visit: 01/21/2024  Referring provider: Rosalea Rosina LOISE, [place] Primary care provider: Rosalea Rosina [place], [place]  Chief Complaint: Urticaria  History of Present Illness: I had the pleasure of seeing April Dyer for a follow up visit at the [facility] on 01/21/2024. She is a 53 y.o. female, who is being followed for chronic urticaria with angioedema. Her previous allergy office visit was on 12/25/2023 with Dr. Luke. Today is a regular follow up visit.  Discussed the use of AI scribe software for clinical note transcription with the patient, who gave verbal consent to proceed.    She experiences dizziness and slight vision changes, which occurred while driving to the appointment. She describes the sensation as 'everything kind of like shifted' and initially thought it was related to her glasses. Her husband was in the car and planned to drive her home due to these symptoms.  She has a history of hypertension, which has been difficult to control since having COVID-19. She is currently on multiple medications for hypertension and PCP is aware of her BP issues.   She is taking Sudafed, as advised by a pharmacist, to help with weaning off breastfeeding. She takes two Sudafed twice a day. She is also taking Zyrtec and famotidine for hives, which have resolved completely. No flare-ups or itching as long as she adheres to her medication regimen. She takes Zyrtec and famotidine daily and notes that missing doses results in the return of itching and hives.  She is in the process of weaning from breastfeeding and experienced mastitis, which led to a mammogram being scheduled. She reported expressing blood instead of milk, prompting further investigation by her OB/GYN. She has an upcoming mammogram scheduled for the fourth.  She mentions a decrease in water  intake, opting to drink tea  instead.      2025 labs: Blood count, liver function, electrolytes, thyroid , autoimmune screener, inflammation markers, chronic urticaria index (checks for autoantibodies that trigger mast cells), tryptase (checks for mast cell issues) and alpha gal (checks for red meat allergy) were all normal which is great. Urine test was normal. Kidney function was slightly higher than normal. No further work up needed based on these results.    Most likely have chronic idiopathic urticaria with no triggers. Continue medications as discussed at last visit.  Assessment and Plan: April Dyer is a 53 y.o. female with: Chronic idiopathic urticaria and recurrent angioedema Past history - Chronic idiopathic urticaria with recurrent angioedema x 2-3 months, affecting multiple body areas. Symptoms include itching and rash without identified allergic trigger. Current diphenhydramine  management inadequate. Currently breastfeeding. 2025 urticaria panel unremarkable.  Interim history - resolved with daily zyrtec and famotidine. Noticed some itching if she forgets to take medications twice a day.  Based on clinical history, she likely has chronic idiopathic urticaria. Discussed with patient, that urticaria is usually caused by release of histamine by cutaneous mast cells but sometimes it is non-histamine mediated. Explained that urticaria is not always associated with allergies. In most cases, the exact etiology for urticaria can not be established and it is considered idiopathic. Continue zyrtec (cetirizine) 10mg  twice a day. If symptoms are not controlled or causes drowsiness let us  know. Continue Pepcid (famotidine) 20mg  twice a day.  Avoid the following potential triggers: alcohol, tight clothing, NSAIDs, hot showers and getting overheated. Step down schedule - start in December.  Decrease Pepcid (famotidine) 20mg  to once  a day. Continue zyrtec (cetirizine) 10mg  twice a day. If no symptoms for 2 weeks then: Decrease  zyrtec 10mg  to once a day. Continue Pepcid 20mg  once a day. If no symptoms for 2 weeks then: Stop Pepcid 20mg . Continue zyrtec 10mg  once a day. If no symptoms for 2 weeks then: Stop zyrtec 10mg .  If you get symptoms then go back to the dose where you didn't have any symptoms.   Elevated blood pressure reading Patient is aware of her high blood pressure which may be worse today due to her sudafed use.  Please follow up with PCP regarding this.   Creatinine was slightly elevated.  Increase water  intake and have PCP recheck this. Stop taking sudafed as it can make the blood pressure go up even more.  Keep mammogram and ob/gyn appointment regarding breast discharge issues.  Return in about 6 months (around 07/21/2024).  Meds ordered this encounter  Medications   famotidine (PEPCID) 20 MG tablet    Sig: Take 1 tablet (20 mg total) by mouth 2 (two) times daily.    Dispense:  60 tablet    Refill:  5   Lab Orders  No laboratory test(s) ordered today    Diagnostics: None.   Medication List:  Current Outpatient Medications  Medication Sig Dispense Refill   albuterol  (VENTOLIN HFA) 108 (90 Base) MCG/ACT inhaler Inhale 2 puffs into the lungs every 6 (six) hours as needed for wheezing or shortness of breath.     chlorthalidone  (HYGROTON ) 25 MG tablet Take 25 mg by mouth daily.     hydrALAZINE  (APRESOLINE ) 100 MG tablet TAKE 1 TABLET (100 MG TOTAL) BY MOUTH IN THE MORNING AND AT BEDTIME. 180 tablet 1   ibuprofen  (ADVIL ) 800 MG tablet Take 1 tablet (800 mg total) by mouth every 8 (eight) hours as needed for moderate pain. 30 tablet 11   levothyroxine  (SYNTHROID , LEVOTHROID) 175 MCG tablet Take 175 mcg by mouth daily before breakfast.     meclizine  (ANTIVERT ) 25 MG tablet Take 1 tablet (25 mg total) by mouth 3 (three) times daily as needed for dizziness. 30 tablet 0   pantoprazole  (PROTONIX ) 40 MG tablet Take 40 mg by mouth daily.     SIMPLY SALINE NA Place 1 spray into the nose 3 (three)  times daily as needed (congestion).     famotidine (PEPCID) 20 MG tablet Take 1 tablet (20 mg total) by mouth 2 (two) times daily. 60 tablet 5   No current facility-administered medications for this visit.   Allergies: No Known Allergies I reviewed her past medical history, social history, family history, and environmental history and no significant changes have been reported from her previous visit.  Review of Systems  Constitutional:  Negative for appetite change, chills, fever and unexpected weight change.  HENT:  Negative for congestion and rhinorrhea.   Eyes:  Negative for itching.  Respiratory:  Negative for cough, chest tightness, shortness of breath and wheezing.   Cardiovascular:  Negative for chest pain.  Gastrointestinal:  Negative for abdominal pain.  Genitourinary:  Negative for difficulty urinating.  Skin:  Positive for rash.  Neurological:  Positive for dizziness and headaches.    Objective: BP (!) 150/110   Pulse 90   Temp 98.1 F (36.7 C)   Ht 5' 6.5 (1.689 m)   Wt 209 lb (94.8 kg)   SpO2 97%   BMI 33.23 kg/m  Body mass index is 33.23 kg/m. Physical Exam Vitals and nursing note reviewed.  Constitutional:  Appearance: Normal appearance. She is well-developed.  HENT:     Head: Normocephalic and atraumatic.     Right Ear: Tympanic membrane and external ear normal.     Left Ear: Tympanic membrane and external ear normal.     Nose: Nose normal.     Mouth/Throat:     Mouth: Mucous membranes are moist.     Pharynx: Oropharynx is clear.  Eyes:     Conjunctiva/sclera: Conjunctivae normal.  Cardiovascular:     Rate and Rhythm: Normal rate and regular rhythm.     Heart sounds: Normal heart sounds. No murmur heard.    No friction rub. No gallop.  Pulmonary:     Effort: Pulmonary effort is normal.     Breath sounds: Normal breath sounds. No wheezing, rhonchi or rales.  Musculoskeletal:     Cervical back: Neck supple.  Skin:    General: Skin is warm.      Findings: No rash.  Neurological:     Mental Status: She is alert and oriented to person, place, and time.  Psychiatric:        Behavior: Behavior normal.    Previous notes and tests were reviewed. The plan was reviewed with the patient/family, and all questions/concerned were addressed.  It was my pleasure to see April Dyer today and participate in her care. Please feel free to contact me with any questions or concerns.  Sincerely,  Orlan Cramp, DO Allergy & Immunology  [facility]  [facility] office: [phone] [facility] office: [phone]

## 2024-01-21 ENCOUNTER — Ambulatory Visit (INDEPENDENT_AMBULATORY_CARE_PROVIDER_SITE_OTHER): Admitting: Allergy

## 2024-01-21 ENCOUNTER — Encounter: Payer: Self-pay | Admitting: Allergy

## 2024-01-21 VITALS — BP 150/110 | HR 90 | Temp 98.1°F | Ht 66.5 in | Wt 209.0 lb

## 2024-01-21 DIAGNOSIS — T783XXD Angioneurotic edema, subsequent encounter: Secondary | ICD-10-CM

## 2024-01-21 DIAGNOSIS — R030 Elevated blood-pressure reading, without diagnosis of hypertension: Secondary | ICD-10-CM

## 2024-01-21 DIAGNOSIS — L501 Idiopathic urticaria: Secondary | ICD-10-CM | POA: Diagnosis not present

## 2024-01-21 MED ORDER — FAMOTIDINE 20 MG PO TABS: 20.0000 mg | ORAL_TABLET | Freq: Two times a day (BID) | ORAL | 5 refills | Status: AC

## 2024-01-21 NOTE — Patient Instructions (Addendum)
 Hives/swelling: Based on clinical history, she likely has chronic idiopathic urticaria. Discussed with patient, that urticaria is usually caused by release of histamine by cutaneous mast cells but sometimes it is non-histamine mediated. Explained that urticaria is not always associated with allergies. In most cases, the exact etiology for urticaria can not be established and it is considered idiopathic.  Continue zyrtec (cetirizine) 10mg  twice a day. If symptoms are not controlled or causes drowsiness let us  know. Continue Pepcid (famotidine) 20mg  twice a day.  Avoid the following potential triggers: alcohol, tight clothing, NSAIDs, hot showers and getting overheated.  Starting in December Step down schedule: Decrease Pepcid (famotidine) 20mg  to once a day. Continue zyrtec (cetirizine) 10mg  twice a day. If no symptoms for 2 weeks then: Decrease zyrtec 10mg  to once a day. Continue Pepcid 20mg  once a day. If no symptoms for 2 weeks then: Stop Pepcid 20mg . Continue zyrtec 10mg  once a day. If no symptoms for 2 weeks then: Stop zyrtec 10mg .  If you get symptoms then go back to the dose where you didn't have any symptoms.   Elevated blood pressure  Blood pressure reading was high in our office today. Vitals:   01/21/24 0843  BP: (!) 180/100   Please follow up with PCP regarding this.   Creatinine was slightly elevated.  Increase water  intake and have PCP recheck this.  Stop taking sudafed as it can make the blood pressure go up even more.  Return in about 6 months (around 07/21/2024). Or sooner if needed.

## 2024-01-28 ENCOUNTER — Encounter: Payer: Self-pay | Admitting: Emergency Medicine

## 2024-01-28 ENCOUNTER — Ambulatory Visit: Admission: EM | Admit: 2024-01-28 | Discharge: 2024-01-28 | Disposition: A | Discharge status: Home / Self Care

## 2024-01-28 ENCOUNTER — Ambulatory Visit

## 2024-01-28 DIAGNOSIS — J209 Acute bronchitis, unspecified: Secondary | ICD-10-CM | POA: Diagnosis not present

## 2024-01-28 DIAGNOSIS — R051 Acute cough: Secondary | ICD-10-CM

## 2024-01-28 DIAGNOSIS — R202 Paresthesia of skin: Secondary | ICD-10-CM | POA: Insufficient documentation

## 2024-01-28 DIAGNOSIS — R141 Gas pain: Secondary | ICD-10-CM | POA: Insufficient documentation

## 2024-01-28 DIAGNOSIS — N946 Dysmenorrhea, unspecified: Secondary | ICD-10-CM | POA: Insufficient documentation

## 2024-01-28 DIAGNOSIS — K5900 Constipation, unspecified: Secondary | ICD-10-CM | POA: Insufficient documentation

## 2024-01-28 DIAGNOSIS — J302 Other seasonal allergic rhinitis: Secondary | ICD-10-CM | POA: Insufficient documentation

## 2024-01-28 MED ORDER — BENZONATATE 100 MG PO CAPS: 100.0000 mg | ORAL_CAPSULE | Freq: Three times a day (TID) | ORAL | 0 refills | Status: AC

## 2024-01-28 MED ORDER — GUAIFENESIN ER 600 MG PO TB12: 600.0000 mg | ORAL_TABLET | Freq: Two times a day (BID) | ORAL | 0 refills | Status: AC

## 2024-01-28 MED ORDER — PREDNISONE 50 MG PO TABS
ORAL_TABLET | ORAL | 0 refills | Status: AC
Start: 2024-01-28 — End: ?

## 2024-01-28 NOTE — ED Triage Notes (Signed)
 Pt reports productive cough x3 weeks. Intermittent dizziness x1 week. 3-4 episodes/day. Reduced appetite, some episodes are occurring with the coughing fits. Denies fevers, sore throat, and nasal congestion. Also reports midback pain x1 week that she believes is associated with constant coughing. 2 children recently diagnosed with pneumonia this week. Delsym, mucinex, mucinex-dm, xyzal, goody powders, and ibuprofen  taken with no relief. Using humidifier with no relief.
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
## 2024-01-30 ENCOUNTER — Ambulatory Visit: Payer: Self-pay | Admitting: Nurse Practitioner

## 2024-02-16 ENCOUNTER — Encounter (HOSPITAL_BASED_OUTPATIENT_CLINIC_OR_DEPARTMENT_OTHER)

## 2024-02-20 ENCOUNTER — Telehealth: Payer: Self-pay

## 2024-02-20 NOTE — Telephone Encounter (Signed)
 Pharmacy Patient Advocate Encounter   Received notification from CoverMyMeds that prior authorization for Qulipta  30MG  tablets is due for renewal.   Insurance verification completed.   The patient is insured through [employer].  Action: Patient hasn't been seen in your office in over a year. Plan requires updated chart notes for PA renewal.

## 2024-02-27 ENCOUNTER — Encounter (INDEPENDENT_AMBULATORY_CARE_PROVIDER_SITE_OTHER): Payer: Self-pay

## 2024-02-27 ENCOUNTER — Encounter: Payer: Self-pay | Admitting: Nurse Practitioner

## 2024-02-27 ENCOUNTER — Telehealth: Admitting: Nurse Practitioner

## 2024-02-27 DIAGNOSIS — R0609 Other forms of dyspnea: Secondary | ICD-10-CM

## 2024-02-27 DIAGNOSIS — I10 Essential (primary) hypertension: Secondary | ICD-10-CM | POA: Diagnosis not present

## 2024-02-27 DIAGNOSIS — H8190 Unspecified disorder of vestibular function, unspecified ear: Secondary | ICD-10-CM

## 2024-02-27 DIAGNOSIS — R490 Dysphonia: Secondary | ICD-10-CM

## 2024-02-27 DIAGNOSIS — J45909 Unspecified asthma, uncomplicated: Secondary | ICD-10-CM | POA: Diagnosis not present

## 2024-02-27 DIAGNOSIS — J383 Other diseases of vocal cords: Secondary | ICD-10-CM

## 2024-02-27 DIAGNOSIS — R053 Chronic cough: Secondary | ICD-10-CM

## 2024-02-27 DIAGNOSIS — J309 Allergic rhinitis, unspecified: Secondary | ICD-10-CM

## 2024-02-27 DIAGNOSIS — J4530 Mild persistent asthma, uncomplicated: Secondary | ICD-10-CM

## 2024-02-27 DIAGNOSIS — E6609 Other obesity due to excess calories: Secondary | ICD-10-CM

## 2024-02-27 MED ORDER — BUDESONIDE-FORMOTEROL FUMARATE 160-4.5 MCG/ACT IN AERO: 2.0000 | INHALATION_SPRAY | Freq: Two times a day (BID) | RESPIRATORY_TRACT | 11 refills | Status: AC
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
## 2024-02-27 NOTE — Patient Instructions (Addendum)
 Continue Albuterol  inhaler 2 puffs every 6 hours as needed for shortness of breath or wheezing. Continue pantoprazole  1 tab daily Continue famotidine   Continue flonase  nasal spray 1-2 sprays each nostril daily Restart Symbicort  2 puffs Twice daily. Brush tongue and rinse mouth afterwards    Attend follow up with your heart doctor due to the elevated blood pressure    Ordered lung function testing  Can decide on further testing after your lung function testing  Referral to Ear, Nose, Throat    Return in 6-8 weeks after PFT with any new pulmonologist or you can back to Dr. Jude at the [facility]. If symptoms do not improve or worsen, please contact office for sooner follow up or seek emergency care.

## 2024-04-05 ENCOUNTER — Institutional Professional Consult (permissible substitution) (INDEPENDENT_AMBULATORY_CARE_PROVIDER_SITE_OTHER)

## 2024-04-16 ENCOUNTER — Telehealth (INDEPENDENT_AMBULATORY_CARE_PROVIDER_SITE_OTHER): Payer: Self-pay

## 2024-04-16 ENCOUNTER — Encounter (INDEPENDENT_AMBULATORY_CARE_PROVIDER_SITE_OTHER): Payer: Self-pay

## 2024-04-16 NOTE — Telephone Encounter (Signed)
 Lvm to rs provider not in office

## 2024-05-03 ENCOUNTER — Institutional Professional Consult (permissible substitution) (INDEPENDENT_AMBULATORY_CARE_PROVIDER_SITE_OTHER)

## 2024-06-02 ENCOUNTER — Institutional Professional Consult (permissible substitution) (INDEPENDENT_AMBULATORY_CARE_PROVIDER_SITE_OTHER)
